# Patient Record
Sex: Female | Born: 1960 | Race: Black or African American | Hispanic: No | Smoking: Current every day smoker
Health system: Southern US, Community
[De-identification: ages and names within clinical notes are randomized; demographics above are authoritative.]

## PROBLEM LIST (undated history)

## (undated) DIAGNOSIS — J449 Chronic obstructive pulmonary disease, unspecified: Secondary | ICD-10-CM

## (undated) DIAGNOSIS — F99 Mental disorder, not otherwise specified: Secondary | ICD-10-CM

## (undated) DIAGNOSIS — I10 Essential (primary) hypertension: Secondary | ICD-10-CM

## (undated) DIAGNOSIS — F419 Anxiety disorder, unspecified: Secondary | ICD-10-CM

## (undated) DIAGNOSIS — I341 Nonrheumatic mitral (valve) prolapse: Secondary | ICD-10-CM

## (undated) DIAGNOSIS — M797 Fibromyalgia: Secondary | ICD-10-CM

## (undated) DIAGNOSIS — N83209 Unspecified ovarian cyst, unspecified side: Secondary | ICD-10-CM

## (undated) DIAGNOSIS — Z8719 Personal history of other diseases of the digestive system: Secondary | ICD-10-CM

## (undated) DIAGNOSIS — M1990 Unspecified osteoarthritis, unspecified site: Secondary | ICD-10-CM

## (undated) DIAGNOSIS — K219 Gastro-esophageal reflux disease without esophagitis: Secondary | ICD-10-CM

## (undated) DIAGNOSIS — E119 Type 2 diabetes mellitus without complications: Secondary | ICD-10-CM

## (undated) DIAGNOSIS — F32A Depression, unspecified: Secondary | ICD-10-CM

## (undated) DIAGNOSIS — F329 Major depressive disorder, single episode, unspecified: Secondary | ICD-10-CM

## (undated) DIAGNOSIS — E0590 Thyrotoxicosis, unspecified without thyrotoxic crisis or storm: Secondary | ICD-10-CM

## (undated) DIAGNOSIS — D219 Benign neoplasm of connective and other soft tissue, unspecified: Secondary | ICD-10-CM

## (undated) DIAGNOSIS — Z9889 Other specified postprocedural states: Secondary | ICD-10-CM

## (undated) DIAGNOSIS — D649 Anemia, unspecified: Secondary | ICD-10-CM

## (undated) DIAGNOSIS — R519 Headache, unspecified: Secondary | ICD-10-CM

## (undated) DIAGNOSIS — R51 Headache: Secondary | ICD-10-CM

## (undated) HISTORY — PX: NOSE SURGERY: SHX723

## (undated) HISTORY — PX: FOOT SURGERY: SHX648

## (undated) HISTORY — PX: ABDOMINAL HYSTERECTOMY: SHX81

## (undated) HISTORY — PX: THERAPEUTIC ABORTION: SHX798

## (undated) HISTORY — PX: HERNIA REPAIR: SHX51

## (undated) HISTORY — PX: BREAST SURGERY: SHX581

---

## 2007-03-24 DIAGNOSIS — Z8719 Personal history of other diseases of the digestive system: Secondary | ICD-10-CM

## 2007-03-24 HISTORY — DX: Personal history of other diseases of the digestive system: Z87.19

## 2016-10-12 DIAGNOSIS — I152 Hypertension secondary to endocrine disorders: Secondary | ICD-10-CM | POA: Insufficient documentation

## 2016-10-12 DIAGNOSIS — E119 Type 2 diabetes mellitus without complications: Secondary | ICD-10-CM | POA: Insufficient documentation

## 2016-10-12 DIAGNOSIS — F329 Major depressive disorder, single episode, unspecified: Secondary | ICD-10-CM | POA: Insufficient documentation

## 2016-10-12 DIAGNOSIS — F32A Depression, unspecified: Secondary | ICD-10-CM | POA: Insufficient documentation

## 2016-10-12 DIAGNOSIS — E1159 Type 2 diabetes mellitus with other circulatory complications: Secondary | ICD-10-CM | POA: Insufficient documentation

## 2016-10-12 DIAGNOSIS — F419 Anxiety disorder, unspecified: Secondary | ICD-10-CM | POA: Insufficient documentation

## 2016-10-12 DIAGNOSIS — F4310 Post-traumatic stress disorder, unspecified: Secondary | ICD-10-CM | POA: Insufficient documentation

## 2016-10-26 ENCOUNTER — Encounter (HOSPITAL_COMMUNITY): Payer: Self-pay | Admitting: *Deleted

## 2016-10-26 ENCOUNTER — Inpatient Hospital Stay (HOSPITAL_COMMUNITY): Payer: Medicare PPO

## 2016-10-26 ENCOUNTER — Inpatient Hospital Stay (HOSPITAL_COMMUNITY)
Admission: AD | Admit: 2016-10-26 | Discharge: 2016-10-26 | Disposition: A | Discharge status: Home / Self Care | Payer: Medicare PPO | Source: Ambulatory Visit | Attending: Obstetrics & Gynecology | Admitting: Obstetrics & Gynecology

## 2016-10-26 ENCOUNTER — Other Ambulatory Visit: Payer: Self-pay | Admitting: Obstetrics and Gynecology

## 2016-10-26 DIAGNOSIS — R109 Unspecified abdominal pain: Secondary | ICD-10-CM | POA: Diagnosis present

## 2016-10-26 DIAGNOSIS — N760 Acute vaginitis: Secondary | ICD-10-CM

## 2016-10-26 DIAGNOSIS — B9689 Other specified bacterial agents as the cause of diseases classified elsewhere: Secondary | ICD-10-CM

## 2016-10-26 DIAGNOSIS — F17210 Nicotine dependence, cigarettes, uncomplicated: Secondary | ICD-10-CM | POA: Diagnosis not present

## 2016-10-26 DIAGNOSIS — D259 Leiomyoma of uterus, unspecified: Secondary | ICD-10-CM | POA: Diagnosis present

## 2016-10-26 HISTORY — DX: Thyrotoxicosis, unspecified without thyrotoxic crisis or storm: E05.90

## 2016-10-26 HISTORY — DX: Mental disorder, not otherwise specified: F99

## 2016-10-26 HISTORY — DX: Unspecified ovarian cyst, unspecified side: N83.209

## 2016-10-26 HISTORY — DX: Benign neoplasm of connective and other soft tissue, unspecified: D21.9

## 2016-10-26 HISTORY — DX: Type 2 diabetes mellitus without complications: E11.9

## 2016-10-26 LAB — WET PREP, GENITAL
SPERM: NONE SEEN
TRICH WET PREP: NONE SEEN
YEAST WET PREP: NONE SEEN

## 2016-10-26 LAB — CBC
HCT: 29.1 % — ABNORMAL LOW (ref 36.0–46.0)
HEMOGLOBIN: 9.1 g/dL — AB (ref 12.0–15.0)
MCH: 22.1 pg — AB (ref 26.0–34.0)
MCHC: 31.3 g/dL (ref 30.0–36.0)
MCV: 70.8 fL — ABNORMAL LOW (ref 78.0–100.0)
Platelets: 142 10*3/uL — ABNORMAL LOW (ref 150–400)
RBC: 4.11 MIL/uL (ref 3.87–5.11)
RDW: 18.4 % — ABNORMAL HIGH (ref 11.5–15.5)
WBC: 9.3 10*3/uL (ref 4.0–10.5)

## 2016-10-26 LAB — URINALYSIS, ROUTINE W REFLEX MICROSCOPIC
Bilirubin Urine: NEGATIVE
Glucose, UA: NEGATIVE mg/dL
Hgb urine dipstick: NEGATIVE
KETONES UR: NEGATIVE mg/dL
LEUKOCYTES UA: NEGATIVE
NITRITE: NEGATIVE
PH: 5 (ref 5.0–8.0)
PROTEIN: NEGATIVE mg/dL
Specific Gravity, Urine: 1.023 (ref 1.005–1.030)

## 2016-10-26 MED ORDER — PROMETHAZINE HCL 12.5 MG PO TABS: 12.5000 mg | ORAL_TABLET | Freq: Four times a day (QID) | ORAL | 0 refills | Status: DC | PRN

## 2016-10-26 MED ORDER — HYDROMORPHONE HCL 1 MG/ML IJ SOLN
1.0000 mg | Freq: Once | INTRAMUSCULAR | Status: AC
  Administered 2016-10-26: 1 mg via INTRAMUSCULAR
  Filled 2016-10-26: qty 1

## 2016-10-26 MED ORDER — HYDROMORPHONE HCL 2 MG PO TABS: 2.0000 mg | ORAL_TABLET | Freq: Two times a day (BID) | ORAL | 0 refills | Status: DC | PRN

## 2016-10-26 MED ORDER — KETOROLAC TROMETHAMINE 60 MG/2ML IM SOLN
60.0000 mg | Freq: Once | INTRAMUSCULAR | Status: AC
  Administered 2016-10-26: 60 mg via INTRAMUSCULAR
  Filled 2016-10-26: qty 2

## 2016-10-26 NOTE — MAU Note (Signed)
Pt C/O constipation & then diarrhea that started last Thursday.  Was seen on [facility] on Friday, had CT scan - dx'd with fibroid tumors & ovarian cysts.  Was given pain meds & sent home, is still in excruciating pain.  Has appt with Dr. Julien Girt on August 30.  Denies bleeding.  Took one norco this morning with no relief.

## 2016-10-26 NOTE — MAU Provider Note (Signed)
History     CSN: [id number]  Arrival date and time: 10/26/16 1623   First Provider Initiated Contact with Patient 10/26/16 1757      Chief Complaint  Patient presents with  . Abdominal Pain   HPI   Ms.Sandra Bean is a 56 y.o. post menopausal female G3P0030 here in MAU with abdominal pain. LMP 2014.  She was seen at [facility] Friday for abdominal pain, and was told she has fibroid tumors and cysts on her ovaries. She had a CT scan only and states "they gave me minimal information". She was given Norco for pain and states its helping only minimally. Her last dose was Norco was 5:00 this evening. She took one tablet. She currently rates her pain 10/10. The pain is all over her abdomen, her abdomen is distended. She feels pressure in her pelvis.   She has an appointment scheduled with Dr. Julien Girt at the end of the month. She has never been to their office.   OB History    Gravida Para Term Preterm AB Living   3       3     SAB TAB Ectopic Multiple Live Births     3            Past Medical History:  Diagnosis Date  . Diabetes mellitus without complication (HCC)    Type 2  . Fibroids   . Hyperthyroidism   . Mental disorder   . Ovarian cyst     Past Surgical History:  Procedure Laterality Date  . FOOT SURGERY    . HERNIA REPAIR    . NOSE SURGERY    . THERAPEUTIC ABORTION      History reviewed. No pertinent family history.  Social History  Substance Use Topics  . Smoking status: Current Every Day Smoker    Packs/day: 0.50    Types: Cigarettes  . Smokeless tobacco: Never Used  . Alcohol use No    Allergies: No Known Allergies  No prescriptions prior to admission.   Results for orders placed or performed during the hospital encounter of 10/26/16 (from the past 48 hour(s))  Urinalysis, Routine w reflex microscopic     Status: None   Collection Time: 10/26/16  4:45 PM  Result Value Ref Range   Color, Urine YELLOW YELLOW   APPearance CLEAR CLEAR   Specific Gravity,  Urine 1.023 1.005 - 1.030   pH 5.0 5.0 - 8.0   Glucose, UA NEGATIVE NEGATIVE mg/dL   Hgb urine dipstick NEGATIVE NEGATIVE   Bilirubin Urine NEGATIVE NEGATIVE   Ketones, ur NEGATIVE NEGATIVE mg/dL   Protein, ur NEGATIVE NEGATIVE mg/dL   Nitrite NEGATIVE NEGATIVE   Leukocytes, UA NEGATIVE NEGATIVE  CBC     Status: Abnormal   Collection Time: 10/26/16  6:22 PM  Result Value Ref Range   WBC 9.3 4.0 - 10.5 K/uL   RBC 4.11 3.87 - 5.11 MIL/uL   Hemoglobin 9.1 (L) 12.0 - 15.0 g/dL   HCT 29.1 (L) 36.0 - 46.0 %   MCV 70.8 (L) 78.0 - 100.0 fL   MCH 22.1 (L) 26.0 - 34.0 pg   MCHC 31.3 30.0 - 36.0 g/dL   RDW 18.4 (H) 11.5 - 15.5 %   Platelets 142 (L) 150 - 400 K/uL    Comment: REPEATED TO VERIFY SPECIMEN CHECKED FOR CLOTS   Wet prep, genital     Status: Abnormal   Collection Time: 10/26/16  6:58 PM  Result Value Ref Range   Yeast  Wet Prep HPF POC NONE SEEN NONE SEEN   Trich, Wet Prep NONE SEEN NONE SEEN   Clue Cells Wet Prep HPF POC PRESENT (A) NONE SEEN   WBC, Wet Prep HPF POC FEW (A) NONE SEEN    Comment: FEW BACTERIA SEEN Specimen diluted due to transport tube containing more than 1 ml of saline, interpret results with caution.    Sperm NONE SEEN    Review of Systems  Constitutional: Negative for fever.  Gastrointestinal: Positive for abdominal pain.  Genitourinary: Positive for pelvic pain. Negative for dysuria and vaginal bleeding.   Physical Exam   Blood pressure 137/73, pulse 80, temperature 97.7 F (36.5 C), temperature source Oral, resp. rate 18, height 5\' 6"  (1.676 m), weight 176 lb (79.8 kg).  Physical Exam  Constitutional: She is oriented to person, place, and time. She appears well-developed and well-nourished.  Non-toxic appearance. She does not have a sickly appearance. She does not appear ill. No distress.  GI: Soft. She exhibits distension and mass. There is generalized tenderness.  Genitourinary:  Genitourinary Comments: Vagina - Small amount of white vaginal  discharge, no odor  Cervix - No contact bleeding, no active bleeding  Bimanual exam: Cervix closed, no CMT  Uterine tenderness, + enlarged  Adnexa non tender, no masses bilaterally GC/Chlam, wet prep done Chaperone present for exam.   Musculoskeletal: Normal range of motion.  Neurological: She is alert and oriented to person, place, and time.  Skin: Skin is warm. She is not diaphoretic.  Psychiatric: Her behavior is normal.   MAU Course  Procedures  None  MDM  CBC with diff Dilaudid 1 mg IM & Toradol 60 mg IM  Pelvic US ordered; patient rating her pain 10/10 Report given to [place] who resumes care of the patient.  Noni Saupe I, NP  Results for orders placed or performed during the hospital encounter of 10/26/16 (from the past 24 hour(s))  Urinalysis, Routine w reflex microscopic     Status: None   Collection Time: 10/26/16  4:45 PM  Result Value Ref Range   Color, Urine YELLOW YELLOW   APPearance CLEAR CLEAR   Specific Gravity, Urine 1.023 1.005 - 1.030   pH 5.0 5.0 - 8.0   Glucose, UA NEGATIVE NEGATIVE mg/dL   Hgb urine dipstick NEGATIVE NEGATIVE   Bilirubin Urine NEGATIVE NEGATIVE   Ketones, ur NEGATIVE NEGATIVE mg/dL   Protein, ur NEGATIVE NEGATIVE mg/dL   Nitrite NEGATIVE NEGATIVE   Leukocytes, UA NEGATIVE NEGATIVE  CBC     Status: Abnormal   Collection Time: 10/26/16  6:22 PM  Result Value Ref Range   WBC 9.3 4.0 - 10.5 K/uL   RBC 4.11 3.87 - 5.11 MIL/uL   Hemoglobin 9.1 (L) 12.0 - 15.0 g/dL   HCT 29.1 (L) 36.0 - 46.0 %   MCV 70.8 (L) 78.0 - 100.0 fL   MCH 22.1 (L) 26.0 - 34.0 pg   MCHC 31.3 30.0 - 36.0 g/dL   RDW 18.4 (H) 11.5 - 15.5 %   Platelets 142 (L) 150 - 400 K/uL  Wet prep, genital     Status: Abnormal   Collection Time: 10/26/16  6:58 PM  Result Value Ref Range   Yeast Wet Prep HPF POC NONE SEEN NONE SEEN   Trich, Wet Prep NONE SEEN NONE SEEN   Clue Cells Wet Prep HPF POC PRESENT (A) NONE SEEN   WBC, Wet Prep HPF POC FEW (A) NONE SEEN    Sperm NONE SEEN  US Transvaginal Non-ob  Result Date: 10/26/2016 CLINICAL DATA:  Pelvic pain.  Uterine fibroids. EXAM: TRANSABDOMINAL AND TRANSVAGINAL ULTRASOUND OF PELVIS TECHNIQUE: Both transabdominal and transvaginal ultrasound examinations of the pelvis were performed. Transabdominal technique was performed for global imaging of the pelvis including uterus, ovaries, adnexal regions, and pelvic cul-de-sac. It was necessary to proceed with endovaginal exam following the transabdominal exam to visualize the ovaries. COMPARISON:  None. FINDINGS: Uterus Measurements: 11.6 x 7.9 x 6.6 cm. Two large uterine fibroids are noted, with the largest measuring 8.5 x 7.3 x 6.6 cm arising from the fundus. The other fibroid measures 6.1 x 5.3 x 4.9 cm in the lower uterine segment. Endometrium Could not be visualized due to large uterine fibroids. Right ovary Not visualized. Left ovary Not visualized. Other findings Small amount of free fluid is noted which most likely is physiologic. IMPRESSION: Large uterine fibroids are noted, with the largest measuring 8.5 cm arising from the fundus. Endometrium and ovaries are not visualized. Electronically Signed   By: Marijo Conception, M.D.   On: 10/26/2016 19:56   US Pelvis Complete  Result Date: 10/26/2016 CLINICAL DATA:  Pelvic pain.  Uterine fibroids. EXAM: TRANSABDOMINAL AND TRANSVAGINAL ULTRASOUND OF PELVIS TECHNIQUE: Both transabdominal and transvaginal ultrasound examinations of the pelvis were performed. Transabdominal technique was performed for global imaging of the pelvis including uterus, ovaries, adnexal regions, and pelvic cul-de-sac. It was necessary to proceed with endovaginal exam following the transabdominal exam to visualize the ovaries. COMPARISON:  None. FINDINGS: Uterus Measurements: 11.6 x 7.9 x 6.6 cm. Two large uterine fibroids are noted, with the largest measuring 8.5 x 7.3 x 6.6 cm arising from the fundus. The other fibroid measures 6.1 x 5.3 x 4.9 cm  in the lower uterine segment. Endometrium Could not be visualized due to large uterine fibroids. Right ovary Not visualized. Left ovary Not visualized. Other findings Small amount of free fluid is noted which most likely is physiologic. IMPRESSION: Large uterine fibroids are noted, with the largest measuring 8.5 cm arising from the fundus. Endometrium and ovaries are not visualized. Electronically Signed   By: Marijo Conception, M.D.   On: 10/26/2016 19:56   Patient able to tolerate crackers and chicken and rice soup prior to d/c home  Assessment and Plan  Abdominal pain in female - Rx for Dilaudid 2 mg po every 12 hrs - Rx Phenergan 12.5 mg po every 6 hrs  Uterine leiomyoma, unspecified location - Message sent to [place] to get scheduled sooner than 8/30 - If unable to get sooner appt, pt to decide of care provider (Dr. Julien Girt or [facility] MD) - Advised to return here for worsening sx's  Discharge home Patient verbalized an understanding of the plan of care and agrees.  Laury Deep, CNM 10/26/2016, 9:16 PM

## 2016-10-27 ENCOUNTER — Telehealth: Payer: Self-pay | Admitting: General Practice

## 2016-10-27 LAB — GC/CHLAMYDIA PROBE AMP (~~LOC~~) NOT AT ARMC
Chlamydia: NEGATIVE
Neisseria Gonorrhea: NEGATIVE

## 2016-10-27 LAB — HIV ANTIBODY (ROUTINE TESTING W REFLEX): HIV Screen 4th Generation wRfx: NONREACTIVE

## 2016-10-27 MED ORDER — METRONIDAZOLE 500 MG PO TABS: 500.0000 mg | ORAL_TABLET | Freq: Two times a day (BID) | ORAL | 0 refills | Status: DC

## 2016-10-27 NOTE — Telephone Encounter (Signed)
Med called in to [place]. Called patient, no answer- left message stating we are trying to reach you to inform you of a needed prescription sent to your [facility] pharmacy, you may call us back if you have questions

## 2016-10-27 NOTE — Telephone Encounter (Signed)
-----   Message from Laury Deep, [place] sent at 10/26/2016 10:25 PM EDT ----- Regarding: Needs Rx for Flagyl This patient will be coming to see Dr. Ihor Dow on 10/29/16.  Please make sure she gets a Rx for Flagyl sent.  I was so focused on sending Rx for her pain, I missed that Rx.  It would not let me e-prescribe the Rx. Thanks!

## 2016-10-29 ENCOUNTER — Other Ambulatory Visit (HOSPITAL_COMMUNITY)
Admission: RE | Admit: 2016-10-29 | Discharge: 2016-10-29 | Disposition: A | Discharge status: Home / Self Care | Payer: Medicare PPO | Source: Ambulatory Visit | Attending: Obstetrics & Gynecology | Admitting: Obstetrics & Gynecology

## 2016-10-29 ENCOUNTER — Ambulatory Visit: Payer: Self-pay | Admitting: Clinical

## 2016-10-29 ENCOUNTER — Encounter: Payer: Self-pay | Admitting: Obstetrics & Gynecology

## 2016-10-29 ENCOUNTER — Ambulatory Visit (INDEPENDENT_AMBULATORY_CARE_PROVIDER_SITE_OTHER): Payer: Medicare PPO | Admitting: Obstetrics & Gynecology

## 2016-10-29 ENCOUNTER — Encounter (HOSPITAL_COMMUNITY): Payer: Self-pay

## 2016-10-29 VITALS — BP 137/99 | HR 74 | Wt 176.1 lb

## 2016-10-29 DIAGNOSIS — D250 Submucous leiomyoma of uterus: Secondary | ICD-10-CM | POA: Diagnosis not present

## 2016-10-29 DIAGNOSIS — Z1151 Encounter for screening for human papillomavirus (HPV): Secondary | ICD-10-CM | POA: Diagnosis not present

## 2016-10-29 DIAGNOSIS — Z124 Encounter for screening for malignant neoplasm of cervix: Secondary | ICD-10-CM | POA: Diagnosis not present

## 2016-10-29 DIAGNOSIS — R102 Pelvic and perineal pain: Secondary | ICD-10-CM | POA: Diagnosis not present

## 2016-10-29 DIAGNOSIS — Z01419 Encounter for gynecological examination (general) (routine) without abnormal findings: Secondary | ICD-10-CM

## 2016-10-29 DIAGNOSIS — Z029 Encounter for administrative examinations, unspecified: Secondary | ICD-10-CM

## 2016-10-29 DIAGNOSIS — Z113 Encounter for screening for infections with a predominantly sexual mode of transmission: Secondary | ICD-10-CM

## 2016-10-29 DIAGNOSIS — D252 Subserosal leiomyoma of uterus: Secondary | ICD-10-CM

## 2016-10-29 DIAGNOSIS — F3289 Other specified depressive episodes: Secondary | ICD-10-CM

## 2016-10-29 DIAGNOSIS — F39 Unspecified mood [affective] disorder: Secondary | ICD-10-CM

## 2016-10-29 NOTE — BH Specialist Note (Signed)
Integrated Behavioral Health Initial Visit  MRN: [id number] Name: Sandra Bean   Session Start time: 3:05 Session End time: 3:20 Total time: 15 minutes  Type of Service: [place] Interpretor:No. Interpretor Name and Language: n/a   Warm Hand Off Completed.       SUBJECTIVE: Sandra Bean is a 56 y.o. female accompanied by patient and adult neice. Patient was referred by Dr Ihor Dow for mood disorder, untreated. Patient reports the following symptoms/concerns: Pt states that her primary concern today is the need to find a local therapist, who will work with her new psychiatrist;also finds sound of the ocean soothing. Duration of problem: Four months; Severity of problem: moderate  OBJECTIVE: Mood: Anxious and Affect: Appropriate Risk of harm to self or others: No plan to harm self or others   LIFE CONTEXT: Family and Social: Lives with her adult niece School/Work: - Self-Care: Self-advocacy Life Changes: Move from [place] to [place] about 4 months prior  GOALS ADDRESSED: Patient will reduce symptoms of: mood instability and increase knowledge and/or ability of: stress reduction and also: Increase healthy adjustment to current life circumstances   INTERVENTIONS: Supportive Counseling and Psychoeducation and/or Health Education  Standardized Assessments completed: GAD-7 and PHQ 9, pt states no SI today  ASSESSMENT: Patient currently experiencing Mood disorder. Patient may benefit from psychoeducation and supportive counseling today.  PLAN: 1. Follow up with behavioral health clinician on : As needed 2. Behavioral recommendations:  -Go to psychiatry appointment with [facility] on 11-16-16; psychiatrist will give new therapist referral at psychiatry appointment -[place] on phone today for ocean sound; niece will help, if needed -Consider emergency numbers provided to patient and niece, if needed  prior to psychiatry visit 3. Referral(s): [facility] (In Clinic) 4. "From scale of 1-10, how likely are you to follow plan?": 9  Garlan Fair, LCSWA  Depression screen [facility] 2/9 10/29/2016  Decreased Interest 3  Down, Depressed, Hopeless 3  PHQ - 2 Score 6  Altered sleeping 3  Tired, decreased energy 3  Change in appetite 3  Feeling bad or failure about yourself  3  Trouble concentrating 3  Moving slowly or fidgety/restless 2  Suicidal thoughts 1  PHQ-9 Score 24   GAD 7 : Generalized Anxiety Score 10/29/2016  Nervous, Anxious, on Edge 3  Control/stop worrying 3  Worry too much - different things 3  Trouble relaxing 3  Restless 3  Easily annoyed or irritable 2  Afraid - awful might happen 3  Total GAD 7 Score 20

## 2016-10-29 NOTE — Progress Notes (Signed)
Subjective:     Sandra Bean is a 56 y.o. female here for a routine exam.  G3P0030 LMP 2013 Current complaints: pain.  Pt reports pain beginning  6 days prev. Pt reports diarrhea the night.Prior to the pain. Pt with a h/o pancreatitis.  She reports that it greatly limits her activity. She was in the ED x2. She was seen at [facility] ED on Fri night and then at the MAU on Monday. She was taking Narcs with no relief.  No fevers. NO chills. The diarrhea is resolved. Pt reports a decreased appetite. Have been eating 'lightweight'  She is not currently sexually active and has not been in over 2 years.  She denies fever or chills.  Pt reports being on disability due to major depression. She is looking to getting to see a psychiatrist and a therapist. Would like to see the behavior health specialist prior to leaving today.   She is here today with her niece  No healthcare unless absolutely necessary from 2016-2018 due to depression.   Gynecologic History No LMP recorded. Patient is postmenopausal. Contraception: post menopausal status Last Pap: 2016. Results were: normal. h/o abnormal PAP ->10years s/p cryo.  Last mammogram: 2016. Results were: normal  Obstetric History OB History  Gravida Para Term Preterm AB Living  3       3    SAB TAB Ectopic Multiple Live Births    3          # Outcome Date GA Lbr Len/2nd Weight Sex Delivery Anes PTL Lv  3 TAB           2 TAB           1 TAB              The following portions of the patient's history were reviewed and updated as appropriate: allergies, current medications, past family history, past medical history, past social history, past surgical history and problem list.  Review of Systems Pertinent items are noted in HPI.    Objective:  BP (!) 137/99   Pulse 74   Wt 176 lb 1.6 oz (79.9 kg)   BMI 28.42 kg/m  General Appearance:    Alert, cooperative, no distress, appears stated age  Head:    Normocephalic, without obvious abnormality,  atraumatic  Eyes:    conjunctiva/corneas clear, EOM's intact, both eyes  Ears:    Normal external ear canals, both ears  Nose:   Nares normal, septum midline, mucosa normal, no drainage    or sinus tenderness  Throat:   Lips, mucosa, and tongue normal; teeth and gums normal  Neck:   Supple, symmetrical, trachea midline, no adenopathy;    thyroid:  no enlargement/tenderness/nodules  Back:     Symmetric, no curvature, ROM normal, no CVA tenderness  Lungs:     Clear to auscultation bilaterally, respirations unlabored  Chest Wall:    No tenderness or deformity   Heart:    Regular rate and rhythm, S1 and S2 normal, no murmur, rub   or gallop  Breast Exam:    No tenderness, masses, or nipple abnormality  Abdomen:     Soft, tender diffusely- no rebound, bowel sounds active all four quadrants,    no masses, no organomegaly  Genitalia:    Normal female without lesion or discharge. There is uterine tenderness which is marked over the fibroids that I felt mail ny right lower uterine segment. No adnexal masses or tenderness     Extremities:  Extremities normal, atraumatic, no cyanosis or edema  Pulses:   2+ and symmetric all extremities  Skin:   Skin color, texture, turgor normal, no rashes or lesions   10/26/2016 CLINICAL DATA:  Pelvic pain.  Uterine fibroids.  EXAM: TRANSABDOMINAL AND TRANSVAGINAL ULTRASOUND OF PELVIS  TECHNIQUE: Both transabdominal and transvaginal ultrasound examinations of the pelvis were performed. Transabdominal technique was performed for global imaging of the pelvis including uterus, ovaries, adnexal regions, and pelvic cul-de-sac. It was necessary to proceed with endovaginal exam following the transabdominal exam to visualize the ovaries.  COMPARISON:  None.  FINDINGS: Uterus  Measurements: 11.6 x 7.9 x 6.6 cm. Two large uterine fibroids are noted, with the largest measuring 8.5 x 7.3 x 6.6 cm arising from the fundus. The other fibroid measures 6.1 x 5.3 x  4.9 cm in the lower uterine segment.  Endometrium  Could not be visualized due to large uterine fibroids.  Right ovary  Not visualized.  Left ovary  Not visualized.  Other findings  Small amount of free fluid is noted which most likely is physiologic.  IMPRESSION: Large uterine fibroids are noted, with the largest measuring 8.5 cm arising from the fundus. Endometrium and ovaries are not visualized.  Assessment:    Healthy female exam.   BV (dx'd in the MAU- pt did not take meds) Uterine fibroids Pelvic pain- suspect degerating fibroids. Pt declines conservative management. Requests definitive tx     Plan:   F/u PAP Flagyl 500mg  bid x7 days (pt will pick up the Rx) May have a refill for Dilaudid po (pt did not get Rx before leaving office) will send a note to nursing to call her to pick up rx.  Patient desires surgical management with RATH with BSO.  The risks of surgery were discussed in detail with the patient including but not limited to: bleeding which may require transfusion or reoperation; infection which may require prolonged hospitalization or re-hospitalization and antibiotic therapy; injury to bowel, bladder, ureters and major vessels or other surrounding organs; need for additional procedures including laparotomy; thromboembolic phenomenon, incisional problems and other postoperative or anesthesia complications.  Patient was told that the likelihood that her condition and symptoms will be treated effectively with this surgical management was very high; the postoperative expectations were also discussed in detail. The patient also understands the alternative treatment options which were discussed in full. All questions were answered.  She was told that she will be contacted by our surgical scheduler regarding the time and date of her surgery; routine preoperative instructions of having nothing to eat or drink after midnight on the day prior to surgery and also  coming to the hospital 1 1/2 hours prior to her time of surgery were also emphasized.  She was told she may be called for a preoperative appointment about a week prior to surgery and will be given further preoperative instructions at that visit. Printed patient education handouts about the procedure were given to the patient to review at home.  She was asked to f/u to discuss the surgery prior to the date of the procedure.   Total face-to-face time with patient was 50  min.  Greater than 50% was spent in counseling and coordination of care with the patient.    Velna Hedgecock L. Harraway-Smith, M.D., [name]

## 2016-10-29 NOTE — Patient Instructions (Signed)
Uterine Fibroids Uterine fibroids are tissue masses (tumors) that can develop in the womb (uterus). They are also called leiomyomas. This type of tumor is not cancerous (benign) and does not spread to other parts of the body outside of the pelvic area, which is between the hip bones. Occasionally, fibroids may develop in the fallopian tubes, in the cervix, or on the support structures (ligaments) that surround the uterus. You can have one or many fibroids. Fibroids can vary in size, weight, and where they grow in the uterus. Some can become quite large. Most fibroids do not require medical treatment. What are the causes? A fibroid can develop when a single uterine cell keeps growing (replicating). Most cells in the human body have a control mechanism that keeps them from replicating without control. What are the signs or symptoms? Symptoms may include:  Heavy bleeding during your period.  Bleeding or spotting between periods.  Pelvic pain and pressure.  Bladder problems, such as needing to urinate more often (urinary frequency) or urgently.  Inability to reproduce offspring (infertility).  Miscarriages.  How is this diagnosed? Uterine fibroids are diagnosed through a physical exam. Your health care provider may feel the lumpy tumors during a pelvic exam. Ultrasonography and an MRI may be done to determine the size, location, and number of fibroids. How is this treated? Treatment may include:  Watchful waiting. This involves getting the fibroid checked by your health care provider to see if it grows or shrinks. Follow your health care provider's recommendations for how often to have this checked.  Hormone medicines. These can be taken by mouth or given through an intrauterine device (IUD).  Surgery. ? Removing the fibroids (myomectomy) or the uterus (hysterectomy). ? Removing blood supply to the fibroids (uterine artery embolization).  If fibroids interfere with your fertility and you  want to become pregnant, your health care provider may recommend having the fibroids removed. Follow these instructions at home:  Keep all follow-up visits as directed by your health care provider. This is important.  Take over-the-counter and prescription medicines only as told by your health care provider. ? If you were prescribed a hormone treatment, take the hormone medicines exactly as directed.  Ask your health care provider about taking iron pills and increasing the amount of dark green, leafy vegetables in your diet. These actions can help to boost your blood iron levels, which may be affected by heavy menstrual bleeding.  Pay close attention to your period and tell your health care provider about any changes, such as: ? Increased blood flow that requires you to use more pads or tampons than usual per month. ? A change in the number of days that your period lasts per month. ? A change in symptoms that are associated with your period, such as abdominal cramping or back pain. Contact a health care provider if:  You have pelvic pain, back pain, or abdominal cramps that cannot be controlled with medicines.  You have an increase in bleeding between and during periods.  You soak tampons or pads in a half hour or less.  You feel lightheaded, extra tired, or weak. Get help right away if:  You faint.  You have a sudden increase in pelvic pain. This information is not intended to replace advice given to you by your health care provider. Make sure you discuss any questions you have with your health care provider. Document Released: 03/06/2000 Document Revised: 11/07/2015 Document Reviewed: 09/05/2013 Elsevier Interactive Patient Education  2018 [employer]  

## 2016-10-29 NOTE — Progress Notes (Signed)
Unsure of last pap smear

## 2016-10-30 ENCOUNTER — Encounter: Payer: Self-pay | Admitting: Obstetrics & Gynecology

## 2016-11-05 ENCOUNTER — Telehealth: Payer: Self-pay | Admitting: Obstetrics & Gynecology

## 2016-11-05 NOTE — Telephone Encounter (Signed)
Patient said she can't come pick her RX up today, also want a work note, She have a few questions for the nurse.

## 2016-11-06 ENCOUNTER — Telehealth: Payer: Self-pay | Admitting: Lab

## 2016-11-06 LAB — CYTOLOGY - PAP
CHLAMYDIA, DNA PROBE: NEGATIVE
Diagnosis: NEGATIVE
HPV: NOT DETECTED
NEISSERIA GONORRHEA: NEGATIVE

## 2016-11-10 NOTE — Telephone Encounter (Signed)
Error

## 2016-11-10 NOTE — Telephone Encounter (Addendum)
Pt called today to discuss her concerns. She stated that she works @ [employer] and her job is very physical.  She is required to [employer] - sometimes having to sit on the floor, lift and carry heavy boxes and stand on her feet for long periods of time. She further states that she is not able to perform any of these job duties without experiencing significant pain and discomfort. The level of pain while working would require her to need Dilaudid. She cannot take Dilaudid and be able to work safely. she is scheduled for surgery on 01/19/17.  She is requesting a letter from Dr. Ihor Dow for her job which states that she is unable to work now due to the pain, that she will be having surgery on 01/19/17 and continue to be out of work for 6 weeks for recovery. Pt also stated that she would need FMLA papers completed. I asked if pt could request to be transferred to a different department @ work which would not have such physical responsibilities. Pt stated, "That's not how [employer] works. They are not very caring to their employees."  I advised pt that I did not expect Dr. Ihor Dow to authorize a medical leave prior to surgery for the next 2 months. Pt then stated, "She saw how much pain I was having when I was there for the appointment. She can call me if she needs to discuss it further with me".  I informed pt that I will send a message to Dr. Ihor Dow and that she is out of the office until next week. Pt agreed and voiced understanding.   8/27  1405  Called pt and left message stating that I have had a response from the doctor regarding our previous conversation. Please call back and leave a message stating whether a detailed message can be left on her voice mail. She can speak with any of the nurses. Per Dr. Ihor Dow, she cannot remove her from work for 2 months until the surgery date. We can fill out her papers asking for light duty. It is up to her job to accommodate based on the  information we give them.

## 2016-11-20 NOTE — Telephone Encounter (Signed)
Called patient and she states that someone already called her back and discussed it with her. Patient states she is just going to have to leave her job and find another one after all this because she cannot work. Patient asked if Dr Ihor Dow was going to remove her cervix. Told patient I wasn't sure but she could ask Dr Ihor Dow the day of surgery. Patient verbalized understanding & had no questions

## 2016-12-08 ENCOUNTER — Telehealth: Payer: Self-pay | Admitting: *Deleted

## 2016-12-08 NOTE — Telephone Encounter (Signed)
Pt called and requested a letter for light duty at work.

## 2016-12-09 NOTE — Telephone Encounter (Signed)
Contacted Dr. Ihor Dow and was recommended that pt can have a light duty work letter stating no lifting greater than 30 lbs, no stooping, and no bending. Notified pt that the light duty letter she requested has been faxed to [facility] personnel @ [phone].  Pt stated "thank you" with no further questions.

## 2016-12-10 ENCOUNTER — Telehealth: Payer: Self-pay | Admitting: *Deleted

## 2016-12-10 NOTE — Telephone Encounter (Signed)
-----   Message from Maurine Minister, [place] sent at 12/10/2016 11:23 AM EDT ----- Contact: [phone] Hi Ladies:  Sandra Bean stated that she was told that a prescription (pain meds) would be ready for her to pick up at the front desk.  Can someone please check on this and give patient a call back.  Thank you

## 2016-12-10 NOTE — Telephone Encounter (Signed)
I called Sandra Bean and left a message I was returning her call, please call our office if you are still having an issue.

## 2016-12-14 NOTE — Telephone Encounter (Signed)
Patient's job called and left message asking about work restrictions and when the end date was. Per chart review, patient hasn't signed ROI. Called patient & phone continues to ring then stops without voicemail.

## 2017-01-04 ENCOUNTER — Telehealth: Payer: Self-pay | Admitting: General Practice

## 2017-01-04 NOTE — Patient Instructions (Addendum)
Your procedure is scheduled on: Tuesday October 30,2018 at 1:30 pm  Enter through the Main Entrance of [facility] at:12:00 pm  Pick up the phone at the desk and dial [phone].  Call this number if you have problems the morning of surgery: [phone].  Remember: Do NOT eat food: after Midnight on Monday October 29 Do NOT drink clear liquids after: 7:30 am day of surgery Take these medicines the morning of surgery with a SIP OF WATER: AMILOPDINE,DESVENLAFAXINE CLONAZEPAM, METAPROPOL PRIST IQ, VOLTAREN, PRILOSEC  HOLD METFORMIN FOR 24 HOURS PRIOR TO SUGERY  STOP ASPIRIN ONE DAY PRIOR TO SURGERY  Do NOT wear jewelry (body piercing), metal hair clips/bobby pins, make-up, or nail polish. Do NOT wear lotions, powders, or perfumes.  You may wear deoderant. Do NOT shave for 48 hours prior to surgery. Do NOT bring valuables to the hospital. Contacts, dentures, or bridgework may not be worn into surgery.  Leave suitcase in car.  After surgery it may be brought to your room.   For patients admitted to the hospital, checkout time is 11:00 AM the day of discharge.

## 2017-01-04 NOTE — Telephone Encounter (Signed)
Patient called into front office and requested call back regarding fmla paperwork. Called patient and she states that [employer] disability needs an end date on her letter that was given. Told patient we cannot discuss that or her medical history without a written release of information. Told patient she will need to come by the office to fill that out and if we need to fill out FMLA paperwork for her surgery, we would need that release anyway for that. Patient verbalized understanding and states she will come by tomorrow for that. Patient had no questions

## 2017-01-06 ENCOUNTER — Encounter (HOSPITAL_COMMUNITY)
Admission: RE | Admit: 2017-01-06 | Discharge: 2017-01-06 | Disposition: A | Discharge status: Home / Self Care | Payer: Medicare PPO | Source: Ambulatory Visit | Attending: Obstetrics & Gynecology | Admitting: Obstetrics & Gynecology

## 2017-01-06 ENCOUNTER — Encounter (HOSPITAL_COMMUNITY): Payer: Self-pay

## 2017-01-06 ENCOUNTER — Other Ambulatory Visit: Payer: Self-pay

## 2017-01-06 DIAGNOSIS — R9431 Abnormal electrocardiogram [ECG] [EKG]: Secondary | ICD-10-CM | POA: Insufficient documentation

## 2017-01-06 DIAGNOSIS — Z01810 Encounter for preprocedural cardiovascular examination: Secondary | ICD-10-CM | POA: Insufficient documentation

## 2017-01-06 DIAGNOSIS — Z01812 Encounter for preprocedural laboratory examination: Secondary | ICD-10-CM | POA: Diagnosis present

## 2017-01-06 HISTORY — DX: Personal history of other diseases of the digestive system: Z87.19

## 2017-01-06 HISTORY — DX: Unspecified osteoarthritis, unspecified site: M19.90

## 2017-01-06 HISTORY — DX: Depression, unspecified: F32.A

## 2017-01-06 HISTORY — DX: Chronic obstructive pulmonary disease, unspecified: J44.9

## 2017-01-06 HISTORY — DX: Anemia, unspecified: D64.9

## 2017-01-06 HISTORY — DX: Headache, unspecified: R51.9

## 2017-01-06 HISTORY — DX: Other specified postprocedural states: Z98.890

## 2017-01-06 HISTORY — DX: Essential (primary) hypertension: I10

## 2017-01-06 HISTORY — DX: Fibromyalgia: M79.7

## 2017-01-06 HISTORY — DX: Gastro-esophageal reflux disease without esophagitis: K21.9

## 2017-01-06 HISTORY — DX: Major depressive disorder, single episode, unspecified: F32.9

## 2017-01-06 HISTORY — DX: Anxiety disorder, unspecified: F41.9

## 2017-01-06 HISTORY — DX: Headache: R51

## 2017-01-06 HISTORY — DX: Nonrheumatic mitral (valve) prolapse: I34.1

## 2017-01-06 LAB — CBC
HEMATOCRIT: 31.8 % — AB (ref 36.0–46.0)
Hemoglobin: 9.7 g/dL — ABNORMAL LOW (ref 12.0–15.0)
MCH: 21.3 pg — ABNORMAL LOW (ref 26.0–34.0)
MCHC: 30.5 g/dL (ref 30.0–36.0)
MCV: 69.9 fL — ABNORMAL LOW (ref 78.0–100.0)
PLATELETS: 159 10*3/uL (ref 150–400)
RBC: 4.55 MIL/uL (ref 3.87–5.11)
RDW: 18.4 % — AB (ref 11.5–15.5)
WBC: 4.7 10*3/uL (ref 4.0–10.5)

## 2017-01-06 LAB — ABO/RH: ABO/RH(D): O NEG

## 2017-01-06 LAB — PREPARE RBC (CROSSMATCH)

## 2017-01-06 NOTE — Pre-Procedure Instructions (Signed)
DR. FOSTER VIEWED AND OKAY' D EKG 

## 2017-01-19 ENCOUNTER — Encounter (HOSPITAL_COMMUNITY)
Admission: AD | Disposition: A | Discharge status: Home / Self Care | Payer: Self-pay | Source: Ambulatory Visit | Attending: Obstetrics & Gynecology

## 2017-01-19 ENCOUNTER — Encounter (HOSPITAL_COMMUNITY): Payer: Self-pay | Admitting: Certified Registered Nurse Anesthetist

## 2017-01-19 ENCOUNTER — Ambulatory Visit (HOSPITAL_COMMUNITY): Payer: Medicare Other | Admitting: Certified Registered Nurse Anesthetist

## 2017-01-19 ENCOUNTER — Inpatient Hospital Stay (HOSPITAL_COMMUNITY)
Admission: AD | Admit: 2017-01-19 | Discharge: 2017-01-20 | DRG: 743 | Disposition: A | Discharge status: Home / Self Care | Payer: Medicare Other | Source: Ambulatory Visit | Attending: Obstetrics & Gynecology | Admitting: Obstetrics & Gynecology

## 2017-01-19 DIAGNOSIS — Z9889 Other specified postprocedural states: Secondary | ICD-10-CM

## 2017-01-19 DIAGNOSIS — R109 Unspecified abdominal pain: Secondary | ICD-10-CM | POA: Diagnosis present

## 2017-01-19 DIAGNOSIS — F17219 Nicotine dependence, cigarettes, with unspecified nicotine-induced disorders: Secondary | ICD-10-CM | POA: Insufficient documentation

## 2017-01-19 DIAGNOSIS — D638 Anemia in other chronic diseases classified elsewhere: Secondary | ICD-10-CM | POA: Diagnosis not present

## 2017-01-19 DIAGNOSIS — D251 Intramural leiomyoma of uterus: Secondary | ICD-10-CM

## 2017-01-19 DIAGNOSIS — D252 Subserosal leiomyoma of uterus: Secondary | ICD-10-CM | POA: Diagnosis not present

## 2017-01-19 DIAGNOSIS — F17210 Nicotine dependence, cigarettes, uncomplicated: Secondary | ICD-10-CM | POA: Diagnosis not present

## 2017-01-19 DIAGNOSIS — Z720 Tobacco use: Secondary | ICD-10-CM

## 2017-01-19 DIAGNOSIS — N760 Acute vaginitis: Secondary | ICD-10-CM

## 2017-01-19 DIAGNOSIS — D250 Submucous leiomyoma of uterus: Secondary | ICD-10-CM

## 2017-01-19 DIAGNOSIS — I10 Essential (primary) hypertension: Secondary | ICD-10-CM | POA: Diagnosis not present

## 2017-01-19 DIAGNOSIS — E119 Type 2 diabetes mellitus without complications: Secondary | ICD-10-CM | POA: Diagnosis not present

## 2017-01-19 DIAGNOSIS — D259 Leiomyoma of uterus, unspecified: Secondary | ICD-10-CM | POA: Diagnosis present

## 2017-01-19 DIAGNOSIS — F419 Anxiety disorder, unspecified: Secondary | ICD-10-CM | POA: Diagnosis not present

## 2017-01-19 DIAGNOSIS — B9689 Other specified bacterial agents as the cause of diseases classified elsewhere: Secondary | ICD-10-CM

## 2017-01-19 DIAGNOSIS — D649 Anemia, unspecified: Secondary | ICD-10-CM | POA: Diagnosis not present

## 2017-01-19 DIAGNOSIS — N736 Female pelvic peritoneal adhesions (postinfective): Secondary | ICD-10-CM

## 2017-01-19 DIAGNOSIS — Z23 Encounter for immunization: Secondary | ICD-10-CM

## 2017-01-19 HISTORY — PX: LAPAROTOMY: SHX154

## 2017-01-19 HISTORY — PX: ROBOTIC ASSISTED TOTAL HYSTERECTOMY WITH BILATERAL SALPINGO OOPHERECTOMY: SHX6086

## 2017-01-19 LAB — BASIC METABOLIC PANEL
ANION GAP: 8 (ref 5–15)
BUN: 16 mg/dL (ref 6–20)
CHLORIDE: 102 mmol/L (ref 101–111)
CO2: 28 mmol/L (ref 22–32)
Calcium: 9 mg/dL (ref 8.9–10.3)
Creatinine, Ser: 0.88 mg/dL (ref 0.44–1.00)
GFR calc Af Amer: >60 mL/min (ref >60)
GLUCOSE: 106 mg/dL — AB (ref 65–99)
POTASSIUM: 3.3 mmol/L — AB (ref 3.5–5.1)
Sodium: 138 mmol/L (ref 135–145)

## 2017-01-19 LAB — CBC
HCT: 27.5 % — ABNORMAL LOW (ref 36.0–46.0)
Hemoglobin: 8.6 g/dL — ABNORMAL LOW (ref 12.0–15.0)
MCH: 22.8 pg — AB (ref 26.0–34.0)
MCHC: 31.3 g/dL (ref 30.0–36.0)
MCV: 72.8 fL — ABNORMAL LOW (ref 78.0–100.0)
PLATELETS: 234 10*3/uL (ref 150–400)
RBC: 3.78 MIL/uL — ABNORMAL LOW (ref 3.87–5.11)
RDW: 20.3 % — ABNORMAL HIGH (ref 11.5–15.5)
WBC: 13.7 10*3/uL — ABNORMAL HIGH (ref 4.0–10.5)

## 2017-01-19 LAB — PREPARE RBC (CROSSMATCH)

## 2017-01-19 SURGERY — ROBOTIC ASSISTED TOTAL HYSTERECTOMY WITH BILATERAL SALPINGO OOPHORECTOMY
Anesthesia: General | Site: Abdomen | Laterality: Bilateral

## 2017-01-19 MED ORDER — DEXAMETHASONE SODIUM PHOSPHATE 4 MG/ML IJ SOLN
INTRAMUSCULAR | Status: AC
  Filled 2017-01-19: qty 1

## 2017-01-19 MED ORDER — ONDANSETRON HCL 4 MG/2ML IJ SOLN: 4.0000 mg | Freq: Four times a day (QID) | INTRAMUSCULAR | Status: DC | PRN

## 2017-01-19 MED ORDER — SCOPOLAMINE 1 MG/3DAYS TD PT72
MEDICATED_PATCH | TRANSDERMAL | Status: AC
  Administered 2017-01-19: 1.5 mg via TRANSDERMAL
  Filled 2017-01-19: qty 1

## 2017-01-19 MED ORDER — KETOROLAC TROMETHAMINE 30 MG/ML IJ SOLN: 30.0000 mg | Freq: Four times a day (QID) | INTRAMUSCULAR | Status: DC

## 2017-01-19 MED ORDER — ROCURONIUM BROMIDE 100 MG/10ML IV SOLN
INTRAVENOUS | Status: DC | PRN
  Administered 2017-01-19: 20 mg via INTRAVENOUS
  Administered 2017-01-19: 10 mg via INTRAVENOUS
  Administered 2017-01-19: 50 mg via INTRAVENOUS

## 2017-01-19 MED ORDER — DIPHENHYDRAMINE HCL 50 MG/ML IJ SOLN: 12.5000 mg | Freq: Four times a day (QID) | INTRAMUSCULAR | Status: DC | PRN

## 2017-01-19 MED ORDER — BUPIVACAINE HCL (PF) 0.5 % IJ SOLN
INTRAMUSCULAR | Status: AC
  Filled 2017-01-19: qty 30

## 2017-01-19 MED ORDER — CLONAZEPAM 0.5 MG PO TABS: 0.2500 mg | ORAL_TABLET | Freq: Three times a day (TID) | ORAL | Status: DC | PRN

## 2017-01-19 MED ORDER — SODIUM CHLORIDE 0.9 % IV SOLN
10.0000 mL/h | Freq: Once | INTRAVENOUS | Status: AC
  Administered 2017-01-19 (×2): via INTRAVENOUS

## 2017-01-19 MED ORDER — PROPOFOL 10 MG/ML IV BOLUS
INTRAVENOUS | Status: DC | PRN
  Administered 2017-01-19: 200 mg via INTRAVENOUS

## 2017-01-19 MED ORDER — DESVENLAFAXINE SUCCINATE ER 50 MG PO TB24
50.0000 mg | ORAL_TABLET | Freq: Every day | ORAL | Status: DC
  Filled 2017-01-19: qty 1

## 2017-01-19 MED ORDER — DIPHENHYDRAMINE HCL 12.5 MG/5ML PO ELIX: 12.5000 mg | ORAL_SOLUTION | Freq: Four times a day (QID) | ORAL | Status: DC | PRN

## 2017-01-19 MED ORDER — PHENYLEPHRINE 40 MCG/ML (10ML) SYRINGE FOR IV PUSH (FOR BLOOD PRESSURE SUPPORT)
PREFILLED_SYRINGE | INTRAVENOUS | Status: AC
  Filled 2017-01-19: qty 10

## 2017-01-19 MED ORDER — LIDOCAINE HCL (CARDIAC) 20 MG/ML IV SOLN
INTRAVENOUS | Status: DC | PRN
  Administered 2017-01-19: 50 mg via INTRAVENOUS

## 2017-01-19 MED ORDER — PANTOPRAZOLE SODIUM 40 MG PO TBEC
40.0000 mg | DELAYED_RELEASE_TABLET | Freq: Every day | ORAL | Status: DC
  Administered 2017-01-20: 40 mg via ORAL
  Filled 2017-01-19: qty 1

## 2017-01-19 MED ORDER — HYDROMORPHONE HCL 1 MG/ML IJ SOLN
INTRAMUSCULAR | Status: AC
  Filled 2017-01-19: qty 1

## 2017-01-19 MED ORDER — PRAZOSIN HCL 2 MG PO CAPS
4.0000 mg | ORAL_CAPSULE | Freq: Every day | ORAL | Status: DC
  Administered 2017-01-19: 4 mg via ORAL
  Filled 2017-01-19 (×2): qty 2

## 2017-01-19 MED ORDER — PHENYLEPHRINE HCL 10 MG/ML IJ SOLN
INTRAMUSCULAR | Status: DC | PRN
  Administered 2017-01-19: .04 mg via INTRAVENOUS
  Administered 2017-01-19: .08 mg via INTRAVENOUS
  Administered 2017-01-19: .04 mg via INTRAVENOUS

## 2017-01-19 MED ORDER — NALOXONE HCL 0.4 MG/ML IJ SOLN: 0.4000 mg | INTRAMUSCULAR | Status: DC | PRN

## 2017-01-19 MED ORDER — SUGAMMADEX SODIUM 200 MG/2ML IV SOLN
INTRAVENOUS | Status: AC
  Filled 2017-01-19: qty 2

## 2017-01-19 MED ORDER — KETOROLAC TROMETHAMINE 30 MG/ML IJ SOLN: 30.0000 mg | Freq: Once | INTRAMUSCULAR | Status: DC | PRN

## 2017-01-19 MED ORDER — METFORMIN HCL 500 MG PO TABS
500.0000 mg | ORAL_TABLET | Freq: Every day | ORAL | Status: DC
  Administered 2017-01-20: 500 mg via ORAL
  Filled 2017-01-19 (×2): qty 1

## 2017-01-19 MED ORDER — HYDROMORPHONE HCL 1 MG/ML IJ SOLN: 0.2500 mg | INTRAMUSCULAR | Status: DC | PRN

## 2017-01-19 MED ORDER — ROCURONIUM BROMIDE 100 MG/10ML IV SOLN
INTRAVENOUS | Status: AC
  Filled 2017-01-19: qty 1

## 2017-01-19 MED ORDER — MIDAZOLAM HCL 2 MG/2ML IJ SOLN
INTRAMUSCULAR | Status: AC
  Filled 2017-01-19: qty 2

## 2017-01-19 MED ORDER — HYDROMORPHONE 1 MG/ML IV SOLN
INTRAVENOUS | Status: DC
  Administered 2017-01-19: 0.7 mg via INTRAVENOUS
  Administered 2017-01-19: 21:00:00 via INTRAVENOUS
  Administered 2017-01-20: 1 mg via INTRAVENOUS
  Administered 2017-01-20: 0.8 mg via INTRAVENOUS
  Filled 2017-01-19: qty 25

## 2017-01-19 MED ORDER — PROPOFOL 10 MG/ML IV BOLUS
INTRAVENOUS | Status: AC
  Filled 2017-01-19: qty 20

## 2017-01-19 MED ORDER — DOCUSATE SODIUM 100 MG PO CAPS
100.0000 mg | ORAL_CAPSULE | Freq: Two times a day (BID) | ORAL | Status: DC
  Administered 2017-01-19 – 2017-01-20 (×2): 100 mg via ORAL
  Filled 2017-01-19 (×2): qty 1

## 2017-01-19 MED ORDER — IBUPROFEN 600 MG PO TABS: 600.0000 mg | ORAL_TABLET | Freq: Four times a day (QID) | ORAL | Status: DC | PRN

## 2017-01-19 MED ORDER — FENTANYL CITRATE (PF) 100 MCG/2ML IJ SOLN
INTRAMUSCULAR | Status: DC | PRN
Start: 2017-01-19 — End: 2017-01-19
  Administered 2017-01-19 (×2): 100 ug via INTRAVENOUS
  Administered 2017-01-19: 50 ug via INTRAVENOUS

## 2017-01-19 MED ORDER — EPHEDRINE SULFATE 50 MG/ML IJ SOLN
INTRAMUSCULAR | Status: DC | PRN
Start: 2017-01-19 — End: 2017-01-19
  Administered 2017-01-19 (×2): 10 mg via INTRAVENOUS

## 2017-01-19 MED ORDER — GLYCOPYRROLATE 0.2 MG/ML IJ SOLN
INTRAMUSCULAR | Status: DC | PRN
  Administered 2017-01-19: 0.1 mg via INTRAVENOUS

## 2017-01-19 MED ORDER — POTASSIUM CHLORIDE 2 MEQ/ML IV SOLN
INTRAVENOUS | Status: AC
  Administered 2017-01-19 – 2017-01-20 (×2): via INTRAVENOUS
  Filled 2017-01-19 (×2): qty 1000

## 2017-01-19 MED ORDER — LIDOCAINE HCL (PF) 1 % IJ SOLN
INTRAMUSCULAR | Status: AC
  Filled 2017-01-19: qty 5

## 2017-01-19 MED ORDER — LACTATED RINGERS IR SOLN
Status: DC | PRN
  Administered 2017-01-19: 3000 mL

## 2017-01-19 MED ORDER — FENTANYL CITRATE (PF) 250 MCG/5ML IJ SOLN
INTRAMUSCULAR | Status: AC
  Filled 2017-01-19: qty 5

## 2017-01-19 MED ORDER — SCOPOLAMINE 1 MG/3DAYS TD PT72
1.0000 | MEDICATED_PATCH | Freq: Once | TRANSDERMAL | Status: DC
  Administered 2017-01-19: 1.5 mg via TRANSDERMAL

## 2017-01-19 MED ORDER — FENTANYL CITRATE (PF) 100 MCG/2ML IJ SOLN: 25.0000 ug | INTRAMUSCULAR | Status: DC | PRN

## 2017-01-19 MED ORDER — CEFAZOLIN SODIUM-DEXTROSE 2-4 GM/100ML-% IV SOLN
2.0000 g | INTRAVENOUS | Status: AC
  Administered 2017-01-19: 2 g via INTRAVENOUS

## 2017-01-19 MED ORDER — HYDROMORPHONE HCL 1 MG/ML IJ SOLN
INTRAMUSCULAR | Status: DC | PRN
  Administered 2017-01-19: 1 mg via INTRAVENOUS

## 2017-01-19 MED ORDER — SUGAMMADEX SODIUM 200 MG/2ML IV SOLN
INTRAVENOUS | Status: DC | PRN
  Administered 2017-01-19: 200 mg via INTRAVENOUS

## 2017-01-19 MED ORDER — KCL IN DEXTROSE-NACL 40-5-0.45 MEQ/L-%-% IV SOLN: INTRAVENOUS | Status: DC

## 2017-01-19 MED ORDER — ONDANSETRON HCL 4 MG/2ML IJ SOLN
INTRAMUSCULAR | Status: DC | PRN
Start: 2017-01-19 — End: 2017-01-19
  Administered 2017-01-19: 4 mg via INTRAVENOUS

## 2017-01-19 MED ORDER — CEFAZOLIN SODIUM-DEXTROSE 2-3 GM-%(50ML) IV SOLR
INTRAVENOUS | Status: AC
  Filled 2017-01-19: qty 50

## 2017-01-19 MED ORDER — ALBUTEROL SULFATE HFA 108 (90 BASE) MCG/ACT IN AERS
INHALATION_SPRAY | RESPIRATORY_TRACT | Status: AC
  Filled 2017-01-19: qty 6.7

## 2017-01-19 MED ORDER — LACTATED RINGERS IV SOLN
INTRAVENOUS | Status: DC
  Administered 2017-01-19: 125 mL/h via INTRAVENOUS

## 2017-01-19 MED ORDER — SODIUM CHLORIDE 0.9% FLUSH: 9.0000 mL | INTRAVENOUS | Status: DC | PRN

## 2017-01-19 MED ORDER — KETOROLAC TROMETHAMINE 30 MG/ML IJ SOLN
INTRAMUSCULAR | Status: AC
  Filled 2017-01-19: qty 1

## 2017-01-19 MED ORDER — PROMETHAZINE HCL 25 MG/ML IJ SOLN
6.2500 mg | INTRAMUSCULAR | Status: DC | PRN
Start: 2017-01-19 — End: 2017-01-19

## 2017-01-19 MED ORDER — AMLODIPINE BESYLATE 5 MG PO TABS
5.0000 mg | ORAL_TABLET | Freq: Every day | ORAL | Status: DC
  Administered 2017-01-20: 5 mg via ORAL
  Filled 2017-01-19: qty 1

## 2017-01-19 MED ORDER — GLYCOPYRROLATE 0.2 MG/ML IJ SOLN
INTRAMUSCULAR | Status: AC
  Filled 2017-01-19: qty 1

## 2017-01-19 MED ORDER — ARTIFICIAL TEARS OPHTHALMIC OINT
TOPICAL_OINTMENT | OPHTHALMIC | Status: AC
  Filled 2017-01-19: qty 3.5

## 2017-01-19 MED ORDER — KETOROLAC TROMETHAMINE 30 MG/ML IJ SOLN
30.0000 mg | Freq: Four times a day (QID) | INTRAMUSCULAR | Status: DC
  Administered 2017-01-19 – 2017-01-20 (×3): 30 mg via INTRAVENOUS
  Filled 2017-01-19 (×3): qty 1

## 2017-01-19 MED ORDER — DESVENLAFAXINE SUCCINATE ER 100 MG PO TB24
100.0000 mg | ORAL_TABLET | Freq: Every day | ORAL | Status: DC
  Filled 2017-01-19: qty 1

## 2017-01-19 MED ORDER — EPHEDRINE 5 MG/ML INJ
INTRAVENOUS | Status: AC
  Filled 2017-01-19: qty 10

## 2017-01-19 MED ORDER — SODIUM CHLORIDE 0.9 % IJ SOLN
INTRAMUSCULAR | Status: AC
  Filled 2017-01-19: qty 10

## 2017-01-19 MED ORDER — BISACODYL 10 MG RE SUPP: 10.0000 mg | Freq: Every day | RECTAL | Status: DC | PRN

## 2017-01-19 MED ORDER — ONDANSETRON HCL 4 MG/2ML IJ SOLN
INTRAMUSCULAR | Status: AC
  Filled 2017-01-19: qty 2

## 2017-01-19 MED ORDER — SODIUM CHLORIDE 0.9 % IJ SOLN
INTRAMUSCULAR | Status: DC | PRN
  Administered 2017-01-19: 10 mL

## 2017-01-19 MED ORDER — ONDANSETRON HCL 4 MG/2ML IJ SOLN: 4.0000 mg | Freq: Once | INTRAMUSCULAR | Status: DC | PRN

## 2017-01-19 MED ORDER — LACTATED RINGERS IV SOLN
INTRAVENOUS | Status: DC
  Administered 2017-01-19 (×3): via INTRAVENOUS

## 2017-01-19 MED ORDER — LOSARTAN POTASSIUM 50 MG PO TABS
100.0000 mg | ORAL_TABLET | Freq: Every day | ORAL | Status: DC
  Administered 2017-01-20: 100 mg via ORAL
  Filled 2017-01-19 (×2): qty 2

## 2017-01-19 MED ORDER — HYDROCHLOROTHIAZIDE 25 MG PO TABS
25.0000 mg | ORAL_TABLET | Freq: Every day | ORAL | Status: DC
  Administered 2017-01-20: 25 mg via ORAL
  Filled 2017-01-19: qty 1

## 2017-01-19 MED ORDER — MIDAZOLAM HCL 2 MG/2ML IJ SOLN
INTRAMUSCULAR | Status: DC | PRN
Start: 2017-01-19 — End: 2017-01-19
  Administered 2017-01-19: 2 mg via INTRAVENOUS

## 2017-01-19 MED ORDER — MEPERIDINE HCL 25 MG/ML IJ SOLN: 6.2500 mg | INTRAMUSCULAR | Status: DC | PRN

## 2017-01-19 MED ORDER — ALBUTEROL SULFATE HFA 108 (90 BASE) MCG/ACT IN AERS
INHALATION_SPRAY | RESPIRATORY_TRACT | Status: DC | PRN
  Administered 2017-01-19 (×2): 4 via RESPIRATORY_TRACT

## 2017-01-19 MED ORDER — ZOLPIDEM TARTRATE 5 MG PO TABS: 5.0000 mg | ORAL_TABLET | Freq: Every evening | ORAL | Status: DC | PRN

## 2017-01-19 MED ORDER — SIMETHICONE 80 MG PO CHEW: 80.0000 mg | CHEWABLE_TABLET | Freq: Four times a day (QID) | ORAL | Status: DC | PRN

## 2017-01-19 MED ORDER — ONDANSETRON HCL 4 MG PO TABS: 4.0000 mg | ORAL_TABLET | Freq: Four times a day (QID) | ORAL | Status: DC | PRN

## 2017-01-19 MED ORDER — ARTIFICIAL TEARS OPHTHALMIC OINT
TOPICAL_OINTMENT | OPHTHALMIC | Status: DC | PRN
  Administered 2017-01-19: 1 via OPHTHALMIC

## 2017-01-19 MED ORDER — DEXAMETHASONE SODIUM PHOSPHATE 10 MG/ML IJ SOLN
INTRAMUSCULAR | Status: DC | PRN
  Administered 2017-01-19: 4 mg via INTRAVENOUS

## 2017-01-19 MED ORDER — OXYCODONE-ACETAMINOPHEN 5-325 MG PO TABS
1.0000 | ORAL_TABLET | ORAL | Status: DC | PRN
  Administered 2017-01-20: 1 via ORAL
  Filled 2017-01-19: qty 1

## 2017-01-19 MED ORDER — LOSARTAN POTASSIUM-HCTZ 100-25 MG PO TABS: 1.0000 | ORAL_TABLET | Freq: Every day | ORAL | Status: DC

## 2017-01-19 MED ORDER — BUPIVACAINE HCL 0.5 % IJ SOLN
INTRAMUSCULAR | Status: DC | PRN
  Administered 2017-01-19: 60 mL

## 2017-01-19 MED ORDER — METOPROLOL TARTRATE 50 MG PO TABS
50.0000 mg | ORAL_TABLET | Freq: Two times a day (BID) | ORAL | Status: DC
  Administered 2017-01-20: 50 mg via ORAL
  Filled 2017-01-19 (×4): qty 1

## 2017-01-19 MED ORDER — POLYETHYLENE GLYCOL 3350 17 G PO PACK: 17.0000 g | PACK | Freq: Every day | ORAL | Status: DC | PRN

## 2017-01-19 MED ORDER — ENOXAPARIN SODIUM 40 MG/0.4ML ~~LOC~~ SOLN
40.0000 mg | SUBCUTANEOUS | Status: DC
  Administered 2017-01-20: 40 mg via SUBCUTANEOUS
  Filled 2017-01-19 (×2): qty 0.4

## 2017-01-19 SURGICAL SUPPLY — 73 items
APPLICATOR ARISTA FLEXITIP XL (MISCELLANEOUS) IMPLANT
APPLIER CLIP 5 13 M/L LIGAMAX5 (MISCELLANEOUS)
BARRIER ADHS 3X4 INTERCEED (GAUZE/BANDAGES/DRESSINGS) IMPLANT
BENZOIN TINCTURE PRP APPL 2/3 (GAUZE/BANDAGES/DRESSINGS) ×2 IMPLANT
CATH FOLEY 3WAY  5CC 16FR (CATHETERS) ×1
CATH FOLEY 3WAY 5CC 16FR (CATHETERS) ×1 IMPLANT
CLIP APPLIE 5 13 M/L LIGAMAX5 (MISCELLANEOUS) IMPLANT
CONT PATH 16OZ SNAP LID 3702 (MISCELLANEOUS) ×2 IMPLANT
COVER BACK TABLE 60X90IN (DRAPES) ×4 IMPLANT
COVER TIP SHEARS 8 DVNC (MISCELLANEOUS) ×1 IMPLANT
COVER TIP SHEARS 8MM DA VINCI (MISCELLANEOUS) ×1
DECANTER SPIKE VIAL GLASS SM (MISCELLANEOUS) ×2 IMPLANT
DEFOGGER SCOPE WARMER CLEARIFY (MISCELLANEOUS) ×2 IMPLANT
DERMABOND ADVANCED (GAUZE/BANDAGES/DRESSINGS) ×1
DERMABOND ADVANCED .7 DNX12 (GAUZE/BANDAGES/DRESSINGS) ×1 IMPLANT
DRAPE CAMERA ARM DA VINCI (DRAPE) ×2 IMPLANT
DRAPE SHEET LG 3/4 BI-LAMINATE (DRAPES) ×2 IMPLANT
DRSG COVADERM PLUS 2X2 (GAUZE/BANDAGES/DRESSINGS) ×4 IMPLANT
DRSG OPSITE POSTOP 3X4 (GAUZE/BANDAGES/DRESSINGS) ×2 IMPLANT
DRSG OPSITE POSTOP 4X10 (GAUZE/BANDAGES/DRESSINGS) ×2 IMPLANT
DURAPREP 26ML APPLICATOR (WOUND CARE) ×2 IMPLANT
ELECT REM PT RETURN 9FT ADLT (ELECTROSURGICAL) ×2
ELECTRODE REM PT RTRN 9FT ADLT (ELECTROSURGICAL) ×1 IMPLANT
GLOVE BIO SURGEON STRL SZ7 (GLOVE) ×4 IMPLANT
GLOVE BIOGEL PI IND STRL 7.0 (GLOVE) ×5 IMPLANT
GLOVE BIOGEL PI INDICATOR 7.0 (GLOVE) ×5
GYRUS RUMI II 2.5CM BLUE (DISPOSABLE)
GYRUS RUMI II 3.5CM BLUE (DISPOSABLE)
GYRUS RUMI II 4.0CM BLUE (DISPOSABLE)
HEMOSTAT ARISTA ABSORB 3G PWDR (MISCELLANEOUS) IMPLANT
KIT ACCESSORY DA VINCI DISP (KITS) ×1
KIT ACCESSORY DVNC DISP (KITS) ×1 IMPLANT
LEGGING LITHOTOMY PAIR STRL (DRAPES) ×2 IMPLANT
NEEDLE HYPO 22GX1.5 SAFETY (NEEDLE) ×2 IMPLANT
NEEDLE INSUFFLATION 120MM (ENDOMECHANICALS) ×2 IMPLANT
OCCLUDER COLPOPNEUMO (BALLOONS) ×2 IMPLANT
PACK ROBOT WH (CUSTOM PROCEDURE TRAY) ×2 IMPLANT
PACK ROBOTIC GOWN (GOWN DISPOSABLE) ×2 IMPLANT
PACK TRENDGUARD 450 HYBRID PRO (MISCELLANEOUS) ×1 IMPLANT
PAD PREP 24X48 CUFFED NSTRL (MISCELLANEOUS) ×2 IMPLANT
PROTECTOR NERVE ULNAR (MISCELLANEOUS) ×4 IMPLANT
RUMI II 3.0CM BLUE KOH-EFFICIE (DISPOSABLE) IMPLANT
RUMI II GYRUS 2.5CM BLUE (DISPOSABLE) IMPLANT
RUMI II GYRUS 3.5CM BLUE (DISPOSABLE) IMPLANT
RUMI II GYRUS 4.0CM BLUE (DISPOSABLE) IMPLANT
SEALER ENDOWRIST ONE VESSEL (MISCELLANEOUS) ×6 IMPLANT
SET CYSTO W/LG BORE CLAMP LF (SET/KITS/TRAYS/PACK) ×2 IMPLANT
SET IRRIG TUBING LAPAROSCOPIC (IRRIGATION / IRRIGATOR) ×2 IMPLANT
SET TRI-LUMEN FLTR TB AIRSEAL (TUBING) ×2 IMPLANT
SHEARS HARMONIC ACE PLUS 36CM (ENDOMECHANICALS) ×2 IMPLANT
SOLUTION ANTI FOG 6CC (MISCELLANEOUS) ×2 IMPLANT
STRIP CLOSURE SKIN 1/2X4 (GAUZE/BANDAGES/DRESSINGS) ×2 IMPLANT
SUT VIC AB 0 CT1 27 (SUTURE) ×3
SUT VIC AB 0 CT1 27XBRD ANBCTR (SUTURE) ×3 IMPLANT
SUT VIC AB 3-0 CT1 27 (SUTURE) ×1
SUT VIC AB 3-0 CT1 TAPERPNT 27 (SUTURE) ×1 IMPLANT
SUT VICRYL 0 UR6 27IN ABS (SUTURE) ×4 IMPLANT
SUT VICRYL 4-0 PS2 18IN ABS (SUTURE) ×8 IMPLANT
SUT VLOC 180 0 9IN  GS21 (SUTURE) ×1
SUT VLOC 180 0 9IN GS21 (SUTURE) ×1 IMPLANT
SYSTEM CARTER THOMASON II (TROCAR) IMPLANT
TIP RUMI ORANGE 6.7MMX12CM (TIP) ×2 IMPLANT
TIP UTERINE 5.1X6CM LAV DISP (MISCELLANEOUS) IMPLANT
TIP UTERINE 6.7X10CM GRN DISP (MISCELLANEOUS) IMPLANT
TIP UTERINE 6.7X6CM WHT DISP (MISCELLANEOUS) IMPLANT
TIP UTERINE 6.7X8CM BLUE DISP (MISCELLANEOUS) IMPLANT
TOWEL OR 17X24 6PK STRL BLUE (TOWEL DISPOSABLE) ×4 IMPLANT
TRENDGUARD 450 HYBRID PRO PACK (MISCELLANEOUS) ×2
TROCAR DILATING TIP 12MM 150MM (ENDOMECHANICALS) ×2 IMPLANT
TROCAR DISP BLADELESS 8 DVNC (TROCAR) ×1 IMPLANT
TROCAR DISP BLADELESS 8MM (TROCAR) ×1
TROCAR PORT AIRSEAL 5X120 (TROCAR) ×2 IMPLANT
WATER STERILE IRR 1000ML POUR (IV SOLUTION) ×2 IMPLANT

## 2017-01-19 NOTE — Brief Op Note (Addendum)
01/19/2017  5:03 PM  PATIENT:  Sandra Bean  56 y.o. female  PRE-OPERATIVE DIAGNOSIS:  Uterine Fibroids  POST-OPERATIVE DIAGNOSIS:  uterine fibroids   PROCEDURE:  Procedure(s): ROBOTIC ASSISTED TOTAL LAPAROSCOPIC SUPRACERVICAL HYSTERECTOMY WITH BILATERAL SALPINGO OOPHORECTOMY WITH MINI LAPAROTOMY TO REMOVE SPECIMEN (Bilateral) MINI-LAPAROTOMY TO REMOVE UTERUS, BILATERAL FALLOPIAN TUBES AND BILATERAL OVARIES (Bilateral);  lysis of bowel adhesions  SURGEON:  Surgeon(s) and Role:    * Lavonia Drafts, MD - Primary    * Dove, Wilhemina Cash, MD - Assisting  ANESTHESIA:   general  EBL:  1150 mL   BLOOD ADMINISTERED:1 unit PRBCs  DRAINS: none   LOCAL MEDICATIONS USED:  MARCAINE     SPECIMEN:  Source of Specimen:  uterus, bilateral salpingectomy, bilateral ovaries    DISPOSITION OF SPECIMEN:  PATHOLOGY  COUNTS:  YES  TOURNIQUET:  * No tourniquets in log *  DICTATION: .Note written in EPIC  PLAN OF CARE: Admit to inpatient   PATIENT DISPOSITION:  PACU - hemodynamically stable.   Delay start of Pharmacological VTE agent (>24hrs) due to surgical blood loss or risk of bleeding: yes  Complications: none Immediate  Bettyann Birchler L. Harraway-Smith, M.D., [name]

## 2017-01-19 NOTE — Op Note (Signed)
01/19/2017  5:03 PM  PATIENT:  Sandra Bean  56 y.o. female  PRE-OPERATIVE DIAGNOSIS:  Uterine Fibroids  POST-OPERATIVE DIAGNOSIS:  uterine fibroids   PROCEDURE:  Procedure(s): ROBOTIC ASSISTED TOTAL LAPAROSCOPIC SUPRACERVICAL HYSTERECTOMY WITH BILATERAL SALPINGO OOPHORECTOMY WITH MINI LAPAROTOMY TO REMOVE SPECIMEN (Bilateral) MINI-LAPAROTOMY TO REMOVE UTERUS, BILATERAL FALLOPIAN TUBES AND BILATERAL OVARIES (Bilateral); lysis of bowel adhesions  SURGEON:  Surgeon(s) and Role:    * Lavonia Drafts, MD - Primary    * Dove, Wilhemina Cash, MD - Assisting  ANESTHESIA:   general  EBL:  1150 mL   BLOOD ADMINISTERED:1 unit PRBCs  DRAINS: none   LOCAL MEDICATIONS USED:  MARCAINE     SPECIMEN:  Source of Specimen:  uterus, bilateral salpingectomy, bilateral ovaries    DISPOSITION OF SPECIMEN:  PATHOLOGY  COUNTS:  YES  TOURNIQUET:  * No tourniquets in log *  DICTATION: .Note written in EPIC  PLAN OF CARE: Admit to inpatient   PATIENT DISPOSITION:  PACU - hemodynamically stable.   Delay start of Pharmacological VTE agent (>24hrs) due to surgical blood loss or risk of bleeding: yes  Complications: none Immediate  The risks, benefits, and alternatives of surgery were explained, understood, and accepted. Consents were signed. All questions were answered. The patient requested leaving her cervix in initially. I explained to her that if I removed the uterus from the vagina that I would not be able to leave it in. I reviewed with tp the risks and benefits to leaving the cervix in place.   Findings:  There was a large pedunculated fibroid attached to the fundus of the uterus.  There were filmy adhesions of bowel across the entire uterus and the sidewall. There were multiple pedunculated fibroids attached to the uterus. And the uterus was complete irregular in contour. The appendix was adherent to the uterus anteriorly.  The fallopian tubes and ovaries were normal in appearance.    The patient was taken to the operating room and general anesthesia was applied without complication. She was placed in the dorsal lithotomy position and her abdomen and vagina were prepped and draped after she had been carefully positioned on the table.  A bimanual exam revealed a globular uterus with what appeared to be a large pedunculated fibroid.  The uterus appeared to be 14 weeks sized including all of the fibroids. The uterus was mobile. Her adnexa could not be palpated. The cervix was measured and the uterus was sounded to 12 cm. A Rumi uterine manipulator was placed without difficulty. A Foley catheter was placed and it drained clear throughout the case. Gloves were changed and attention was turned to the abdomen. A  5 mm optiview was placed in the left upper quadrant without difficulty. The pelvis was evaluated with the above findings.  CO2 was used to insufflate the abdomen to approximately 4 L. After good pneumoperitoneum was established, a 12 mm trocar was placed 5 cm above the umbilicus.  Laparoscopy confirmed correct placement. She was placed in Trendelenburg position and ports were placed in appropriate positions on her abdomen to allow maximum exposure during the robotic case. Specifically two 8 mm ports were placed 8cm lateral to the midline port.  These were all placed under direct laparoscopic visualization. The robot was docked and I proceeded with a robotic portion of the case.  The pelvis was inspected as above. The Rumi was found to have perforated one of the fibroids. The surrounding tissue appeared to be without injury. The bowel adhesions were carefully dissected  away from the uterus and sidewalls without energy using the Prograsp and an atraumatic grasper.   The ureters and the infundibulopelvic ligaments were identified. I excised the fallopian tubes bilaterally. The round ligaments were not readily visualized on either side. I identified the uteroovarian ligaments bilaterally and  using the Vessel sealer cauterized and cut this tissue.  The uterine vessels were identified and cauterized and then cut.The bladder was pushed out of the operative site but the entire bladder could not be clearly removed from the cervix. . The uterus was detached from the cervix. The vessels could not be reached or readily identified beside the cervix. All pedicles were hemostatic. Due to difficulty with further visualization of the anatomy the decision was made to perform a mini laparotomy to remove all of the attached organs. The pelvis was irrigated. After determining excellent hemostasis, the robot was undocked.   At this point the robot was undocked and attention was directed to the abdomin. A Pfannenstiel skin incision was made with scalpel and carried through to the underlying layer of fascia. The fascia was incised in the midline, and this incision was extended bilaterally using the Mayo scissors.  Kocher clamps were applied to the superior aspect of the fascial incision and the underlying rectus muscles were dissected off bluntly. A similar process was carried out on the inferior aspect of the fascial incision. The rectus muscles were separated in the midline bluntly and the peritoneum was entered bluntly.  The previously detached uterus and the detached fibroid along with the ovaries and the fallopian tubes were removed via this incision. The pelvis was irrigated was cleared of all clot and debris. Hemostasis was confirmed on all surfaces.  The fascia was then closed using 0 Vicryl.  The subcutaneous layer was irrigated, then reapproximated with 3-0 vicryl in a running locked fashion, and the skin was closed with a 4-0 Vicryl subcuticular stitch.  The midline port site fascia was closed with ) vicryl and the skin of all of the ports was closed with 3-0 vicryl.   40cc of 0.5% marcaine was injected into the incisions and benzoin and steristrips were applied.  The patient tolerated the procedure well.  Sponge, lap, instrument and needle counts were correct x 2.  She was taken to the recovery room in stable condition.   Sponge, lap and needle counts were correct x 2.  Sharifah Champine L. Harraway-Smith, M.D., [name]

## 2017-01-19 NOTE — Transfer of Care (Signed)
Immediate Anesthesia Transfer of Care Note  Patient: Sandra Bean  Procedure(s) Performed: ROBOTIC ASSISTED TOTAL LAPAROSCOPIC SUPRACERVICAL HYSTERECTOMY WITH BILATERAL SALPINGO OOPHORECTOMY WITH MINI LAPAROTOMY TO REMOVE SPECIMEN (Bilateral Abdomen) MINI-LAPAROTOMY TO REMOVE UTERUS, BILATERAL FALLOPIAN TUBES AND BILATERAL OVARIES (Bilateral Abdomen)  Patient Location: PACU  Anesthesia Type:General  Level of Consciousness: awake, alert  and oriented  Airway & Oxygen Therapy: Patient Spontanous Breathing and Patient connected to face mask oxygen  Post-op Assessment: Report given to RN and Post -op Vital signs reviewed and stable  Post vital signs: Reviewed and stable  Last Vitals:  Vitals:   01/19/17 1223  BP: 117/81  Pulse: 66  Resp: 16  Temp: 36.9 C  SpO2: 99%    Last Pain:  Vitals:   01/19/17 1223  TempSrc: Oral  PainSc: 4       Patients Stated Pain Goal: 4 (98/11/91 4782)  Complications: No apparent anesthesia complications

## 2017-01-19 NOTE — H&P (Signed)
Preoperative History and Physical  Sandra Bean is a 56 y.o. G3P0030 here for surgical management of symptomatic uterine fibroids. Pt denies change in her sx since her initial visit.    Proposed surgery: Robot assisted total laparoscopic hysterectomy with bilateral salpingectomy with bilateral oophoerctomy  Past Medical History:  Diagnosis Date  . Anemia   . Anxiety    PTDS  . Arthritis    RIGHT SIDE  . COPD (chronic obstructive pulmonary disease) ([place])   . Depression   . Diabetes mellitus without complication (HCC)    Type 2  . Fibroids   . Fibromyalgia    DAILY PAIN  . GERD (gastroesophageal reflux disease)   . H/O hernia repair 2009  . Headache   . Hypertension   . Hyperthyroidism    NON PER PATIENT  . Mental disorder   . Mitral valve prolapse   . Ovarian cyst    Past Surgical History:  Procedure Laterality Date  . FOOT SURGERY    . HERNIA REPAIR    . NOSE SURGERY    . THERAPEUTIC ABORTION     OB History    Gravida Para Term Preterm AB Living   3       3     SAB TAB Ectopic Multiple Live Births     3           Patient denies any cervical dysplasia or STIs. Prescriptions Prior to Admission  Medication Sig Dispense Refill Last Dose  . amLODipine (NORVASC) 5 MG tablet Take 5 mg by mouth once daily  0 Taking  . aspirin EC 325 MG tablet Take 975 mg by mouth daily as needed for mild pain (headaches).     . clonazePAM (KLONOPIN) 0.5 MG tablet Take 0.25-0.5 mg by mouth daily as needed for anxiety.     Marland Kitchen desvenlafaxine (PRISTIQ) 50 MG 24 hr tablet Take 50 mg by mouth daily. For 1 to 2 weeks then move up to 100 mg once daily  0   . diclofenac (VOLTAREN) 75 MG EC tablet Take 75 mg by mouth once daily  0 Taking  . HYDROmorphone (DILAUDID) 2 MG tablet Take 1 tablet (2 mg total) by mouth every 12 (twelve) hours as needed for severe pain. 20 tablet 0 Taking  . losartan-hydrochlorothiazide (HYZAAR) 100-25 MG tablet Take 1 tablet by mouth once daily  0 Taking  . metFORMIN  (GLUCOPHAGE) 500 MG tablet Take 500 mg by mouth once daily  0 Taking  . metoprolol tartrate (LOPRESSOR) 50 MG tablet Take 50 mg by mouth once daily  0 Taking  . Multiple Vitamin (MULTIVITAMIN IRON-FREE) TABS Take 1 tablet by mouth daily.     . Multiple Vitamins-Minerals (HAIR SKIN AND NAILS FORMULA) TABS Take 2 tablets by mouth daily.     Marland Kitchen omeprazole (PRILOSEC) 20 MG capsule Take 20 mg by mouth 2 (two) times daily.     . prazosin (MINIPRESS) 2 MG capsule Take 4 mg by mouth at bedtime.     . sodium chloride (OCEAN) 0.65 % SOLN nasal spray Place 1 spray into both nostrils daily.     . traZODone (DESYREL) 100 MG tablet Take 200-300 mg by mouth at bedtime.     Marland Kitchen desvenlafaxine (PRISTIQ) 100 MG 24 hr tablet Take 100 mg by mouth daily.     . metroNIDAZOLE (FLAGYL) 500 MG tablet Take 1 tablet (500 mg total) by mouth 2 (two) times daily. (Patient not taking: Reported on 01/01/2017) 14 tablet 0 Completed Course  at Unknown time  . promethazine (PHENERGAN) 12.5 MG tablet Take 1 tablet (12.5 mg total) by mouth every 6 (six) hours as needed for nausea or vomiting. (Patient not taking: Reported on 01/01/2017) 30 tablet 0 Completed Course at Unknown time    Allergies  Allergen Reactions  . Famotidine Nausea Only  . Mirtazapine Other (See Comments)    Overly drowsy, couldn't function    Social History:   reports that she has been smoking Cigarettes.  She has been smoking about 0.50 packs per day. She has never used smokeless tobacco. She reports that she does not drink alcohol or use drugs. Pt smokes a little less than 1ppd.  History reviewed. No pertinent family history.  Review of Systems: Noncontributory  PHYSICAL EXAM: There were no vitals taken for this visit. General appearance - alert, well appearing, and in no distress Chest - clear to auscultation, no wheezes, rales or rhonchi, symmetric air entry Heart - normal rate and regular rhythm Abdomen - soft, nontender, nondistended, no masses or  organomegaly; there is a well healed vertical incision around her umbilicus.  (pt believe that there is mesh in her umbilicus).   Pelvic - examination not indicated Extremities - peripheral pulses normal, no pedal edema, no clubbing or cyanosis  Labs: Results for orders placed or performed during the hospital encounter of 01/06/17 (from the past 336 hour(s))  CBC   Collection Time: 01/06/17 12:50 PM  Result Value Ref Range   WBC 4.7 4.0 - 10.5 K/uL   RBC 4.55 3.87 - 5.11 MIL/uL   Hemoglobin 9.7 (L) 12.0 - 15.0 g/dL   HCT 31.8 (L) 36.0 - 46.0 %   MCV 69.9 (L) 78.0 - 100.0 fL   MCH 21.3 (L) 26.0 - 34.0 pg   MCHC 30.5 30.0 - 36.0 g/dL   RDW 18.4 (H) 11.5 - 15.5 %   Platelets 159 150 - 400 K/uL  Prepare RBC (crossmatch)   Collection Time: 01/06/17 12:50 PM  Result Value Ref Range   Order Confirmation ORDER PROCESSED BY BLOOD BANK   Type and screen [place]   Collection Time: 01/06/17 12:50 PM  Result Value Ref Range   ABO/RH(D) O NEG    Antibody Screen NEG    Sample Expiration 01/20/2017    Extend sample reason NO TRANSFUSIONS OR PREGNANCY IN THE PAST 3 MONTHS    Unit Number [id number]    Blood Component Type RBC LR PHER2    Unit division 00    Status of Unit REL FROM [facility]    Transfusion Status OK TO TRANSFUSE    Crossmatch Result Compatible    Unit Number [id number]    Blood Component Type RED CELLS,LR    Unit division 00    Status of Unit REL FROM [facility]    Transfusion Status OK TO TRANSFUSE    Crossmatch Result Compatible    Unit Number [id number]    Blood Component Type RBC LR PHER1    Unit division 00    Status of Unit ALLOCATED    Transfusion Status OK TO TRANSFUSE    Crossmatch Result Compatible   ABO/Rh   Collection Time: 01/06/17 12:50 PM  Result Value Ref Range   ABO/RH(D) O NEG   BPAM RBC   Collection Time: 01/06/17 12:50 PM  Result Value Ref Range   Blood Product Unit Number [id number]    Unit Type and Rh 9500    Blood  Product Expiration Date 673419379024    ISSUE DATE /  TIME 174944967591    Blood Product Unit Number [id number]    PRODUCT CODE [id number]    Unit Type and Rh 9500    Blood Product Expiration Date 009233007622    Blood Product Unit Number [id number]    Unit Type and Rh 9500    Blood Product Expiration Date 937342876811     Imaging Studies: 10/26/2016 CLINICAL DATA:  Pelvic pain.  Uterine fibroids.  EXAM: TRANSABDOMINAL AND TRANSVAGINAL ULTRASOUND OF PELVIS  TECHNIQUE: Both transabdominal and transvaginal ultrasound examinations of the pelvis were performed. Transabdominal technique was performed for global imaging of the pelvis including uterus, ovaries, adnexal regions, and pelvic cul-de-sac. It was necessary to proceed with endovaginal exam following the transabdominal exam to visualize the ovaries.  COMPARISON:  None.  FINDINGS: Uterus  Measurements: 11.6 x 7.9 x 6.6 cm. Two large uterine fibroids are noted, with the largest measuring 8.5 x 7.3 x 6.6 cm arising from the fundus. The other fibroid measures 6.1 x 5.3 x 4.9 cm in the lower uterine segment.  Endometrium  Could not be visualized due to large uterine fibroids.  Right ovary  Not visualized.  Left ovary  Not visualized.  Other findings  Small amount of free fluid is noted which most likely is physiologic.  IMPRESSION: Large uterine fibroids are noted, with the largest measuring 8.5 cm arising from the fundus. Endometrium and ovaries are not visualized.  Assessment: Patient Active Problem List   Diagnosis Date Noted  . Fibroid uterus 10/26/2016  . Abdominal pain in female 10/26/2016  . Bacterial vaginitis 10/26/2016  Tx'd BV  Plan: Patient will undergo surgical management with Robot assisted total laparoscopic hysterectomy with bilateral salpingectomy and bilateral oophorectomy. The risks of surgery were discussed in detail with the patient including but not limited to:  bleeding which may require transfusion or reoperation; infection which may require antibiotics; injury to surrounding organs which may involve bowel, bladder, ureters ; need for additional procedures including laparoscopy or laparotomy; thromboembolic phenomenon, surgical site problems and other postoperative/anesthesia complications. Likelihood of success in alleviating the patient's condition was discussed. Routine postoperative instructions will be reviewed with the patient and her family in detail after surgery.  The patient concurred with the proposed plan, giving informed written consent for the surgery.  Patient has been NPO since last night she will remain NPO for procedure.  Anesthesia and OR aware.  Preoperative prophylactic antibiotics and SCDs ordered on call to the OR.  To OR when ready.  Dionna Wiedemann L. Harraway-Smith, M.D., [facility] 01/19/2017 12:03 PM

## 2017-01-19 NOTE — Anesthesia Preprocedure Evaluation (Addendum)
Anesthesia Evaluation  Patient identified by MRN, date of birth, ID band Patient awake    Reviewed: Allergy & Precautions, NPO status , Patient's Chart, lab work & pertinent test results, reviewed documented beta blocker date and time   Airway Mallampati: I  TM Distance: >3 FB Neck ROM: Full    Dental  (+) Dental Advisory Given, Teeth Intact   Pulmonary COPD, Current Smoker,    Pulmonary exam normal breath sounds clear to auscultation       Cardiovascular hypertension, Pt. on medications and Pt. on home beta blockers Normal cardiovascular exam Rhythm:Regular Rate:Normal  Mitral valve prolapse   Neuro/Psych  Headaches, Anxiety Depression    GI/Hepatic Neg liver ROS, GERD  Medicated,  Endo/Other  diabetes, Type 2, Oral Hypoglycemic AgentsObesity  Renal/GU negative Renal ROS  negative genitourinary   Musculoskeletal  (+) Arthritis , Fibromyalgia -, narcotic dependent  Abdominal (+) + obese,   Peds  Hematology  (+) anemia ,   Anesthesia Other Findings   Reproductive/Obstetrics                           Anesthesia Physical Anesthesia Plan  ASA: III  Anesthesia Plan: General   Post-op Pain Management:    Induction: Intravenous  PONV Risk Score and Plan: 4 or greater and Ondansetron, Dexamethasone, Midazolam, Scopolamine patch - Pre-op and Treatment may vary due to age or medical condition  Airway Management Planned: Oral ETT  Additional Equipment: None  Intra-op Plan:   Post-operative Plan: Extubation in OR  Informed Consent: I have reviewed the patients History and Physical, chart, labs and discussed the procedure including the risks, benefits and alternatives for the proposed anesthesia with the patient or authorized representative who has indicated his/her understanding and acceptance.   Dental advisory given  Plan Discussed with: CRNA and Surgeon  Anesthesia Plan Comments:         Anesthesia Quick Evaluation

## 2017-01-19 NOTE — Anesthesia Procedure Notes (Signed)
Procedure Name: Intubation Date/Time: 01/19/2017 1:13 PM Performed by: Bufford Spikes Pre-anesthesia Checklist: Patient identified, Emergency Drugs available, Suction available and Patient being monitored Patient Re-evaluated:Patient Re-evaluated prior to induction Oxygen Delivery Method: Circle system utilized Preoxygenation: Pre-oxygenation with 100% oxygen Induction Type: IV induction Ventilation: Mask ventilation without difficulty Laryngoscope Size: Miller and 2 Grade View: Grade II Tube type: Oral Tube size: 7.0 mm Number of attempts: 1 Airway Equipment and Method: Stylet Placement Confirmation: ETT inserted through vocal cords under direct vision,  positive ETCO2 and breath sounds checked- equal and bilateral Secured at: 21 cm Tube secured with: Tape Dental Injury: Teeth and Oropharynx as per pre-operative assessment

## 2017-01-20 ENCOUNTER — Encounter (HOSPITAL_COMMUNITY): Payer: Self-pay | Admitting: Obstetrics & Gynecology

## 2017-01-20 DIAGNOSIS — D259 Leiomyoma of uterus, unspecified: Secondary | ICD-10-CM | POA: Diagnosis present

## 2017-01-20 DIAGNOSIS — Z23 Encounter for immunization: Secondary | ICD-10-CM | POA: Diagnosis not present

## 2017-01-20 LAB — CBC
HCT: 22.3 % — ABNORMAL LOW (ref 36.0–46.0)
HEMATOCRIT: 23.3 % — AB (ref 36.0–46.0)
Hemoglobin: 7.2 g/dL — ABNORMAL LOW (ref 12.0–15.0)
Hemoglobin: 7.4 g/dL — ABNORMAL LOW (ref 12.0–15.0)
MCH: 23.5 pg — AB (ref 26.0–34.0)
MCH: 23.3 pg — AB (ref 26.0–34.0)
MCHC: 32.3 g/dL (ref 30.0–36.0)
MCHC: 31.8 g/dL (ref 30.0–36.0)
MCV: 72.6 fL — AB (ref 78.0–100.0)
MCV: 73.5 fL — ABNORMAL LOW (ref 78.0–100.0)
PLATELETS: 193 10*3/uL (ref 150–400)
Platelets: 190 10*3/uL (ref 150–400)
RBC: 3.07 MIL/uL — AB (ref 3.87–5.11)
RBC: 3.17 MIL/uL — ABNORMAL LOW (ref 3.87–5.11)
RDW: 20.2 % — AB (ref 11.5–15.5)
RDW: 20.2 % — AB (ref 11.5–15.5)
WBC: 8.9 10*3/uL (ref 4.0–10.5)
WBC: 9 10*3/uL (ref 4.0–10.5)

## 2017-01-20 LAB — BASIC METABOLIC PANEL
Anion gap: 5 (ref 5–15)
BUN: 15 mg/dL (ref 6–20)
CHLORIDE: 102 mmol/L (ref 101–111)
CO2: 27 mmol/L (ref 22–32)
CREATININE: 0.82 mg/dL (ref 0.44–1.00)
Calcium: 7.7 mg/dL — ABNORMAL LOW (ref 8.9–10.3)
GFR calc Af Amer: >60 mL/min (ref >60)
GFR calc non Af Amer: >60 mL/min (ref >60)
GLUCOSE: 185 mg/dL — AB (ref 65–99)
Potassium: 3.8 mmol/L (ref 3.5–5.1)
Sodium: 134 mmol/L — ABNORMAL LOW (ref 135–145)

## 2017-01-20 LAB — GLUCOSE, CAPILLARY: Glucose-Capillary: 151 mg/dL — ABNORMAL HIGH (ref 65–99)

## 2017-01-20 MED ORDER — OXYCODONE-ACETAMINOPHEN 5-325 MG PO TABS: 1.0000 | ORAL_TABLET | ORAL | 0 refills | Status: DC | PRN

## 2017-01-20 MED ORDER — INFLUENZA VAC SPLIT QUAD 0.5 ML IM SUSY
0.5000 mL | PREFILLED_SYRINGE | INTRAMUSCULAR | Status: AC
  Administered 2017-01-20: 0.5 mL via INTRAMUSCULAR

## 2017-01-20 MED ORDER — DOCUSATE SODIUM 100 MG PO CAPS: 100.0000 mg | ORAL_CAPSULE | Freq: Two times a day (BID) | ORAL | 0 refills | Status: DC

## 2017-01-20 MED ORDER — METRONIDAZOLE 500 MG PO TABS: 500.0000 mg | ORAL_TABLET | Freq: Two times a day (BID) | ORAL | 0 refills | Status: DC

## 2017-01-20 MED ORDER — METFORMIN HCL 500 MG PO TABS: 1000.0000 mg | ORAL_TABLET | Freq: Every day | ORAL | 3 refills | Status: DC

## 2017-01-20 MED ORDER — VENLAFAXINE HCL ER 150 MG PO CP24
150.0000 mg | ORAL_CAPSULE | Freq: Every day | ORAL | Status: DC
  Administered 2017-01-20: 150 mg via ORAL
  Filled 2017-01-20 (×2): qty 1

## 2017-01-20 MED ORDER — IBUPROFEN 600 MG PO TABS: 600.0000 mg | ORAL_TABLET | Freq: Four times a day (QID) | ORAL | 0 refills | Status: DC | PRN

## 2017-01-20 NOTE — Discharge Instructions (Signed)

## 2017-01-20 NOTE — Discharge Summary (Signed)
Physician Discharge Summary  Patient ID: Sandra Bean MRN: [id number] DOB/AGE: 57/18/1962 57 y.o.  Admit date: 01/19/2017 Discharge date: 01/20/2017  Admission Diagnoses: see below  Discharge Diagnoses:  Principal Problem:   Fibroid uterus Active Problems:   Abdominal pain in female   Pelvic peritoneal adhesions, female   Anemia due to chronic illness   Benign essential hypertension   Tobacco abuse   Post-operative state   Discharged Condition: good  Hospital Course: Patient had an uncomplicated surgery; for further details of this surgery, please refer to the operative note. Furthermore, the patient had an uncomplicated postoperative course. At the time of discharge, her pain was controlled on oral pain medications; she was ambulating, voiding without difficulty, tolerating regular diet and passing flatus.  Pt requested discharge today.  She was deemed stable for discharge to home.  Pt reports min bleeding from vagina.  Consults: None  Significant Diagnostic Studies: labs: CBC, BMP  Treatments: surgery: RATH with BSO with minilap to remove specimen  and Transfusion of 1 unit of PRBCs  Discharge Exam: Blood pressure (!) 103/57, pulse 75, temperature 98 F (36.7 C), temperature source Oral, resp. rate 20, SpO2 100 %. General appearance: alert, cooperative and no distress Resp: clear to auscultation bilaterally Cardio: regular rate and rhythm, S1, S2 normal, no murmur, click, rub or gallop GI: soft, non-tender; bowel sounds normal; no masses,  no organomegaly Extremities: extremities normal, atraumatic, no cyanosis or edema Incision/Wound:all dressings clean and dry.   CBC Latest Ref Rng & Units 01/20/2017 01/20/2017 01/19/2017  WBC 4.0 - 10.5 K/uL 9.0 8.9 13.7(H)  Hemoglobin 12.0 - 15.0 g/dL 7.4(L) 7.2(L) 8.6(L)  Hematocrit 36.0 - 46.0 % 23.3(L) 22.3(L) 27.5(L)  Platelets 150 - 400 K/uL 190 193 234    Disposition: 01-Home or Self Care  Discharge Instructions    Call  MD for:  difficulty breathing, headache or visual disturbances    Complete by:  As directed    Call MD for:  extreme fatigue    Complete by:  As directed    Call MD for:  hives    Complete by:  As directed    Call MD for:  persistant dizziness or light-headedness    Complete by:  As directed    Call MD for:  persistant nausea and vomiting    Complete by:  As directed    Call MD for:  redness, tenderness, or signs of infection (pain, swelling, redness, odor or green/yellow discharge around incision site)    Complete by:  As directed    Call MD for:  severe uncontrolled pain    Complete by:  As directed    Call MD for:  temperature >100.4    Complete by:  As directed    Diet - low sodium heart healthy    Complete by:  As directed    Discharge wound care:    Complete by:  As directed    Remove Honeycomb dressing in 1 week.   Driving Restrictions    Complete by:  As directed    No driving for 2 weeks   Increase activity slowly    Complete by:  As directed    Lifting restrictions    Complete by:  As directed    No heavy lifting, bending or stooping.   Sexual Activity Restrictions    Complete by:  As directed    No sexual intercourse for 6 weeks     Allergies as of 01/20/2017      Reactions  Famotidine Nausea Only   Mirtazapine Other (See Comments)   Overly drowsy, couldn't function       Medication List    STOP taking these medications   diclofenac 75 MG EC tablet Commonly known as:  VOLTAREN   HYDROmorphone 2 MG tablet Commonly known as:  DILAUDID   promethazine 12.5 MG tablet Commonly known as:  PHENERGAN     TAKE these medications   amLODipine 5 MG tablet Commonly known as:  NORVASC Take 5 mg by mouth once daily   aspirin EC 325 MG tablet Take 975 mg by mouth daily as needed for mild pain (headaches).   clonazePAM 0.5 MG tablet Commonly known as:  KLONOPIN Take 0.25-0.5 mg by mouth daily as needed for anxiety.   desvenlafaxine 50 MG 24 hr  tablet Commonly known as:  PRISTIQ Take 50 mg by mouth daily. For 1 to 2 weeks then move up to 100 mg once daily What changed:  Another medication with the same name was removed. Continue taking this medication, and follow the directions you see here.   docusate sodium 100 MG capsule Commonly known as:  COLACE Take 1 capsule (100 mg total) by mouth 2 (two) times daily.   HAIR SKIN AND NAILS FORMULA Tabs Take 2 tablets by mouth daily.   ibuprofen 600 MG tablet Commonly known as:  ADVIL,MOTRIN Take 1 tablet (600 mg total) by mouth every 6 (six) hours as needed (mild pain).   losartan-hydrochlorothiazide 100-25 MG tablet Commonly known as:  HYZAAR Take 1 tablet by mouth once daily   metFORMIN 500 MG tablet Commonly known as:  GLUCOPHAGE Take 2 tablets (1,000 mg total) by mouth daily with breakfast. What changed:  See the new instructions.   metoprolol tartrate 50 MG tablet Commonly known as:  LOPRESSOR Take 50 mg by mouth once daily   metroNIDAZOLE 500 MG tablet Commonly known as:  FLAGYL Take 1 tablet (500 mg total) by mouth 2 (two) times daily.   MULTIVITAMIN IRON-FREE Tabs Take 1 tablet by mouth daily.   omeprazole 20 MG capsule Commonly known as:  PRILOSEC Take 20 mg by mouth 2 (two) times daily.   oxyCODONE-acetaminophen 5-325 MG tablet Commonly known as:  PERCOCET/ROXICET Take 1-2 tablets by mouth every 4 (four) hours as needed (moderate to severe pain (when tolerating fluids)).   prazosin 2 MG capsule Commonly known as:  MINIPRESS Take 4 mg by mouth at bedtime.   sodium chloride 0.65 % Soln nasal spray Commonly known as:  OCEAN Place 1 spray into both nostrils daily.   traZODone 100 MG tablet Commonly known as:  DESYREL Take 200-300 mg by mouth at bedtime.            Discharge Care Instructions        Start     Ordered   01/20/17 0000  Discharge wound care:    Comments:  Remove Honeycomb dressing in 1 week.   01/20/17 1205     Follow-up  Information    Lavonia Drafts, MD Follow up in 2 week(s).   Specialty:  Obstetrics and Gynecology Contact information: [place] [address] [phone]           Signed: Lavonia Drafts 01/20/2017, 12:06 PM

## 2017-01-20 NOTE — Progress Notes (Signed)
Education and discharge instructions reviewed with patient. Prescriptions given and questions answered. Taken out on a wheelchair.

## 2017-01-21 LAB — BPAM RBC
Blood Product Expiration Date: 201810292359
Blood Product Expiration Date: 201811052359
Blood Product Expiration Date: 201811062359
Blood Product Expiration Date: 201811172359
Blood Product Expiration Date: 201811192359
ISSUE DATE / TIME: 201810191502
ISSUE DATE / TIME: 201810301518
ISSUE DATE / TIME: 201810301518
UNIT TYPE AND RH: 9500
UNIT TYPE AND RH: 9500
Unit Type and Rh: 9500
Unit Type and Rh: 9500
Unit Type and Rh: 9500

## 2017-01-21 LAB — TYPE AND SCREEN
ABO/RH(D): O NEG
ANTIBODY SCREEN: NEGATIVE
UNIT DIVISION: 0
UNIT DIVISION: 0
UNIT DIVISION: 0
Unit division: 0
Unit division: 0

## 2017-01-21 NOTE — Anesthesia Postprocedure Evaluation (Signed)
Anesthesia Post Note  Patient: Sandra Bean  Procedure(s) Performed: ROBOTIC ASSISTED TOTAL LAPAROSCOPIC SUPRACERVICAL HYSTERECTOMY WITH BILATERAL SALPINGO OOPHORECTOMY WITH MINI LAPAROTOMY TO REMOVE SPECIMEN (Bilateral Abdomen) MINI-LAPAROTOMY TO REMOVE UTERUS, BILATERAL FALLOPIAN TUBES AND BILATERAL OVARIES (Bilateral Abdomen)     Patient location during evaluation: PACU Anesthesia Type: General Level of consciousness: awake and sedated Pain management: pain level controlled Vital Signs Assessment: post-procedure vital signs reviewed and stable Respiratory status: spontaneous breathing Cardiovascular status: stable Postop Assessment: no apparent nausea or vomiting Anesthetic complications: no    Last Vitals:  Vitals:   01/20/17 0813 01/20/17 1218  BP: (!) 103/57 (!) 115/59  Pulse: 75 85  Resp: 20 18  Temp: 36.7 C 36.8 C  SpO2: 100% 100%    Last Pain:  Vitals:   01/20/17 1218  TempSrc: Oral  PainSc:    Pain Goal: Patients Stated Pain Goal: 4 (01/19/17 1902)               Laury Huizar JR,JOHN Mateo Flow

## 2017-01-25 ENCOUNTER — Telehealth: Payer: Self-pay | Admitting: Obstetrics & Gynecology

## 2017-01-25 NOTE — Telephone Encounter (Signed)
Patient called into front office stating she had multiple questions. Patient asked if her FMLA papers went through yet or not. Checked with front office and printer- nothing there however. Informed patient and she states she will check with her insurance but just may come by tomorrow. Patient states she talked to her insurance about getting home health but needs an order from [place]. Asked patient what she needs home health for and if Dr Ihor Dow ordered it. Patient states she thought she was going to have help around the house with showering and cooking meals but doesn't really have anyone. Discussed with patient that she can do those things on her and encouraged movement. Discussed with patient we want her up and moving but not to over do or do strenuous activity like lifting laundry, vacuuming, mopping etc. Patient verbalized understanding & states she wasn't aware she could do those things. Patient asked if she could walk around outside and go down stairs. Told patient she certainly could we just don't want her to do it excessively. Patient verbalized understanding. Reviewed instructions for removing honeycomb dressing & wound care. Patient verbalized understanding and states she is still having some pain and received an Rx for percocet but it is making her itch & is requesting Rx for dilaudid instead. Told patient I will check with Dr Ihor Dow tomorrow as she will be here in the hospital. Recommended she take ibuprofen around the clock every 6-8 hours over the next several days especially while I am waiting to hear from Dr Ihor Dow. Patient verbalized understanding to all & will await my return phone call.

## 2017-01-25 NOTE — Telephone Encounter (Signed)
Patient is requesting a call from a nurse she just had surgery..  Also need more pain medication

## 2017-01-26 ENCOUNTER — Telehealth: Payer: Self-pay | Admitting: Obstetrics & Gynecology

## 2017-01-26 NOTE — Telephone Encounter (Signed)
TC to pt. She wanted a review of all of her activities. Pt had gas pains 2 days prev. She had some diarrhea but, she is passing gas and having BMs. She is voiding without difficulty. She does c/o the itching Pt denies nausea. Pt reports that she is not smoking more than usual. She has been ambulating more today.  She has ambulated more today than prev days.   All questions answered.   Pt to f/u in 1 week.  Dennisse Swader L. Harraway-Smith, M.D., [name]

## 2017-01-26 NOTE — Telephone Encounter (Signed)
TC to pt. No answer LMTCB.  Elinora Weigand L. Harraway-Smith, M.D., [name]

## 2017-02-03 ENCOUNTER — Ambulatory Visit (INDEPENDENT_AMBULATORY_CARE_PROVIDER_SITE_OTHER): Payer: Medicare PPO | Admitting: Obstetrics & Gynecology

## 2017-02-03 ENCOUNTER — Encounter: Payer: Self-pay | Admitting: Obstetrics & Gynecology

## 2017-02-03 VITALS — BP 133/93 | HR 95 | Wt 189.3 lb

## 2017-02-03 DIAGNOSIS — Z90711 Acquired absence of uterus with remaining cervical stump: Secondary | ICD-10-CM | POA: Insufficient documentation

## 2017-02-03 DIAGNOSIS — Z9889 Other specified postprocedural states: Secondary | ICD-10-CM

## 2017-02-03 MED ORDER — METFORMIN HCL 500 MG PO TABS: 1000.0000 mg | ORAL_TABLET | Freq: Two times a day (BID) | ORAL | 3 refills | Status: DC

## 2017-02-03 NOTE — Progress Notes (Signed)
History:  56 y.o. G3P0030 here today for 2 week post op check Pt denies bleeding. She isi voiding without difficulty  She reports multiple loose stools. She in on an increased dosage of Metformin. They are normal formed stools. She reports that her stomach is grumbling.     The following portions of the patient's history were reviewed and updated as appropriate: allergies, current medications, past family history, past medical history, past social history, past surgical history and problem list.  Review of Systems:  Pertinent items are noted in HPI.   Objective:  Physical Exam Blood pressure (!) 133/93, pulse 95, weight 189 lb 4.8 oz (85.9 kg).  CONSTITUTIONAL: Well-developed, well-nourished female in no acute distress.  HENT:  Normocephalic, atraumatic EYES: Conjunctivae and EOM are normal. No scleral icterus.  NECK: Normal range of motion SKIN: Skin is warm and dry. No rash noted. Not diaphoretic.No pallor. [place]: Alert and oriented to person, place, and time. Normal coordination.  Abd: Soft, nontender and nondistended; port sites and incision healing well.  Pelvic: Not done  Labs and Imaging 01/19/2017 Diagnosis Uterus, ovaries and fallopian tubes ENDOMETRIUM: INACTIVE ENDOMETRIUM MYOMETRIUM: LEIOMYOMAS RIGHT OVARY: FIBROMA BILATERAL FALLOPIAN TUBE AND LEFT OVARY: HISTOLOGICAL UNREMARKABLE  Assessment & Plan:   Change Metformin from 1000mg  q day to bid 500mg  bid  F/u in 4 weeks or sooner prn Gradually increase activities.  Reviewed surg path  Hoyle Sauer L. Harraway-Smith, M.D., [name]

## 2017-02-03 NOTE — Patient Instructions (Signed)

## 2017-02-09 ENCOUNTER — Telehealth: Payer: Self-pay | Admitting: General Practice

## 2017-02-09 DIAGNOSIS — E119 Type 2 diabetes mellitus without complications: Secondary | ICD-10-CM

## 2017-02-09 DIAGNOSIS — D649 Anemia, unspecified: Secondary | ICD-10-CM

## 2017-02-09 MED ORDER — INTEGRA F 125-1 MG PO CAPS: 1.0000 | ORAL_CAPSULE | Freq: Every day | ORAL | 3 refills | Status: DC

## 2017-02-09 MED ORDER — METFORMIN HCL 500 MG PO TABS: 500.0000 mg | ORAL_TABLET | Freq: Two times a day (BID) | ORAL | 3 refills | Status: DC

## 2017-02-09 NOTE — Telephone Encounter (Signed)
Patient called and left message on nurse line stating she saw Dr Ihor Dow last Wednesday and was given two prescriptions. Patient states the iron isn't at her pharmacy and the metformin was sent to the wrong pharmacy. Patient states she uses [facility] on [address]. Called Dr Ihor Dow & obtained orders. Called patient, no answer- left message stating we are trying to reach you to return your phone call. We have received your message and sent your prescriptions to the correct pharmacy. You may call us back if you have questions

## 2017-02-18 ENCOUNTER — Telehealth: Payer: Self-pay | Admitting: General Practice

## 2017-02-18 NOTE — Telephone Encounter (Signed)
Patient called and left message stating she is a patient of Dr Ihor Dow and is currently on a LOA due to recent surgery. Patient states her current return to work date is 12/11 but her post op visit isn't until 12/20. Patient needs assistance getting her papers corrected. Spoke to Dr Ihor Dow who agrees patient can work to work on 12/11 and plan for post op visit on 12/20. Called and informed patient. Patient verbalized understanding and asked if that was full duty. Told patient it is. Patient verbalized understanding & had no questions

## 2017-03-11 ENCOUNTER — Ambulatory Visit: Payer: Self-pay | Admitting: Obstetrics & Gynecology

## 2017-04-06 ENCOUNTER — Ambulatory Visit: Payer: Medicare PPO | Admitting: Obstetrics & Gynecology

## 2017-04-06 ENCOUNTER — Encounter: Payer: Self-pay | Admitting: Obstetrics & Gynecology

## 2017-04-06 VITALS — BP 115/80 | HR 94 | Wt 188.7 lb

## 2017-04-06 DIAGNOSIS — N938 Other specified abnormal uterine and vaginal bleeding: Secondary | ICD-10-CM | POA: Diagnosis not present

## 2017-04-06 DIAGNOSIS — D500 Iron deficiency anemia secondary to blood loss (chronic): Secondary | ICD-10-CM | POA: Diagnosis not present

## 2017-04-06 NOTE — Patient Instructions (Signed)
Steps to Quit Smoking Smoking tobacco can be harmful to your health and can affect almost every organ in your body. Smoking puts you, and those around you, at risk for developing many serious chronic diseases. Quitting smoking is difficult, but it is one of the best things that you can do for your health. It is never too late to quit. What are the benefits of quitting smoking? When you quit smoking, you lower your risk of developing serious diseases and conditions, such as:  Lung cancer or lung disease, such as COPD.  Heart disease.  Stroke.  Heart attack.  Infertility.  Osteoporosis and bone fractures.  Additionally, symptoms such as coughing, wheezing, and shortness of breath may get better when you quit. You may also find that you get sick less often because your body is stronger at fighting off colds and infections. If you are pregnant, quitting smoking can help to reduce your chances of having a baby of low birth weight. How do I get ready to quit? When you decide to quit smoking, create a plan to make sure that you are successful. Before you quit:  Pick a date to quit. Set a date within the next two weeks to give you time to prepare.  Write down the reasons why you are quitting. Keep this list in places where you will see it often, such as on your bathroom mirror or in your car or wallet.  Identify the people, places, things, and activities that make you want to smoke (triggers) and avoid them. Make sure to take these actions: ? Throw away all cigarettes at home, at work, and in your car. ? Throw away smoking accessories, such as ashtrays and lighters. ? Clean your car and make sure to empty the ashtray. ? Clean your home, including curtains and carpets.  Tell your family, friends, and coworkers that you are quitting. Support from your loved ones can make quitting easier.  Talk with your health care provider about your options for quitting smoking.  Find out what treatment  options are covered by your health insurance.  What strategies can I use to quit smoking? Talk with your healthcare provider about different strategies to quit smoking. Some strategies include:  Quitting smoking altogether instead of gradually lessening how much you smoke over a period of time. Research shows that quitting "cold turkey" is more successful than gradually quitting.  Attending in-person counseling to help you build problem-solving skills. You are more likely to have success in quitting if you attend several counseling sessions. Even short sessions of 10 minutes can be effective.  Finding resources and support systems that can help you to quit smoking and remain smoke-free after you quit. These resources are most helpful when you use them often. They can include: ? Online chats with a counselor. ? Telephone quitlines. ? Printed self-help materials. ? Support groups or group counseling. ? Text messaging programs. ? Mobile phone applications.  Taking medicines to help you quit smoking. (If you are pregnant or breastfeeding, talk with your health care provider first.) Some medicines contain nicotine and some do not. Both types of medicines help with cravings, but the medicines that include nicotine help to relieve withdrawal symptoms. Your health care provider may recommend: ? Nicotine patches, gum, or lozenges. ? Nicotine inhalers or sprays. ? Non-nicotine medicine that is taken by mouth.  Talk with your health care provider about combining strategies, such as taking medicines while you are also receiving in-person counseling. Using these two strategies together   makes you more likely to succeed in quitting than if you used either strategy on its own. If you are pregnant or breastfeeding, talk with your health care provider about finding counseling or other support strategies to quit smoking. Do not take medicine to help you quit smoking unless told to do so by your health care  provider. What things can I do to make it easier to quit? Quitting smoking might feel overwhelming at first, but there is a lot that you can do to make it easier. Take these important actions:  Reach out to your family and friends and ask that they support and encourage you during this time. Call telephone quitlines, reach out to support groups, or work with a counselor for support.  Ask people who smoke to avoid smoking around you.  Avoid places that trigger you to smoke, such as bars, parties, or smoke-break areas at work.  Spend time around people who do not smoke.  Lessen stress in your life, because stress can be a smoking trigger for some people. To lessen stress, try: ? Exercising regularly. ? Deep-breathing exercises. ? Yoga. ? Meditating. ? Performing a body scan. This involves closing your eyes, scanning your body from head to toe, and noticing which parts of your body are particularly tense. Purposefully relax the muscles in those areas.  Download or purchase mobile phone or tablet apps (applications) that can help you stick to your quit plan by providing reminders, tips, and encouragement. There are many free apps, such as QuitGuide from the CDC (Centers for Disease Control and Prevention). You can find other support for quitting smoking (smoking cessation) through smokefree.gov and other websites.  How will I feel when I quit smoking? Within the first 24 hours of quitting smoking, you may start to feel some withdrawal symptoms. These symptoms are usually most noticeable 2-3 days after quitting, but they usually do not last beyond 2-3 weeks. Changes or symptoms that you might experience include:  Mood swings.  Restlessness, anxiety, or irritation.  Difficulty concentrating.  Dizziness.  Strong cravings for sugary foods in addition to nicotine.  Mild weight gain.  Constipation.  Nausea.  Coughing or a sore throat.  Changes in how your medicines work in your  body.  A depressed mood.  Difficulty sleeping (insomnia).  After the first 2-3 weeks of quitting, you may start to notice more positive results, such as:  Improved sense of smell and taste.  Decreased coughing and sore throat.  Slower heart rate.  Lower blood pressure.  Clearer skin.  The ability to breathe more easily.  Fewer sick days.  Quitting smoking is very challenging for most people. Do not get discouraged if you are not successful the first time. Some people need to make many attempts to quit before they achieve long-term success. Do your best to stick to your quit plan, and talk with your health care provider if you have any questions or concerns. This information is not intended to replace advice given to you by your health care provider. Make sure you discuss any questions you have with your health care provider. Document Released: 03/03/2001 Document Revised: 11/05/2015 Document Reviewed: 07/24/2014 Elsevier Interactive Patient Education  2018 [employer]  

## 2017-04-06 NOTE — Progress Notes (Signed)
Pt stated the site of the incision still get sore but not painful.Marland Kitchen

## 2017-04-06 NOTE — Progress Notes (Signed)
History:  57 y.o. G3P0030 here today for 3 month post op check. S/p RATH converted to a supracervical hyst with BSO 01/19/2017. Pt wants to discuss weight management and tob abuse. She is interested in weight loss and wants to stop using tobacco. She was prev on Wellbutrin but, did not like the way it made her feel.     Her niece is no longer living with her. The niece is now having issues with not working or going to school.   The following portions of the patient's history were reviewed and updated as appropriate: allergies, current medications, past family history, past medical history, past social history, past surgical history and problem list.  Review of Systems:  Pertinent items are noted in HPI.   Objective:  Physical Exam Blood pressure 115/80, pulse 94, weight 188 lb 11.2 oz (85.6 kg).  CONSTITUTIONAL: Well-developed, well-nourished female in no acute distress.  HENT:  Normocephalic, atraumatic EYES: Conjunctivae and EOM are normal. No scleral icterus.  NECK: Normal range of motion SKIN: Skin is warm and dry. No rash noted. Not diaphoretic.No pallor. [place]: Alert and oriented to person, place, and time. Normal coordination.  Abd: Soft, nontender and nondistended Pelvic: not done   Assessment & Plan:  3 months post op check from a [place] converted to a supracervical hyst. Pt denies vasomotor sx.   Pt to discuss medical management  to tob cessation with her primary care provider. Reviewed some non pharmacologic recommendations  Rec [employer] for assist with weight loss Reviewed diet and exercise recommendations. Reviewed  F/u in 1 year or sooner prn  Anemias of chronic blood loss.  On iorn.   frec cont until labs return   Total face-to-face time with patient was 30 min.  Greater than 50% was spent in counseling and coordination of care with the patient.   Sandra Bean, M.D., Sandra Bean

## 2017-04-07 LAB — CBC
HEMATOCRIT: 32.5 % — AB (ref 34.0–46.6)
HEMOGLOBIN: 9.7 g/dL — AB (ref 11.1–15.9)
MCH: 21.6 pg — AB (ref 26.6–33.0)
MCHC: 29.8 g/dL — ABNORMAL LOW (ref 31.5–35.7)
MCV: 72 fL — AB (ref 79–97)
PLATELETS: 237 10*3/uL (ref 150–379)
RBC: 4.5 x10E6/uL (ref 3.77–5.28)
RDW: 22.8 % — ABNORMAL HIGH (ref 12.3–15.4)
WBC: 4.7 10*3/uL (ref 3.4–10.8)

## 2017-04-14 ENCOUNTER — Telehealth: Payer: Self-pay | Admitting: *Deleted

## 2017-04-14 NOTE — Telephone Encounter (Addendum)
Called pt per request of Dr. Ihor Dow. I informed her that she is still anemic however it is improving. She should continue taking Integra for another 2 months then take a daily multivitamin for women. Pt stated that she is currently taking a multivitamin for women which does not have iron. Once she is finished taking Integra, she wants to know if she should continue the same multivitamin or find one which has iron in it. I advised that I will ask her doctor and call back once a response has been received. Pt voiced understanding of all information and instructions given.   1/25  1530  Called pt and left message on her VM that I have had a response from Dr. Ihor Dow regarding her medication question. Once she has finished all of the Integra including the refills prescribed, she may take any multivitamin. Pt may call back if she has additional questions.

## 2018-01-26 ENCOUNTER — Encounter (HOSPITAL_COMMUNITY): Payer: Self-pay

## 2018-01-26 ENCOUNTER — Emergency Department (HOSPITAL_COMMUNITY)
Admission: EM | Admit: 2018-01-26 | Discharge: 2018-01-26 | Disposition: A | Discharge status: Home / Self Care | Payer: Medicare PPO | Attending: Emergency Medicine | Admitting: Emergency Medicine

## 2018-01-26 ENCOUNTER — Emergency Department (HOSPITAL_COMMUNITY): Payer: Medicare PPO

## 2018-01-26 DIAGNOSIS — Z79899 Other long term (current) drug therapy: Secondary | ICD-10-CM | POA: Diagnosis not present

## 2018-01-26 DIAGNOSIS — E119 Type 2 diabetes mellitus without complications: Secondary | ICD-10-CM | POA: Insufficient documentation

## 2018-01-26 DIAGNOSIS — J449 Chronic obstructive pulmonary disease, unspecified: Secondary | ICD-10-CM | POA: Diagnosis not present

## 2018-01-26 DIAGNOSIS — R748 Abnormal levels of other serum enzymes: Secondary | ICD-10-CM | POA: Insufficient documentation

## 2018-01-26 DIAGNOSIS — E860 Dehydration: Secondary | ICD-10-CM | POA: Diagnosis not present

## 2018-01-26 DIAGNOSIS — Z7984 Long term (current) use of oral hypoglycemic drugs: Secondary | ICD-10-CM | POA: Diagnosis not present

## 2018-01-26 DIAGNOSIS — F17210 Nicotine dependence, cigarettes, uncomplicated: Secondary | ICD-10-CM | POA: Diagnosis not present

## 2018-01-26 DIAGNOSIS — Z7982 Long term (current) use of aspirin: Secondary | ICD-10-CM | POA: Diagnosis not present

## 2018-01-26 DIAGNOSIS — R799 Abnormal finding of blood chemistry, unspecified: Secondary | ICD-10-CM | POA: Insufficient documentation

## 2018-01-26 DIAGNOSIS — R1110 Vomiting, unspecified: Secondary | ICD-10-CM | POA: Insufficient documentation

## 2018-01-26 DIAGNOSIS — R739 Hyperglycemia, unspecified: Secondary | ICD-10-CM | POA: Diagnosis present

## 2018-01-26 DIAGNOSIS — I10 Essential (primary) hypertension: Secondary | ICD-10-CM | POA: Diagnosis not present

## 2018-01-26 LAB — URINALYSIS, ROUTINE W REFLEX MICROSCOPIC
BILIRUBIN URINE: NEGATIVE
HGB URINE DIPSTICK: NEGATIVE
Ketones, ur: NEGATIVE mg/dL
Leukocytes, UA: NEGATIVE
Nitrite: NEGATIVE
PH: 5 (ref 5.0–8.0)
Protein, ur: NEGATIVE mg/dL
SPECIFIC GRAVITY, URINE: 1.016 (ref 1.005–1.030)

## 2018-01-26 LAB — CBG MONITORING, ED: GLUCOSE-CAPILLARY: 152 mg/dL — AB (ref 70–99)

## 2018-01-26 LAB — CBC
HCT: 37.8 % (ref 36.0–46.0)
HEMOGLOBIN: 11.6 g/dL — AB (ref 12.0–15.0)
MCH: 23.2 pg — ABNORMAL LOW (ref 26.0–34.0)
MCHC: 30.7 g/dL (ref 30.0–36.0)
MCV: 75.8 fL — ABNORMAL LOW (ref 80.0–100.0)
PLATELETS: 262 10*3/uL (ref 150–400)
RBC: 4.99 MIL/uL (ref 3.87–5.11)
RDW: 16.3 % — AB (ref 11.5–15.5)
WBC: 9.1 10*3/uL (ref 4.0–10.5)
nRBC: 0 % (ref 0.0–0.2)

## 2018-01-26 LAB — CK
CK TOTAL: 1101 U/L — AB (ref 38–234)
Total CK: 989 U/L — ABNORMAL HIGH (ref 38–234)

## 2018-01-26 LAB — BASIC METABOLIC PANEL
ANION GAP: 14 (ref 5–15)
BUN: 19 mg/dL (ref 6–20)
CALCIUM: 9.3 mg/dL (ref 8.9–10.3)
CHLORIDE: 92 mmol/L — AB (ref 98–111)
CO2: 27 mmol/L (ref 22–32)
CREATININE: 0.95 mg/dL (ref 0.44–1.00)
GFR calc Af Amer: >60 mL/min (ref >60)
GFR calc non Af Amer: >60 mL/min (ref >60)
Glucose, Bld: 169 mg/dL — ABNORMAL HIGH (ref 70–99)
POTASSIUM: 3.2 mmol/L — AB (ref 3.5–5.1)
SODIUM: 133 mmol/L — AB (ref 135–145)

## 2018-01-26 LAB — I-STAT TROPONIN, ED
TROPONIN I, POC: 0.01 ng/mL (ref 0.00–0.08)
Troponin i, poc: 0.02 ng/mL (ref 0.00–0.08)

## 2018-01-26 MED ORDER — SODIUM CHLORIDE 0.9 % IV BOLUS
1000.0000 mL | Freq: Once | INTRAVENOUS | Status: AC
  Administered 2018-01-26: 1000 mL via INTRAVENOUS

## 2018-01-26 MED ORDER — KETOROLAC TROMETHAMINE 30 MG/ML IJ SOLN
15.0000 mg | Freq: Once | INTRAMUSCULAR | Status: AC
  Administered 2018-01-26: 15 mg via INTRAMUSCULAR
  Filled 2018-01-26: qty 1

## 2018-01-26 MED ORDER — POTASSIUM CHLORIDE CRYS ER 20 MEQ PO TBCR
40.0000 meq | EXTENDED_RELEASE_TABLET | Freq: Once | ORAL | Status: AC
  Administered 2018-01-26: 40 meq via ORAL
  Filled 2018-01-26: qty 2

## 2018-01-26 NOTE — ED Provider Notes (Signed)
[facility] DEPT Provider Note   CSN: [id number] Arrival date & time: 01/26/18  1630     History   Chief Complaint Chief Complaint  Patient presents with  . Hyperglycemia  . Abnormal Lab    HPI Sandra Bean is a 57 y.o. female.  HPI   Patient is a 57 year old female with a history of anemia, COPD, T2DM, GERD, headache, hypertension, hyper thyroidism, mitral valve prolapse, who presents emergency department today for evaluation of multiple complaints.  Patient states that she has been having high blood sugars this week and has been following up with her PCP.  She was just started on an increased dose of metformin and Januvia.  States that since starting this medication she has had a few episodes of vomiting.  States that she has had cramping to her muscles for the last week and is felt very fatigued.  Has been getting more tired with walking but denies overt shortness of breath.  Denies chest pain.  No swelling in the legs.  No significant abdominal pain.  No known fevers or chills. States she has not been compliant with her diet for diabetes.   Reviewed care everywhere records.  Patient seen by her PCP earlier today.  She had previously been seen on 01/24/2018 with a glucose of 549.  Her metformin dose is increased and Januvia was added at that time however patient developed vomiting, dizziness nausea muscle pains and 4 pound weight loss.  She was sent to the ED for evaluation of possible DKA.  Past Medical History:  Diagnosis Date  . Anemia   . Anxiety    PTDS  . Arthritis    RIGHT SIDE  . COPD (chronic obstructive pulmonary disease) ([place])   . Depression   . Diabetes mellitus without complication (HCC)    Type 2  . Fibroids   . Fibromyalgia    DAILY PAIN  . GERD (gastroesophageal reflux disease)   . H/O hernia repair 2009  . Headache   . Hypertension   . Hyperthyroidism    NON PER PATIENT  . Mental disorder   . Mitral valve prolapse   .  Ovarian cyst     Patient Active Problem List   Diagnosis Date Noted  . Pelvic peritoneal adhesions, female 01/19/2017  . Anemia due to chronic illness 01/19/2017  . Benign essential hypertension 01/19/2017  . Tobacco abuse 01/19/2017  . Post-operative state 01/19/2017  . Fibroid uterus 10/26/2016  . Abdominal pain in female 10/26/2016  . Bacterial vaginitis 10/26/2016    Past Surgical History:  Procedure Laterality Date  . FOOT SURGERY    . HERNIA REPAIR    . LAPAROTOMY Bilateral 01/19/2017   Procedure: MINI-LAPAROTOMY TO REMOVE UTERUS, BILATERAL FALLOPIAN TUBES AND BILATERAL OVARIES;  Surgeon: Lavonia Drafts, MD;  Location: [facility];  Service: Gynecology;  Laterality: Bilateral;  . NOSE SURGERY    . ROBOTIC ASSISTED TOTAL HYSTERECTOMY WITH BILATERAL SALPINGO OOPHERECTOMY Bilateral 01/19/2017   Procedure: ROBOTIC ASSISTED TOTAL LAPAROSCOPIC SUPRACERVICAL HYSTERECTOMY WITH BILATERAL SALPINGO OOPHORECTOMY WITH MINI LAPAROTOMY TO REMOVE SPECIMEN;  Surgeon: Lavonia Drafts, MD;  Location: [facility];  Service: Gynecology;  Laterality: Bilateral;  . THERAPEUTIC ABORTION       OB History    Gravida  3   Para      Term      Preterm      AB  3   Living        SAB      TAB  3   Ectopic      Multiple      Live Births               Home Medications    Prior to Admission medications   Medication Sig Start Date End Date Taking? Authorizing Provider  amLODipine (NORVASC) 5 MG tablet Take 5 mg by mouth daily.  10/01/16  Yes [provider]  atorvastatin (LIPITOR) 20 MG tablet Take 20 mg by mouth daily.  02/03/17 02/03/18 Yes [provider]  clonazePAM (KLONOPIN) 0.5 MG tablet Take 0.25-0.5 mg by mouth daily as needed for anxiety.   Yes [provider]  desvenlafaxine (PRISTIQ) 100 MG 24 hr tablet Take 100 mg by mouth daily.  01/23/18  Yes [provider]  JARDIANCE 10 MG TABS tablet Take 10 mg by mouth daily.  01/24/18   Yes [provider]  losartan-hydrochlorothiazide (HYZAAR) 100-25 MG tablet Take 1 tablet by mouth once daily   Yes [provider]  metFORMIN (GLUCOPHAGE) 500 MG tablet Take 1 tablet (500 mg total) by mouth 2 (two) times daily with a meal. Patient taking differently: Take 1,000 mg by mouth 2 (two) times daily with a meal.  02/09/17  Yes Harraway-Smith, Hoyle Sauer, MD  metoprolol tartrate (LOPRESSOR) 50 MG tablet Take 50 mg by mouth daily.  10/01/16  Yes [provider]  Multiple Vitamin (MULTIVITAMIN IRON-FREE) TABS Take 1 tablet by mouth daily.   Yes [provider]  Multiple Vitamins-Minerals (HAIR SKIN AND NAILS FORMULA) TABS Take 1 tablet by mouth daily.    Yes [provider]  omeprazole (PRILOSEC) 20 MG capsule Take 20 mg by mouth daily.    Yes [provider]  pramipexole (MIRAPEX) 1.5 MG tablet Take 1.5 mg by mouth at bedtime.  12/12/17  Yes [provider]  prazosin (MINIPRESS) 5 MG capsule Take 5 mg by mouth at bedtime.  12/12/17  Yes [provider]  sodium chloride (OCEAN) 0.65 % SOLN nasal spray Place 1 spray into both nostrils daily as needed for congestion.    Yes [provider]  traZODone (DESYREL) 100 MG tablet Take 200-300 mg by mouth at bedtime as needed for sleep.    Yes [provider]  aspirin EC 325 MG tablet Take 975 mg by mouth daily as needed for mild pain (headaches).    [provider]  Fe Fum-FePoly-FA-Vit C-Vit B3 (INTEGRA F) 125-1 MG CAPS Take 1 capsule by mouth daily. Patient not taking: Reported on 01/26/2018 02/09/17   Lavonia Drafts, MD  ibuprofen (ADVIL,MOTRIN) 600 MG tablet Take 1 tablet (600 mg total) by mouth every 6 (six) hours as needed (mild pain). Patient not taking: Reported on 04/06/2017 01/20/17   Lavonia Drafts, MD    Family History History reviewed. No pertinent family history.  Social History Social History   Tobacco Use  . Smoking  status: Current Every Day Smoker    Packs/day: 0.50    Types: Cigarettes  . Smokeless tobacco: Never Used  Substance Use Topics  . Alcohol use: No  . Drug use: No     Allergies   Famotidine and Mirtazapine   Review of Systems Review of Systems  Constitutional: Positive for fatigue. Negative for fever.  HENT: Negative for congestion.   Eyes: Negative for visual disturbance.  Respiratory: Negative for cough and shortness of breath.   Cardiovascular: Negative for chest pain and leg swelling.  Gastrointestinal: Positive for nausea and vomiting. Negative for abdominal pain, blood in stool,  constipation and diarrhea.  Genitourinary: Negative for dysuria, frequency and urgency.  Musculoskeletal: Negative for back pain and neck pain.       Muscle cramping  Skin: Negative for rash.  Neurological: Negative for dizziness, weakness, light-headedness, numbness and headaches.   Physical Exam Updated Vital Signs BP 123/75 (BP Location: Right Arm)   Pulse 79   Temp 98.4 F (36.9 C) (Oral)   Resp 18   SpO2 99%   Physical Exam  Constitutional: She appears well-developed and well-nourished. No distress.  HENT:  Head: Normocephalic and atraumatic.  Mucous membranes are dry  Eyes: Conjunctivae are normal.  Neck: Neck supple.  Cardiovascular: Normal rate, regular rhythm and normal heart sounds.  No murmur heard. Pulmonary/Chest: Effort normal and breath sounds normal. No respiratory distress.  Abdominal: Soft. Bowel sounds are normal. She exhibits no distension. There is no tenderness. There is no guarding.  Musculoskeletal:  TTP to the bilat calves. No swelling, edema, or erythema.  Neurological: She is alert.  Skin: Skin is warm and dry.  Psychiatric: She has a normal mood and affect.  Nursing note and vitals reviewed.   ED Treatments / Results  Labs (all labs ordered are listed, but only abnormal results are displayed) Labs Reviewed  BASIC METABOLIC PANEL - Abnormal; Notable  for the following components:      Result Value   Sodium 133 (*)    Potassium 3.2 (*)    Chloride 92 (*)    Glucose, Bld 169 (*)    All other components within normal limits  CBC - Abnormal; Notable for the following components:   Hemoglobin 11.6 (*)    MCV 75.8 (*)    MCH 23.2 (*)    RDW 16.3 (*)    All other components within normal limits  URINALYSIS, ROUTINE W REFLEX MICROSCOPIC - Abnormal; Notable for the following components:   Glucose, UA >=500 (*)    Bacteria, UA RARE (*)    All other components within normal limits  CK - Abnormal; Notable for the following components:   Total CK 1,101 (*)    All other components within normal limits  CK - Abnormal; Notable for the following components:   Total CK 989 (*)    All other components within normal limits  CBG MONITORING, ED - Abnormal; Notable for the following components:   Glucose-Capillary 152 (*)    All other components within normal limits  I-STAT TROPONIN, ED  I-STAT TROPONIN, ED    EKG EKG Interpretation  Date/Time:  Wednesday January 26 2018 18:06:12 EST Ventricular Rate:  80 PR Interval:  202 QRS Duration: 98 QT Interval:  416 QTC Calculation: 479 R Axis:   -33 Text Interpretation:  Normal sinus rhythm Biatrial enlargement Left axis deviation Possible Anterior infarct , age undetermined Abnormal ECG When compared to prior, no significant changes.  No STEMI Confirmed by Antony Blackbird [phone]) on 01/26/2018 9:30:04 PM   Radiology Dg Chest 2 View  Result Date: 01/26/2018 CLINICAL DATA:  Left-sided chest pain for 2 days.  History of COPD. EXAM: CHEST - 2 VIEW COMPARISON:  None. FINDINGS: The cardiomediastinal silhouette is within normal limits. There is anterior eventration of the right hemidiaphragm. The lungs are well inflated and clear. No pleural effusion or pneumothorax is identified. No acute osseous abnormality is seen. IMPRESSION: No active cardiopulmonary disease. Electronically Signed   By: Logan Bores  M.D.   On: 01/26/2018 18:55    Procedures Procedures (including critical care time)  Medications Ordered in  ED Medications  sodium chloride 0.9 % bolus 1,000 mL (0 mLs Intravenous Stopped 01/26/18 1846)  ketorolac (TORADOL) 30 MG/ML injection 15 mg (15 mg Intramuscular Given 01/26/18 1838)  potassium chloride SA (K-DUR,KLOR-CON) CR tablet 40 mEq (40 mEq Oral Given 01/26/18 1838)  sodium chloride 0.9 % bolus 1,000 mL (0 mLs Intravenous Stopped 01/26/18 2037)     Initial Impression / Assessment and Plan / ED Course  I have reviewed the triage vital signs and the nursing notes.  Pertinent labs & imaging results that were available during my care of the patient were reviewed by me and considered in my medical decision making (see chart for details).    Final Clinical Impressions(s) / ED Diagnoses   Final diagnoses:  Dehydration  Elevated CK   Patient presented the ED today complaining of fatigue, muscle cramps, and high blood sugars.  Was evaluated by her PCP and was told to come to the ED due to concern of possible DKA.  Vital signs stable, afebrile.  Physical exam is very reassuring.  CBC is without leukocytosis or anemia.  Hemoglobin actually much improved from 9 months ago.  BMP with some electrolyte derangement including hyponatremia and hypokalemia.  CO2 is normal and there is no elevated anion gap.  Troponin is negative.  UA shows glucosuria but no ketones in the urine.  No evidence of urinary tract infection.  CK is elevated at about 1100.  Patient given 1 L fluid bolus and CK was rechecked and was at 980.  An additional fluid bolus was given after this.  Potassium was supplemented in the ED as well.  Her EKG shows normal sinus rhythm and is unchanged from prior.  Chest x-ray is negative.  On reevaluation and discussion of the lab findings with the patient she states that she tries to drink a lot of fluids but mainly drinks coffee and juice instead of water.  She states she is also  on lisinopril/hydrochlorothiazide.  I think this may be leading to some dehydration causing some electrolyte derangement and mild rhabdo.  Have discussed the importance of fluid hydration with the patient.  Have advised her to have repeat labs drawn with her PCP later this week and to return to the ED for any new or worsening symptoms in the meantime.  She voices an understanding of the plan and reasons to return to the ED.  All questions answered.  ED Discharge Orders    None       Bishop Dublin 01/26/18 2335    Tegeler, Gwenyth Allegra, MD 01/26/18 [phone]

## 2018-01-26 NOTE — Discharge Instructions (Addendum)
You need to stay well-hydrated over the next week.  Please follow-up with your regular doctor later this week.  Return to the emergency department if you have continued symptoms or if you experience chest pain, shortness of breath.

## 2018-01-26 NOTE — ED Triage Notes (Signed)
Sent by PCP for abnormal labs, Weight loss vomiting cramping.

## 2018-06-06 ENCOUNTER — Emergency Department (HOSPITAL_COMMUNITY): Payer: Medicare PPO

## 2018-06-06 ENCOUNTER — Other Ambulatory Visit: Payer: Self-pay

## 2018-06-06 ENCOUNTER — Encounter (HOSPITAL_COMMUNITY): Payer: Self-pay | Admitting: *Deleted

## 2018-06-06 ENCOUNTER — Emergency Department (HOSPITAL_COMMUNITY)
Admission: EM | Admit: 2018-06-06 | Discharge: 2018-06-06 | Disposition: A | Discharge status: Home / Self Care | Payer: Medicare PPO | Attending: Emergency Medicine | Admitting: Emergency Medicine

## 2018-06-06 DIAGNOSIS — M542 Cervicalgia: Secondary | ICD-10-CM | POA: Insufficient documentation

## 2018-06-06 DIAGNOSIS — J449 Chronic obstructive pulmonary disease, unspecified: Secondary | ICD-10-CM | POA: Insufficient documentation

## 2018-06-06 DIAGNOSIS — E119 Type 2 diabetes mellitus without complications: Secondary | ICD-10-CM | POA: Diagnosis not present

## 2018-06-06 DIAGNOSIS — F17210 Nicotine dependence, cigarettes, uncomplicated: Secondary | ICD-10-CM | POA: Insufficient documentation

## 2018-06-06 DIAGNOSIS — R51 Headache: Secondary | ICD-10-CM | POA: Diagnosis not present

## 2018-06-06 DIAGNOSIS — Z7984 Long term (current) use of oral hypoglycemic drugs: Secondary | ICD-10-CM | POA: Insufficient documentation

## 2018-06-06 DIAGNOSIS — M7918 Myalgia, other site: Secondary | ICD-10-CM | POA: Insufficient documentation

## 2018-06-06 DIAGNOSIS — H538 Other visual disturbances: Secondary | ICD-10-CM | POA: Diagnosis not present

## 2018-06-06 DIAGNOSIS — I10 Essential (primary) hypertension: Secondary | ICD-10-CM | POA: Diagnosis not present

## 2018-06-06 DIAGNOSIS — M25571 Pain in right ankle and joints of right foot: Secondary | ICD-10-CM | POA: Diagnosis not present

## 2018-06-06 MED ORDER — NAPROXEN 500 MG PO TABS: 500.0000 mg | ORAL_TABLET | Freq: Two times a day (BID) | ORAL | 0 refills | Status: DC

## 2018-06-06 MED ORDER — METHOCARBAMOL 500 MG PO TABS: 500.0000 mg | ORAL_TABLET | Freq: Three times a day (TID) | ORAL | 0 refills | Status: DC | PRN
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
## 2018-06-06 NOTE — ED Notes (Signed)
Contacted ortho tech to apply ASO to R ankle as ordered.

## 2018-06-06 NOTE — ED Notes (Signed)
Bed: WTR8 Expected date:  Expected time:  Means of arrival:  Comments: 
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
## 2018-07-23 ENCOUNTER — Other Ambulatory Visit: Payer: Self-pay

## 2018-07-23 ENCOUNTER — Emergency Department (HOSPITAL_COMMUNITY)
Admission: EM | Admit: 2018-07-23 | Discharge: 2018-07-24 | Disposition: A | Discharge status: Home / Self Care | Payer: Medicare PPO | Attending: Emergency Medicine | Admitting: Emergency Medicine

## 2018-07-23 ENCOUNTER — Encounter (HOSPITAL_COMMUNITY): Payer: Self-pay

## 2018-07-23 ENCOUNTER — Emergency Department (HOSPITAL_COMMUNITY): Payer: Medicare PPO

## 2018-07-23 DIAGNOSIS — Z79899 Other long term (current) drug therapy: Secondary | ICD-10-CM | POA: Insufficient documentation

## 2018-07-23 DIAGNOSIS — F419 Anxiety disorder, unspecified: Secondary | ICD-10-CM | POA: Diagnosis not present

## 2018-07-23 DIAGNOSIS — J069 Acute upper respiratory infection, unspecified: Secondary | ICD-10-CM

## 2018-07-23 DIAGNOSIS — E876 Hypokalemia: Secondary | ICD-10-CM | POA: Diagnosis not present

## 2018-07-23 DIAGNOSIS — Z7984 Long term (current) use of oral hypoglycemic drugs: Secondary | ICD-10-CM | POA: Diagnosis not present

## 2018-07-23 DIAGNOSIS — R0981 Nasal congestion: Secondary | ICD-10-CM | POA: Diagnosis present

## 2018-07-23 DIAGNOSIS — J449 Chronic obstructive pulmonary disease, unspecified: Secondary | ICD-10-CM | POA: Insufficient documentation

## 2018-07-23 DIAGNOSIS — D649 Anemia, unspecified: Secondary | ICD-10-CM | POA: Diagnosis not present

## 2018-07-23 DIAGNOSIS — I10 Essential (primary) hypertension: Secondary | ICD-10-CM | POA: Insufficient documentation

## 2018-07-23 DIAGNOSIS — F17210 Nicotine dependence, cigarettes, uncomplicated: Secondary | ICD-10-CM | POA: Diagnosis not present

## 2018-07-23 DIAGNOSIS — E119 Type 2 diabetes mellitus without complications: Secondary | ICD-10-CM | POA: Diagnosis not present

## 2018-07-23 DIAGNOSIS — Z20828 Contact with and (suspected) exposure to other viral communicable diseases: Secondary | ICD-10-CM | POA: Diagnosis not present

## 2018-07-23 DIAGNOSIS — B9789 Other viral agents as the cause of diseases classified elsewhere: Secondary | ICD-10-CM

## 2018-07-23 DIAGNOSIS — R0602 Shortness of breath: Secondary | ICD-10-CM | POA: Insufficient documentation

## 2018-07-23 LAB — BASIC METABOLIC PANEL
Anion gap: 11 (ref 5–15)
BUN: 9 mg/dL (ref 6–20)
CO2: 28 mmol/L (ref 22–32)
Calcium: 8.9 mg/dL (ref 8.9–10.3)
Chloride: 96 mmol/L — ABNORMAL LOW (ref 98–111)
Creatinine, Ser: 0.66 mg/dL (ref 0.44–1.00)
GFR calc Af Amer: >60 mL/min (ref >60)
GFR calc non Af Amer: >60 mL/min (ref >60)
Glucose, Bld: 106 mg/dL — ABNORMAL HIGH (ref 70–99)
Potassium: 3.2 mmol/L — ABNORMAL LOW (ref 3.5–5.1)
Sodium: 135 mmol/L (ref 135–145)

## 2018-07-23 LAB — CBC WITH DIFFERENTIAL/PLATELET
Abs Immature Granulocytes: 0.03 10*3/uL (ref 0.00–0.07)
Basophils Absolute: 0 10*3/uL (ref 0.0–0.1)
Basophils Relative: 0 %
Eosinophils Absolute: 0.1 10*3/uL (ref 0.0–0.5)
Eosinophils Relative: 1 %
HCT: 30.4 % — ABNORMAL LOW (ref 36.0–46.0)
Hemoglobin: 8.9 g/dL — ABNORMAL LOW (ref 12.0–15.0)
Immature Granulocytes: 0 %
Lymphocytes Relative: 18 %
Lymphs Abs: 2 10*3/uL (ref 0.7–4.0)
MCH: 19.5 pg — ABNORMAL LOW (ref 26.0–34.0)
MCHC: 29.3 g/dL — ABNORMAL LOW (ref 30.0–36.0)
MCV: 66.7 fL — ABNORMAL LOW (ref 80.0–100.0)
Monocytes Absolute: 1 10*3/uL (ref 0.1–1.0)
Monocytes Relative: 9 %
Neutro Abs: 7.6 10*3/uL (ref 1.7–7.7)
Neutrophils Relative %: 72 %
Platelets: 267 10*3/uL (ref 150–400)
RBC: 4.56 MIL/uL (ref 3.87–5.11)
RDW: 20.6 % — ABNORMAL HIGH (ref 11.5–15.5)
WBC: 10.8 10*3/uL — ABNORMAL HIGH (ref 4.0–10.5)
nRBC: 0 % (ref 0.0–0.2)

## 2018-07-23 MED ORDER — AEROCHAMBER PLUS FLO-VU MEDIUM MISC
1.0000 | Freq: Once | Status: AC
  Administered 2018-07-23: 1
  Filled 2018-07-23: qty 1

## 2018-07-23 MED ORDER — ALBUTEROL SULFATE HFA 108 (90 BASE) MCG/ACT IN AERS
6.0000 | INHALATION_SPRAY | Freq: Once | RESPIRATORY_TRACT | Status: AC
  Administered 2018-07-23: 6 via RESPIRATORY_TRACT
  Filled 2018-07-23: qty 6.7

## 2018-07-23 NOTE — ED Provider Notes (Signed)
[facility] DEPT Provider Note   CSN: [id number] Arrival date & time: 07/23/18  2051    History   Chief Complaint Chief Complaint  Patient presents with  . Cough    HPI Sandra Bean is a 58 y.o. female.     HPI   Pt is a 58 y/o female with a h/o anemia, anxiety, COPD, depression, diabetes, GERD, HTN, MVP, who presents to the ED today for eval of URI sxs.  Patient states that about a week ago she developed rhinorrhea, nasal congestion, sneezing.  Symptoms seem to worsen and she began to have sinus pressure/pain to the maxillary and frontal sinuses starting several days later.  She started using saline solution.    She also states that along with the symptoms she has had a cough for several weeks due to the increased pollen recently and environmental changes.  A few days ago the cough seemed to worsen and she started to have a productive cough with yellow/green mucus.  She is concerned that she may have pneumonia.  States that she has little bit of shortness of breath and orthopnea.  She has chest wall pain and back pain that is only present when she coughs.  No persistent chest pain.  No pain with inspiration.  No lower extremity swelling or hemoptysis.  No known fevers.  No recent hospital admissions or surgeries.  No known sick contacts or COVID exposures. She has lost her sense of taste as well.    She states that she had a telehealth visit with her physician on Wednesday and was diagnosed with a sinus infection.  She was started on clindamycin, Mucinex and Flonase which she has been using.  States that her rhinitis symptoms have seemed to improve however the cough is not which is why she came to the ED.  States she is supposed to take medications for COPD but she does not because she feels like she takes too many medications. Pt smokes 1 PPD.   Past Medical History:  Diagnosis Date  . Anemia   . Anxiety    PTDS  . Arthritis    RIGHT SIDE  . COPD  (chronic obstructive pulmonary disease) ([place])   . Depression   . Diabetes mellitus without complication (HCC)    Type 2  . Fibroids   . Fibromyalgia    DAILY PAIN  . GERD (gastroesophageal reflux disease)   . H/O hernia repair 2009  . Headache   . Hypertension   . Hyperthyroidism    NON PER PATIENT  . Mental disorder   . Mitral valve prolapse   . Ovarian cyst     Patient Active Problem List   Diagnosis Date Noted  . Pelvic peritoneal adhesions, female 01/19/2017  . Anemia due to chronic illness 01/19/2017  . Benign essential hypertension 01/19/2017  . Tobacco abuse 01/19/2017  . Post-operative state 01/19/2017  . Fibroid uterus 10/26/2016  . Abdominal pain in female 10/26/2016  . Bacterial vaginitis 10/26/2016    Past Surgical History:  Procedure Laterality Date  . FOOT SURGERY    . HERNIA REPAIR    . LAPAROTOMY Bilateral 01/19/2017   Procedure: MINI-LAPAROTOMY TO REMOVE UTERUS, BILATERAL FALLOPIAN TUBES AND BILATERAL OVARIES;  Surgeon: Lavonia Drafts, MD;  Location: [facility];  Service: Gynecology;  Laterality: Bilateral;  . NOSE SURGERY    . ROBOTIC ASSISTED TOTAL HYSTERECTOMY WITH BILATERAL SALPINGO OOPHERECTOMY Bilateral 01/19/2017   Procedure: ROBOTIC ASSISTED TOTAL LAPAROSCOPIC SUPRACERVICAL HYSTERECTOMY WITH BILATERAL SALPINGO OOPHORECTOMY  WITH MINI LAPAROTOMY TO REMOVE SPECIMEN;  Surgeon: Lavonia Drafts, MD;  Location: [facility];  Service: Gynecology;  Laterality: Bilateral;  . THERAPEUTIC ABORTION       OB History    Gravida  3   Para      Term      Preterm      AB  3   Living        SAB      TAB  3   Ectopic      Multiple      Live Births               Home Medications    Prior to Admission medications   Medication Sig Start Date End Date Taking? Authorizing Provider  amLODipine (NORVASC) 5 MG tablet Take 5 mg by mouth daily.  10/01/16   [provider]  amoxicillin-clavulanate (AUGMENTIN) 875-125 MG tablet  Take 1 tablet by mouth every 12 (twelve) hours for 7 days. 07/24/18 07/31/18  Sharea Guinther S, PA-C  aspirin EC 325 MG tablet Take 975 mg by mouth daily as needed for mild pain (headaches).    [provider]  atorvastatin (LIPITOR) 20 MG tablet Take 20 mg by mouth daily.  02/03/17 02/03/18  [provider]  clonazePAM (KLONOPIN) 0.5 MG tablet Take 0.25-0.5 mg by mouth daily as needed for anxiety.    [provider]  desvenlafaxine (PRISTIQ) 100 MG 24 hr tablet Take 100 mg by mouth daily.  01/23/18   [provider]  Fe Fum-FePoly-FA-Vit C-Vit B3 (INTEGRA F) 125-1 MG CAPS Take 1 capsule by mouth daily. Patient not taking: Reported on 01/26/2018 02/09/17   Lavonia Drafts, MD  ferrous sulfate 325 (65 FE) MG tablet Take 1 tablet (325 mg total) by mouth daily for 7 days. 07/24/18 07/31/18  Marco Adelson S, PA-C  ibuprofen (ADVIL,MOTRIN) 600 MG tablet Take 1 tablet (600 mg total) by mouth every 6 (six) hours as needed (mild pain). Patient not taking: Reported on 04/06/2017 01/20/17   Lavonia Drafts, MD  JARDIANCE 10 MG TABS tablet Take 10 mg by mouth daily.  01/24/18   [provider]  losartan-hydrochlorothiazide (HYZAAR) 100-25 MG tablet Take 1 tablet by mouth once daily    [provider]  metFORMIN (GLUCOPHAGE) 500 MG tablet Take 1 tablet (500 mg total) by mouth 2 (two) times daily with a meal. Patient taking differently: Take 1,000 mg by mouth 2 (two) times daily with a meal.  02/09/17   Lavonia Drafts, MD  methocarbamol (ROBAXIN) 500 MG tablet Take 1 tablet (500 mg total) by mouth every 8 (eight) hours as needed. 06/06/18   Petrucelli, Samantha R, PA-C  metoprolol tartrate (LOPRESSOR) 50 MG tablet Take 50 mg by mouth daily.  10/01/16   [provider]  Multiple Vitamin (MULTIVITAMIN IRON-FREE) TABS Take 1 tablet by mouth daily.    [provider]  Multiple Vitamins-Minerals (HAIR SKIN AND NAILS FORMULA) TABS  Take 1 tablet by mouth daily.     [provider]  naproxen (NAPROSYN) 500 MG tablet Take 1 tablet (500 mg total) by mouth 2 (two) times daily. 06/06/18   Petrucelli, Samantha R, PA-C  omeprazole (PRILOSEC) 20 MG capsule Take 20 mg by mouth daily.     [provider]  potassium chloride (K-DUR) 10 MEQ tablet Take 1 tablet (10 mEq total) by mouth daily for 4 days. 07/24/18 07/28/18  Alphia Behanna S, PA-C  pramipexole (MIRAPEX) 1.5 MG tablet Take 1.5 mg by  mouth at bedtime.  12/12/17   [provider]  prazosin (MINIPRESS) 5 MG capsule Take 5 mg by mouth at bedtime.  12/12/17   [provider]  sodium chloride (OCEAN) 0.65 % SOLN nasal spray Place 1 spray into both nostrils daily as needed for congestion.     [provider]  traZODone (DESYREL) 100 MG tablet Take 200-300 mg by mouth at bedtime as needed for sleep.     [provider]    Family History History reviewed. No pertinent family history.  Social History Social History   Tobacco Use  . Smoking status: Current Every Day Smoker    Packs/day: 0.50    Types: Cigarettes  . Smokeless tobacco: Never Used  Substance Use Topics  . Alcohol use: No  . Drug use: No     Allergies   Famotidine and Mirtazapine   Review of Systems Review of Systems  Constitutional: Negative for fever.  HENT: Positive for congestion, postnasal drip, rhinorrhea, sinus pressure and sinus pain. Negative for ear pain and sore throat.   Eyes: Negative for visual disturbance.  Respiratory: Positive for cough and shortness of breath.   Cardiovascular: Negative for chest pain and leg swelling.  Gastrointestinal: Positive for diarrhea. Negative for abdominal pain, constipation, nausea and vomiting.  Genitourinary: Negative for dysuria and hematuria.  Musculoskeletal: Negative for back pain.  Skin: Negative for rash.  Neurological: Negative for syncope, light-headedness and numbness.  All other systems reviewed  and are negative.    Physical Exam Updated Vital Signs BP 125/65   Pulse (!) 102   Temp 99.3 F (37.4 C) (Oral)   Resp 18   Ht 5\' 6"  (1.676 m)   Wt 78.1 kg   SpO2 96%   BMI 27.79 kg/m   Physical Exam Vitals signs and nursing note reviewed.  Constitutional:      General: She is not in acute distress.    Appearance: She is well-developed. She is not ill-appearing or toxic-appearing.  HENT:     Head: Normocephalic and atraumatic.     Nose: Nose normal.     Mouth/Throat:     Comments: No frontal or maxillary sinus tenderness Eyes:     Conjunctiva/sclera: Conjunctivae normal.  Neck:     Musculoskeletal: Neck supple.  Cardiovascular:     Rate and Rhythm: Normal rate and regular rhythm.     Pulses: Normal pulses.     Heart sounds: Normal heart sounds. No murmur.  Pulmonary:     Effort: Pulmonary effort is normal. Prolonged expiration present. No tachypnea.     Breath sounds: Examination of the right-upper field reveals decreased breath sounds. Examination of the left-upper field reveals decreased breath sounds. Examination of the right-middle field reveals decreased breath sounds. Examination of the left-middle field reveals decreased breath sounds. Examination of the right-lower field reveals decreased breath sounds. Examination of the left-lower field reveals decreased breath sounds. Decreased breath sounds and wheezing (end expiratory wheezing) present. No rhonchi or rales.  Abdominal:     General: Bowel sounds are normal.     Palpations: Abdomen is soft.     Tenderness: There is no abdominal tenderness. There is no guarding or rebound.  Musculoskeletal:        General: No tenderness.     Right lower leg: No edema.     Left lower leg: No edema.  Skin:    General: Skin is warm and dry.  Neurological:     Mental Status: She is alert and  oriented to person, place, and time.  Psychiatric:        Mood and Affect: Mood normal.      ED Treatments / Results  Labs (all  labs ordered are listed, but only abnormal results are displayed) Labs Reviewed  CBC WITH DIFFERENTIAL/PLATELET - Abnormal; Notable for the following components:      Result Value   WBC 10.8 (*)    Hemoglobin 8.9 (*)    HCT 30.4 (*)    MCV 66.7 (*)    MCH 19.5 (*)    MCHC 29.3 (*)    RDW 20.6 (*)    All other components within normal limits  BASIC METABOLIC PANEL - Abnormal; Notable for the following components:   Potassium 3.2 (*)    Chloride 96 (*)    Glucose, Bld 106 (*)    All other components within normal limits  NOVEL CORONAVIRUS, NAA (HOSPITAL ORDER, SEND-OUT TO REF LAB)  BRAIN NATRIURETIC PEPTIDE    EKG None  Radiology Dg Chest Portable 1 View  Result Date: 07/23/2018 CLINICAL DATA:  Cough EXAM: PORTABLE CHEST 1 VIEW COMPARISON:  03/28/2017 FINDINGS: Heart is borderline in size. Lungs clear. No effusions or acute bony abnormality. IMPRESSION: No active disease. Electronically Signed   By: Rolm Baptise M.D.   On: 07/23/2018 22:33    Procedures Procedures (including critical care time)  Medications Ordered in ED Medications  albuterol (VENTOLIN HFA) 108 (90 Base) MCG/ACT inhaler 6 puff (6 puffs Inhalation Given 07/23/18 2239)  AeroChamber Plus Flo-Vu Medium MISC 1 each (1 each Other Given 07/23/18 2239)  acetaminophen (TYLENOL) tablet 650 mg (650 mg Oral Given 07/24/18 0012)     Initial Impression / Assessment and Plan / ED Course  I have reviewed the triage vital signs and the nursing notes.  Pertinent labs & imaging results that were available during my care of the patient were reviewed by me and considered in my medical decision making (see chart for details).   Final Clinical Impressions(s) / ED Diagnoses   Final diagnoses:  Viral URI with cough  Anemia, unspecified type  Hypokalemia   Pt is a 58 y/o female with a h/o anemia, anxiety, COPD, depression, diabetes, GERD, HTN, MVP, who presents to the ED today for eval of URI sxs.  Currently being tx for  sinusitis with clinda, mucinex, and flonase. Rhinitis sxs improved upon taking these meds but cough has not. Denies chest pain or ble swelling. No pe risk factors. Perc negative. VSS in the ED. NAD.   No known sick contacts or COVID exposures.    Cbc with mild to be cytosis.  Anemia is also present however appears to be somewhat consistent with patient's baseline.  I do not think that it is contributing to her symptoms today.  I will start her on iron supplementation. Bmp with mild hypokalemia.  Normal kidney function.  Will give Rx for potassium supplementation. bnp is negative, low suspicion for heart failure.   cxr does not show any evidence of pneumonia.  No evidence of pulmonary edema or pneumothorax  Pt given albuterol inhaler in the ED and she states sxs have improved greatly. I discussed results of labs showing anemia and hypokalemia. Discussed cxr and plan to switch abx from clinda to augmentin to cover sinusitis and potential bacterial pneumonia given her h/o tobacco use and several day h/o cough. Will also test patient for COVID 19. Advised self quarantine and close pcp f/u. Specific return precautions discussed. She voices understanding and is  in agreement with plan. All questions answered.   Sandra Bean was evaluated in Emergency Department on 07/24/2018 for the symptoms described in the history of present illness. She was evaluated in the context of the global COVID-19 pandemic, which necessitated consideration that the patient might be at risk for infection with the SARS-CoV-2 virus that causes COVID-19. Institutional protocols and algorithms that pertain to the evaluation of patients at risk for COVID-19 are in a state of rapid change based on information released by regulatory bodies including the CDC and federal and state organizations. These policies and algorithms were followed during the patient's care in the ED.   ED Discharge Orders         Ordered    ferrous sulfate 325 (65  FE) MG tablet  Daily     07/24/18 0025    potassium chloride (K-DUR) 10 MEQ tablet  Daily     07/24/18 0025    amoxicillin-clavulanate (AUGMENTIN) 875-125 MG tablet  Every 12 hours     07/24/18 0025           Makhia Vosler S, PA-C 07/24/18 0026    Lacretia Leigh, MD 07/24/18 2238

## 2018-07-23 NOTE — ED Triage Notes (Addendum)
States headache, neck, chest tightness and coughing up greenish/beige secretions and thinks she might have pneumonia. Pt on clindamycin, mucinex, and Flonase for cough.

## 2018-07-24 LAB — BRAIN NATRIURETIC PEPTIDE: B Natriuretic Peptide: 40.1 pg/mL (ref 0.0–100.0)

## 2018-07-24 MED ORDER — FERROUS SULFATE 325 (65 FE) MG PO TABS: 325.0000 mg | ORAL_TABLET | Freq: Every day | ORAL | 0 refills | Status: DC

## 2018-07-24 MED ORDER — POTASSIUM CHLORIDE ER 10 MEQ PO TBCR: 10.0000 meq | EXTENDED_RELEASE_TABLET | Freq: Every day | ORAL | 0 refills | Status: DC

## 2018-07-24 MED ORDER — AMOXICILLIN-POT CLAVULANATE 875-125 MG PO TABS: 1.0000 | ORAL_TABLET | Freq: Two times a day (BID) | ORAL | 0 refills | Status: AC

## 2018-07-24 MED ORDER — ACETAMINOPHEN 325 MG PO TABS
650.0000 mg | ORAL_TABLET | Freq: Once | ORAL | Status: AC
  Administered 2018-07-24: 650 mg via ORAL
  Filled 2018-07-24: qty 2

## 2018-07-24 NOTE — Discharge Instructions (Addendum)
You were given a prescription for antibiotics. Please take the antibiotic prescription fully.   Take the potassium and iron supplementation as recommended.  Stay well hydrated. Treat fevers with tylenol and motrin. Take 2 puffs of the albuterol inhaler every 4-6 hours as needed for cough, shortness of breath, or wheezing.   You should be isolated for at least 7 days since the onset of your symptoms AND >72 hours after symptoms resolution (absence of fever without the use of fever reducing medication and improvement in respiratory symptoms), whichever is longer  Please follow up with your primary care provider within 5-7 days for re-evaluation of your symptoms. If you do not have a primary care provider, information for a healthcare clinic has been provided for you to make arrangements for follow up care. Please return to the emergency department for any new or worsening symptoms.

## 2018-07-25 LAB — NOVEL CORONAVIRUS, NAA (HOSP ORDER, SEND-OUT TO REF LAB; TAT 18-24 HRS): SARS-CoV-2, NAA: NOT DETECTED

## 2018-08-10 ENCOUNTER — Other Ambulatory Visit: Payer: Self-pay

## 2018-08-10 ENCOUNTER — Encounter (HOSPITAL_COMMUNITY): Payer: Self-pay | Admitting: Psychiatry

## 2018-08-10 ENCOUNTER — Ambulatory Visit (INDEPENDENT_AMBULATORY_CARE_PROVIDER_SITE_OTHER): Payer: Medicare PPO | Admitting: Psychiatry

## 2018-08-10 DIAGNOSIS — F5102 Adjustment insomnia: Secondary | ICD-10-CM | POA: Diagnosis not present

## 2018-08-10 DIAGNOSIS — F411 Generalized anxiety disorder: Secondary | ICD-10-CM

## 2018-08-10 DIAGNOSIS — F4310 Post-traumatic stress disorder, unspecified: Secondary | ICD-10-CM | POA: Diagnosis not present

## 2018-08-10 DIAGNOSIS — F331 Major depressive disorder, recurrent, moderate: Secondary | ICD-10-CM | POA: Diagnosis not present
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
## 2018-08-17 DIAGNOSIS — D509 Iron deficiency anemia, unspecified: Secondary | ICD-10-CM | POA: Insufficient documentation

## 2018-08-25 ENCOUNTER — Telehealth: Payer: Self-pay | Admitting: Obstetrics & Gynecology

## 2018-08-25 NOTE — Telephone Encounter (Signed)
Patient called to make an appointment with Dr Ihor Dow. I informed her due to COVID-19 she was not seeing patients in this office yet face to face. However, you may be able to see her at her [facility] office because it was a smaller office, and she might could see her there.

## 2018-08-31 ENCOUNTER — Ambulatory Visit (INDEPENDENT_AMBULATORY_CARE_PROVIDER_SITE_OTHER): Payer: Medicare PPO | Admitting: Licensed Clinical Social Worker

## 2018-08-31 DIAGNOSIS — F411 Generalized anxiety disorder: Secondary | ICD-10-CM | POA: Diagnosis not present

## 2018-08-31 DIAGNOSIS — F5102 Adjustment insomnia: Secondary | ICD-10-CM | POA: Diagnosis not present

## 2018-08-31 DIAGNOSIS — F331 Major depressive disorder, recurrent, moderate: Secondary | ICD-10-CM | POA: Diagnosis not present

## 2018-08-31 DIAGNOSIS — F4310 Post-traumatic stress disorder, unspecified: Secondary | ICD-10-CM

## 2018-08-31 DIAGNOSIS — I83819 Varicose veins of unspecified lower extremities with pain: Secondary | ICD-10-CM | POA: Insufficient documentation
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
## 2018-09-06 ENCOUNTER — Ambulatory Visit (INDEPENDENT_AMBULATORY_CARE_PROVIDER_SITE_OTHER): Payer: Medicare PPO | Admitting: Licensed Clinical Social Worker

## 2018-09-06 DIAGNOSIS — F4310 Post-traumatic stress disorder, unspecified: Secondary | ICD-10-CM | POA: Diagnosis not present

## 2018-09-06 DIAGNOSIS — F5102 Adjustment insomnia: Secondary | ICD-10-CM

## 2018-09-06 DIAGNOSIS — F331 Major depressive disorder, recurrent, moderate: Secondary | ICD-10-CM

## 2018-09-06 DIAGNOSIS — F411 Generalized anxiety disorder: Secondary | ICD-10-CM | POA: Diagnosis not present
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
## 2018-09-12 ENCOUNTER — Ambulatory Visit (INDEPENDENT_AMBULATORY_CARE_PROVIDER_SITE_OTHER): Payer: Medicare PPO | Admitting: Licensed Clinical Social Worker

## 2018-09-12 DIAGNOSIS — F5102 Adjustment insomnia: Secondary | ICD-10-CM

## 2018-09-12 DIAGNOSIS — F411 Generalized anxiety disorder: Secondary | ICD-10-CM | POA: Diagnosis not present

## 2018-09-12 DIAGNOSIS — F331 Major depressive disorder, recurrent, moderate: Secondary | ICD-10-CM

## 2018-09-12 DIAGNOSIS — E785 Hyperlipidemia, unspecified: Secondary | ICD-10-CM | POA: Insufficient documentation

## 2018-09-12 DIAGNOSIS — F4310 Post-traumatic stress disorder, unspecified: Secondary | ICD-10-CM
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
## 2018-09-21 ENCOUNTER — Encounter (HOSPITAL_COMMUNITY): Payer: Self-pay | Admitting: Psychiatry

## 2018-09-21 ENCOUNTER — Ambulatory Visit (INDEPENDENT_AMBULATORY_CARE_PROVIDER_SITE_OTHER): Payer: Medicare PPO | Admitting: Psychiatry

## 2018-09-21 DIAGNOSIS — F5102 Adjustment insomnia: Secondary | ICD-10-CM

## 2018-09-21 DIAGNOSIS — F331 Major depressive disorder, recurrent, moderate: Secondary | ICD-10-CM

## 2018-09-21 DIAGNOSIS — F4310 Post-traumatic stress disorder, unspecified: Secondary | ICD-10-CM | POA: Diagnosis not present

## 2018-09-21 DIAGNOSIS — F411 Generalized anxiety disorder: Secondary | ICD-10-CM

## 2018-09-21 MED ORDER — DESVENLAFAXINE SUCCINATE ER 100 MG PO TB24: 100.0000 mg | ORAL_TABLET | Freq: Every day | ORAL | 0 refills | Status: DC

## 2018-09-21 MED ORDER — PRAMIPEXOLE DIHYDROCHLORIDE 1.5 MG PO TABS: 1.5000 mg | ORAL_TABLET | Freq: Every day | ORAL | 0 refills | Status: DC
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
## 2018-09-22 ENCOUNTER — Ambulatory Visit (INDEPENDENT_AMBULATORY_CARE_PROVIDER_SITE_OTHER): Payer: Medicare PPO | Admitting: Licensed Clinical Social Worker

## 2018-09-22 DIAGNOSIS — F4310 Post-traumatic stress disorder, unspecified: Secondary | ICD-10-CM | POA: Diagnosis not present

## 2018-09-22 DIAGNOSIS — F331 Major depressive disorder, recurrent, moderate: Secondary | ICD-10-CM | POA: Diagnosis not present

## 2018-09-22 DIAGNOSIS — F411 Generalized anxiety disorder: Secondary | ICD-10-CM | POA: Diagnosis not present

## 2018-09-22 NOTE — Addendum Note (Signed)
Addended by: [name] A on: 09/22/2018 06:07 PM   Modules accepted: Level of Service

## 2018-09-22 NOTE — Progress Notes (Addendum)
Virtual Visit via Video Note  I connected with Sandra Bean on 09/22/18 at  2:00 PM EDT by a video enabled telemedicine application and verified that I am speaking with the correct person using two identifiers.   I discussed the limitations of evaluation and management by telemedicine and the availability of in person appointments. The patient expressed understanding and agreed to proceed.  Follow Up Instructions:    I discussed the assessment and treatment plan with the patient. The patient was provided an opportunity to ask questions and all were answered. The patient agreed with the plan and demonstrated an understanding of the instructions.   The patient was advised to call back or seek an in-person evaluation if the symptoms worsen or if the condition fails to improve as anticipated.  I provided 52 minutes of non-face-to-face time during this encounter.   THERAPIST PROGRESS NOTE  Session Time: 2:02 PM to 2:54 PM  Participation Level: Active  Behavioral Response: CasualAlertEuthymic  Type of Therapy: Individual Therapy  Treatment Goals addressed:  Processed through trauma to let go the past, challenge schemas from the past which are distorted and damaging the present, decrease trauma symptoms, coping Interventions: Solution Focused, Strength-based, Supportive and Other: coping  Summary: Sandra Bean is a 58 y.o. female who presents with stimulation of past, crazy with being home, motivation down, up about going some places, today is good. Also trauma, and racial stuff. Doing filing, putting things together. Can move on to other projects, lost 40 have another 15 to go. Getting there, today was a goo day, motivating, took the worst part, the rest will be fun. Today is the beginning I haven't done anything in two weeks. The fact that I had enough energy, felt energerized and moving forward, address depression, that is what is going on. I am not the as depressed as can get, haven't  felt like doing anything. Plans cancelled due to coronavirus. Why understand no motivation, directly associated with bad news. Peace when I am there. I miss it. Planned. Go down to a hotel.Go out to walk dog, grocery store, doctor's house. Right now anemic, infusions, bleeding, colonoscopy, endoscopy, MRI ankle really hurt. I want to cry pain, Pointed chisel, hammer, mallet on it, pain going through bone and hurts. MRI feels better. Doesn't want anything to happen. Walk every day. 4-5 miles every other day, brace on ankle. Doesn't know why.  Take charge and tell them what needs to be done. We know our body. Discussed experiences with doctors, not giving her the right direction. November 2019. Came here April 2018.  Why life change due to problems with prediabetes, change what eat, what put in system, feel like a new person Shared that there has not been longevity of life in family history.  Reviewed trauma that mom died in her arms.   Suicidal/Homicidal: No  Therapist Response: Assess patient current functioning per report and processed feelings related to current stressors.  Validated patient on how she was feeling discussed coping strategies.  And pointed that taking charge in addressing life issues such as medical issues you have to do sometimes and provided positive feedback for patient doing this with her care.  Reviewed symptom of depression is lack of motivation and one strategy is to just do something even if it is for 5 minutes and that can increase once motivation.  Reviewed psychiatric appointment in identifying talking about trauma as well as coronavirus has increased symptoms, talk specifically about plans this summer to spend  at the beach that have been canceled that impacted mood. Provided positive feedback for patient's initiated as she is taken related to refurbishing her furniture, positive steps she has taken and changing her diet and losing weight. Reviewed history of medical issue with  ankle that contributes to current stressors with better understanding why it is a significant stress for her. Has gathered information that will be helpful in connecting events in her life that will help with treatment interventions and trauma work. Bided strength based and supportive intervention. Plan: Return again in 1 weeks.2.  Post work with patient on effective coping strategies, and trauma focused interventions.  Diagnosis: Axis I: major depressive disorder, recurrent, moderate, generalized anxiety disorder, PTSD, adjustment insomnia    Axis II: No diagnosis    [name], LCSW 09/22/2018

## 2018-09-28 ENCOUNTER — Telehealth (HOSPITAL_COMMUNITY): Payer: Self-pay

## 2018-09-28 ENCOUNTER — Other Ambulatory Visit (HOSPITAL_COMMUNITY): Payer: Self-pay

## 2018-09-28 MED ORDER — DESVENLAFAXINE SUCCINATE ER 100 MG PO TB24: 100.0000 mg | ORAL_TABLET | Freq: Every day | ORAL | 0 refills | Status: DC

## 2018-09-28 MED ORDER — PRAMIPEXOLE DIHYDROCHLORIDE 1.5 MG PO TABS: 1.5000 mg | ORAL_TABLET | Freq: Every day | ORAL | 0 refills | Status: DC

## 2018-09-28 NOTE — Telephone Encounter (Signed)
Patient states she spoke with [facility] and they did not have her rx's that Dr. De Nurse sent in on 09/21/18. I looked in chart and rx's were sent in to the [facility] for their employees. I recent the medications to the correct [facility]

## 2018-09-29 ENCOUNTER — Ambulatory Visit (INDEPENDENT_AMBULATORY_CARE_PROVIDER_SITE_OTHER): Payer: Medicare PPO | Admitting: Licensed Clinical Social Worker

## 2018-09-29 DIAGNOSIS — F331 Major depressive disorder, recurrent, moderate: Secondary | ICD-10-CM

## 2018-09-29 DIAGNOSIS — F4310 Post-traumatic stress disorder, unspecified: Secondary | ICD-10-CM

## 2018-09-29 DIAGNOSIS — F411 Generalized anxiety disorder: Secondary | ICD-10-CM
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
## 2018-10-03 ENCOUNTER — Ambulatory Visit (INDEPENDENT_AMBULATORY_CARE_PROVIDER_SITE_OTHER): Payer: Medicare PPO | Admitting: Licensed Clinical Social Worker

## 2018-10-03 DIAGNOSIS — F4310 Post-traumatic stress disorder, unspecified: Secondary | ICD-10-CM | POA: Diagnosis not present

## 2018-10-03 DIAGNOSIS — F411 Generalized anxiety disorder: Secondary | ICD-10-CM | POA: Diagnosis not present

## 2018-10-03 DIAGNOSIS — F331 Major depressive disorder, recurrent, moderate: Secondary | ICD-10-CM
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
## 2018-10-07 ENCOUNTER — Other Ambulatory Visit: Payer: Self-pay

## 2018-10-07 ENCOUNTER — Ambulatory Visit (INDEPENDENT_AMBULATORY_CARE_PROVIDER_SITE_OTHER): Payer: Medicare PPO | Admitting: Obstetrics & Gynecology

## 2018-10-07 ENCOUNTER — Encounter: Payer: Self-pay | Admitting: Obstetrics & Gynecology

## 2018-10-07 ENCOUNTER — Other Ambulatory Visit (HOSPITAL_COMMUNITY)
Admission: RE | Admit: 2018-10-07 | Discharge: 2018-10-07 | Disposition: A | Discharge status: Home / Self Care | Payer: Medicare PPO | Source: Ambulatory Visit | Attending: Obstetrics & Gynecology | Admitting: Obstetrics & Gynecology

## 2018-10-07 VITALS — BP 140/86 | HR 79 | Ht 65.0 in | Wt 165.6 lb

## 2018-10-07 DIAGNOSIS — N951 Menopausal and female climacteric states: Secondary | ICD-10-CM

## 2018-10-07 DIAGNOSIS — Z01419 Encounter for gynecological examination (general) (routine) without abnormal findings: Secondary | ICD-10-CM | POA: Insufficient documentation

## 2018-10-07 NOTE — Progress Notes (Signed)
Subjective:     Sandra Bean is a 58 y.o. female here for a routine exam.  Pt is s/p RATH converted to supraacervical hyst 01/19/2017.  Current complaints: none. She attends [employer].  Her niece moved out. Lives on her own now.      Gynecologic History No LMP recorded. Patient is postmenopausal. Contraception: status post hysterectomy Last Pap: 10/29/2016. Results were: normal Last mammogram: 09/28/2018 Results were: BiRad 2. Pt aware  Obstetric History OB History  Gravida Para Term Preterm AB Living  3       3    SAB TAB Ectopic Multiple Live Births    3          # Outcome Date GA Lbr Len/2nd Weight Sex Delivery Anes PTL Lv  3 TAB           2 TAB           1 TAB            The following portions of the patient's history were reviewed and updated as appropriate: allergies, current medications, past family history, past medical history, past social history, past surgical history and problem list.  Review of Systems Pertinent items are noted in HPI.    Objective:  BP 140/86   Pulse 79   Ht 5\' 5"  (1.651 m)   Wt 165 lb 9.6 oz (75.1 kg)   BMI 27.56 kg/m   General Appearance:    Alert, cooperative, no distress, appears stated age  Head:    Normocephalic, without obvious abnormality, atraumatic  Eyes:    conjunctiva/corneas clear, EOM's intact, both eyes  Ears:    Normal external ear canals, both ears  Nose:   Nares normal, septum midline, mucosa normal, no drainage    or sinus tenderness  Throat:   Lips, mucosa, and tongue normal; teeth and gums normal  Neck:   Supple, symmetrical, trachea midline, no adenopathy;    thyroid:  no enlargement/tenderness/nodules  Back:     Symmetric, no curvature, ROM normal, no CVA tenderness  Lungs:     Clear to auscultation bilaterally, respirations unlabored  Chest Wall:    No tenderness or deformity   Heart:    Regular rate and rhythm, S1 and S2 normal, no murmur, rub   or gallop  Breast Exam:    No tenderness, masses, or nipple  abnormality  Abdomen:     Soft, non-tender, bowel sounds active all four quadrants,    no masses, no organomegaly  Genitalia:    Normal female without lesion, discharge or tenderness; cervix in place     Extremities:   Extremities normal, atraumatic, no cyanosis or edema  Pulses:   2+ and symmetric all extremities  Skin:   Skin color, texture, turgor normal, no rashes or lesions    Assessment:    Healthy female exam.   s/p Hyst Menopausal state    Plan:    Follow up in: 1 year.    F/u PAP with hrHPV testing   Sandra Bean L. Harraway-Smith, M.D., Sandra Bean

## 2018-10-11 LAB — CYTOLOGY - PAP
Diagnosis: NEGATIVE
HPV: NOT DETECTED

## 2018-10-19 ENCOUNTER — Ambulatory Visit (INDEPENDENT_AMBULATORY_CARE_PROVIDER_SITE_OTHER): Payer: Medicare PPO | Admitting: Licensed Clinical Social Worker

## 2018-10-19 ENCOUNTER — Other Ambulatory Visit: Payer: Self-pay

## 2018-10-19 DIAGNOSIS — F331 Major depressive disorder, recurrent, moderate: Secondary | ICD-10-CM

## 2018-10-19 DIAGNOSIS — F411 Generalized anxiety disorder: Secondary | ICD-10-CM

## 2018-10-19 DIAGNOSIS — F4310 Post-traumatic stress disorder, unspecified: Secondary | ICD-10-CM
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
## 2018-10-26 ENCOUNTER — Ambulatory Visit (INDEPENDENT_AMBULATORY_CARE_PROVIDER_SITE_OTHER): Payer: Medicare PPO | Admitting: Licensed Clinical Social Worker

## 2018-10-26 DIAGNOSIS — F4310 Post-traumatic stress disorder, unspecified: Secondary | ICD-10-CM | POA: Diagnosis not present

## 2018-10-26 DIAGNOSIS — F331 Major depressive disorder, recurrent, moderate: Secondary | ICD-10-CM

## 2018-10-26 DIAGNOSIS — F411 Generalized anxiety disorder: Secondary | ICD-10-CM | POA: Diagnosis not present

## 2018-10-26 NOTE — Progress Notes (Signed)
Virtual Visit via Video Note  I connected with Sandra Bean on 10/26/18 at  1:00 PM EDT by a video enabled telemedicine application and verified that I am speaking with the correct person using two identifiers.   I discussed the limitations of evaluation and management by telemedicine and the availability of in person appointments. The patient expressed understanding and agreed to proceed.  Follow Up Instructions:    I discussed the assessment and treatment plan with the patient. The patient was provided an opportunity to ask questions and all were answered. The patient agreed with the plan and demonstrated an understanding of the instructions.   The patient was advised to call back or seek an in-person evaluation if the symptoms worsen or if the condition fails to improve as anticipated.  I provided 52 minutes of non-face-to-face time during this encounter.   THERAPIST PROGRESS NOTE  Session Time: 1:02 PM to 1:54 PM  Participation Level: Active  Behavioral Response: CasualAlertneutral, appropriate  Type of Therapy: Individual Therapy  Treatment Goals addressed:   Processed through trauma to let go the past, challenge schemas from the past which are distorted and damaging the present, decrease trauma symptoms, coping Interventions: Solution Focused, Strength-based, Supportive and Other: coping, reframing  Summary: Sandra Bean is a 58 y.o. female who presents with update to events with niece that are one of her stressors. She had a virtual call with niece this morning virtually and a standing appointment each Wednesday.  Now given verbal consent to be involved with her care.  Reviews knees being oppositional and has tantrums, arguing cursing when she is not getting her way or wants to get things her way.  Has been that way since stroke.  She has not been participating in her life so reason she has become so difficult why her health issues have become severe.  Plan is for her to  participate in physical therapy and then come state with patient until she is again participating in her life.  I am happy that she did recognize me and verbal enough to give consent to be part of treatment.  Discussed how this is impacted patient emotionally and she shares I am a care giver by nature. It's ok and it is giving me something to do. I am her only living relative that she has. My responsibility and couldn't live with myself if I didn't help. I am down about it because of the situation, life changing events, I am ok with it with doing what I need to do to help. Sees a purpose and reason for this and moving with it. I want her to be better and participate in her own life. Most of the time I don't feel like purpose except taking care of my dog. I do nothing all day.  Therapist wanted to explore this with patient as an issue to address in therapy as it relates to mood symptoms.  I do nothing all day. I am actively looking for work.  Agreed with therapist of the amount of work that needs to go into looking for work can be a lot.  Viewed plans and she has been on disability since 2012 for depression and PTSD.  Has worked time since then, has worked in Scient[employer], research (medical) and describes the work environment in [employer], research (medical) where not being treated well by [employer].  She work from August to February to get a job in her own field and then furloughed from job on February 27.  I did get  out, went to church and did activities but always shut down now, worse being at home because there is no outlet.  church and activities. Shut down now, worse at home because no outlet.  I have a background but noted degree in accounting so she is not qualified for some jobs but has 35 years of experience.  I am dealing with age discrimination.  Part of me says what should I do. Am I wasting time finding a job in my career should I be looking for something else.  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   By 31 I want to be done, looking at what makes sense for long-term plans.   Reviewed session and patient relates it was nice to reminisce about my job reminds me of how I built my career,  My understanding of business and fiance, I learned it and still got it. More positive about her skills in looking for work and I have to wait for it.  Also discussed about knees and what is the right thing to do relates she is guided by reminding herself to try to be like Jesus, ask what would God do it. Gives me a lot more peace when I think about my life that way. Less mistakes.  Suicidal/Homicidal: No  Therapist Response: Therapist assessed patient current functioning per report and processed feelings related to stressor of niece with significant health issues and being involved with her care.  Identified patient's attitude of making choices based on what she thinks God would want her to do is very helpful in making choices.  Has been effective coping strategy for patient.  Therapist pointed this out as higher level of functioning and thinking in a spiritual level and making choices.  Identified patient's strength in session as being a caretaker, also standing up for herself.   Discussed patient not feeling purpose currently and connected that to not having a job, therapist validated patient on how she was feeling and how that can happen feeling a little drift without keeping busy with purposeful activity during the day.  Reviewed some of patient's work experiences to help highlight development of skills, through high-level jobs reminding her of as patient said how she got to where she is and the skills that are valuable that will be interesting to employers as well as recognition of patience that comes with looking for work Provided strength based and supportive interventions Link to treatment goal-coping, stress management Plan: Return again in 1  weeks.2.Continue to help patient processed feelings related to past trauma, to help her be exposed to lessen intensity of emotions, releasing of emotions, address and challenge unhelpful narratives from trauma, stress management  Diagnosis: Axis I:  major depressive disorder, recurrent, moderate, generalized anxiety disorder, PTSD, adjustment insomnia    Axis II: No diagnosis    [name], LCSW 10/26/2018

## 2018-11-02 ENCOUNTER — Ambulatory Visit (INDEPENDENT_AMBULATORY_CARE_PROVIDER_SITE_OTHER): Payer: Medicare PPO | Admitting: Licensed Clinical Social Worker

## 2018-11-02 ENCOUNTER — Other Ambulatory Visit: Payer: Self-pay

## 2018-11-02 DIAGNOSIS — F331 Major depressive disorder, recurrent, moderate: Secondary | ICD-10-CM | POA: Diagnosis not present

## 2018-11-02 DIAGNOSIS — F4310 Post-traumatic stress disorder, unspecified: Secondary | ICD-10-CM | POA: Diagnosis not present

## 2018-11-02 DIAGNOSIS — F411 Generalized anxiety disorder: Secondary | ICD-10-CM | POA: Diagnosis not present
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
## 2018-11-09 ENCOUNTER — Ambulatory Visit (INDEPENDENT_AMBULATORY_CARE_PROVIDER_SITE_OTHER): Payer: Medicare PPO | Admitting: Licensed Clinical Social Worker

## 2018-11-09 DIAGNOSIS — F411 Generalized anxiety disorder: Secondary | ICD-10-CM

## 2018-11-09 DIAGNOSIS — F4310 Post-traumatic stress disorder, unspecified: Secondary | ICD-10-CM

## 2018-11-09 DIAGNOSIS — F331 Major depressive disorder, recurrent, moderate: Secondary | ICD-10-CM | POA: Diagnosis not present

## 2018-11-09 DIAGNOSIS — F5102 Adjustment insomnia: Secondary | ICD-10-CM | POA: Diagnosis not present
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
## 2018-11-24 ENCOUNTER — Ambulatory Visit (HOSPITAL_COMMUNITY): Payer: Medicare PPO | Admitting: Licensed Clinical Social Worker

## 2018-12-02 ENCOUNTER — Ambulatory Visit (INDEPENDENT_AMBULATORY_CARE_PROVIDER_SITE_OTHER): Payer: Medicare PPO | Admitting: Licensed Clinical Social Worker

## 2018-12-02 DIAGNOSIS — F331 Major depressive disorder, recurrent, moderate: Secondary | ICD-10-CM

## 2018-12-02 DIAGNOSIS — F411 Generalized anxiety disorder: Secondary | ICD-10-CM | POA: Diagnosis not present

## 2018-12-02 DIAGNOSIS — F5102 Adjustment insomnia: Secondary | ICD-10-CM

## 2018-12-02 DIAGNOSIS — F4310 Post-traumatic stress disorder, unspecified: Secondary | ICD-10-CM

## 2018-12-02 NOTE — Progress Notes (Signed)
Virtual Visit via Video Note  I connected with Sandra Bean on 12/02/18 at 10:00 AM EDT by a video enabled telemedicine application and verified that I am speaking with the correct person using two identifiers.   I discussed the limitations of evaluation and management by telemedicine and the availability of in person appointments. The patient expressed understanding and agreed to proceed.   I discussed the assessment and treatment plan with the patient. The patient was provided an opportunity to ask questions and all were answered. The patient agreed with the plan and demonstrated an understanding of the instructions.   The patient was advised to call back or seek an in-person evaluation if the symptoms worsen or if the condition fails to improve as anticipated.  I provided 30 minutes of non-face-to-face time during this encounter.   THERAPIST PROGRESS NOTE  Session Time: 11:05 AM to 11:30 PM  Participation Level: Active  Behavioral Response: CasualAlertAnxious  Type of Therapy: Individual Therapy  Treatment Goals addressed:  processes through trauma to let go the past, challenge schemas from the past which are distorted and damaging the present, decrease trauma symptoms, coping Interventions: Solution Focused, Strength-based, Supportive and Other: coping  Summary: Sandra Bean is a 58 y.o. female who presents medical issues that are concerning and need to be taken care of.  Shares had nose surgery originally in both nostrils, after surgery had bleeding in right nostril, two further surgeries for that. Last surgery in 2015 and running into problems again. Hemoglobin is low with nose bleeds. Doctor cauterized inside of nose.  Mucus fills nostril but tries not to blow nose that would start the bleeding.  Uses an irrigator every morning to flush out mucus. Every time cough breaks the blood vessel.  Mucus gets and throat when hot leads to coughing, also when outside more in his course of  the day exposed to allergens so coughing more.  Will sometimes have to stand over the sink and let blood flow describing the severity.  Relates something has to be done at the point of thinking about another surgery, needs to cut sessions short to go to for emergency appointment with doctor. . Tuesday for orthopedic doctor and need to have surgery for foot can't tolerate the pain.lives alone and does not have family near so has to put stuff together to get ready surgery on foot. She won't be able to be mobile for 6 weeks.  Has to think about things such as how she will get her groceries, problem solve around dog walking, all the things she needs to figure out to manage when she won't be able to get around.  Help him solve briefly and patient related will put up signs and apartment in grocery store to advertise for dog walker, also as a backup close with maintenance worker at apartment and he will help out, therapist added he be able to help brainstorm about where she could find somebody to walk the dog.     Suicidal/Homicidal: No  Therapist Response: Therapist assessed patient current functioning per report and processed feelings related to significant stressors with medical issues.  Encourage patient with her own insight of necessity of getting medical treatment despite inconveniences as priority involves taking care of her health.  Recognize she has to go through difficulties from post surgery but overall impportance of taking care of medical issues is what is most important. Explored coping strategies as well as problem solving to deal with upcoming situation of taking care of  herself without being able to move around and not having near by family support.  Provided strength based and supportive interventions.   Plan: Return again in 3-4  weeks.2.Process trauma, work through narratives of loss, stress management, mood regulation  Diagnosis: Axis I:  major depressive disorder, recurrent, moderate,  generalized anxiety disorder, PTSD, adjustment insomnia    Axis II: No diagnosis    Sandra Register, LCSW 12/02/2018

## 2018-12-26 ENCOUNTER — Other Ambulatory Visit: Payer: Self-pay

## 2018-12-26 ENCOUNTER — Encounter: Payer: Self-pay | Admitting: Podiatry

## 2018-12-26 ENCOUNTER — Ambulatory Visit (INDEPENDENT_AMBULATORY_CARE_PROVIDER_SITE_OTHER): Payer: Medicare PPO

## 2018-12-26 ENCOUNTER — Ambulatory Visit: Payer: Medicare PPO | Admitting: Podiatry

## 2018-12-26 VITALS — BP 143/97 | HR 93

## 2018-12-26 DIAGNOSIS — M7751 Other enthesopathy of right foot: Secondary | ICD-10-CM | POA: Diagnosis not present

## 2018-12-26 DIAGNOSIS — M76821 Posterior tibial tendinitis, right leg: Secondary | ICD-10-CM

## 2018-12-26 DIAGNOSIS — B351 Tinea unguium: Secondary | ICD-10-CM

## 2018-12-26 DIAGNOSIS — M79675 Pain in left toe(s): Secondary | ICD-10-CM | POA: Diagnosis not present

## 2018-12-26 DIAGNOSIS — Z79899 Other long term (current) drug therapy: Secondary | ICD-10-CM

## 2018-12-26 DIAGNOSIS — M25571 Pain in right ankle and joints of right foot: Secondary | ICD-10-CM

## 2018-12-26 DIAGNOSIS — M25471 Effusion, right ankle: Secondary | ICD-10-CM

## 2018-12-26 DIAGNOSIS — M779 Enthesopathy, unspecified: Secondary | ICD-10-CM

## 2018-12-26 NOTE — Patient Instructions (Signed)
Pre-Operative Instructions  Congratulations, you have decided to take an important step towards improving your quality of life.  You can be assured that the doctors and staff at [facility] will be with you every step of the way.  Here are some important things you should know:  1. Plan to be at the surgery center/hospital at least 1 (one) hour prior to your scheduled time, unless otherwise directed by the surgical center/hospital staff.  You must have a responsible adult accompany you, remain during the surgery and drive you home.  Make sure you have directions to the surgical center/hospital to ensure you arrive on time. 2. If you are having surgery at [facility] or [facility] hospitals, you will need a copy of your medical history and physical form from your family physician within one month prior to the date of surgery. We will give you a form for your primary physician to complete.  3. We make every effort to accommodate the date you request for surgery.  However, there are times where surgery dates or times have to be moved.  We will contact you as soon as possible if a change in schedule is required.   4. No aspirin/ibuprofen for one week before surgery.  If you are on aspirin, any non-steroidal anti-inflammatory medications (Mobic, Aleve, Ibuprofen) should not be taken seven (7) days prior to your surgery.  You make take Tylenol for pain prior to surgery.  5. Medications - If you are taking daily heart and blood pressure medications, seizure, reflux, allergy, asthma, anxiety, pain or diabetes medications, make sure you notify the surgery center/hospital before the day of surgery so they can tell you which medications you should take or avoid the day of surgery. 6. No food or drink after midnight the night before surgery unless directed otherwise by surgical center/hospital staff. 7. No alcoholic beverages 24-hours prior to surgery.  No smoking 24-hours prior or 24-hours after  surgery. 8. Wear loose pants or shorts. They should be loose enough to fit over bandages, boots, and casts. 9. Don't wear slip-on shoes. Sneakers are preferred. 10. Bring your boot with you to the surgery center/hospital.  Also bring crutches or a walker if your physician has prescribed it for you.  If you do not have this equipment, it will be provided for you after surgery. 11. If you have not been contacted by the surgery center/hospital by the day before your surgery, call to confirm the date and time of your surgery. 12. Leave-time from work may vary depending on the type of surgery you have.  Appropriate arrangements should be made prior to surgery with your employer. 13. Prescriptions will be provided immediately following surgery by your doctor.  Fill these as soon as possible after surgery and take the medication as directed. Pain medications will not be refilled on weekends and must be approved by the doctor. 14. Remove nail polish on the operative foot and avoid getting pedicures prior to surgery. 15. Wash the night before surgery.  The night before surgery wash the foot and leg well with water and the antibacterial soap provided. Be sure to pay special attention to beneath the toenails and in between the toes.  Wash for at least three (3) minutes. Rinse thoroughly with water and dry well with a towel.  Perform this wash unless told not to do so by your physician.  Enclosed: 1 Ice pack (please put in freezer the night before surgery)   1 Hibiclens skin cleaner     Pre-op instructions  If you have any questions regarding the instructions, please do not hesitate to call our office.  [place]: [address], [place], [place] [address] -- [phone]  [place]: [address] [place], [place] [address] -- [phone]  [place]: [address]  [place], [place] [address] -- [phone]   Website: [email or web address] 

## 2018-12-26 NOTE — Progress Notes (Addendum)
Subjective:  Patient ID: Sandra Bean, female    DOB: 05-11-1960,  MRN: [id number]  Chief Complaint  Patient presents with  . Foot Pain    Rt ankle pain: medial ankle pain and swelling x 1 year, tx: injections, boot and rest. " I have been to [facility] for this and got an MRI , my ankle hurts so bad I cant even walk anymore"     58 y.o. female presents with the above complaint. Hx confirmed with patient.  Patient has been extensively treated for this right posterior tibial tendon dysfunction.  She states that she has been treated with injections boot rest ice elevate although no that.  She has failed all conservative therapy and wishes surgical intervention at this time.  She was treated at [facility] but has been unable to schedule surgery with them and is presenting today for second opinion.  She has brought her MRI disc with me which will stay with me until after the surgery.  She denies any nausea fever chills vomiting shortness of breath.   Review of Systems: Negative except as noted in the HPI. Denies N/V/F/Ch.  Past Medical History:  Diagnosis Date  . Anemia   . Anxiety    PTDS  . Arthritis    RIGHT SIDE  . COPD (chronic obstructive pulmonary disease) ([place])   . Depression   . Diabetes mellitus without complication (HCC)    Type 2  . Fibroids   . Fibromyalgia    DAILY PAIN  . GERD (gastroesophageal reflux disease)   . H/O hernia repair 2009  . Headache   . Hypertension   . Hyperthyroidism    NON PER PATIENT  . Mental disorder   . Mitral valve prolapse   . Ovarian cyst     Current Outpatient Medications:  .  Accu-Chek FastClix Lancets MISC, Check blood sugar once daily., Disp: , Rfl:  .  glucose blood (ACCU-CHEK GUIDE) test strip, Check blood sugar once daily., Disp: , Rfl:  .  nicotine (NICODERM CQ - DOSED IN MG/24 HOURS) 14 mg/24hr patch, Place onto the skin., Disp: , Rfl:  .  amLODipine (NORVASC) 5 MG tablet, Take 5 mg by mouth daily. , Disp: , Rfl: 0 .  aspirin EC 325 MG  tablet, Take 975 mg by mouth daily as needed for mild pain (headaches)., Disp: , Rfl:  .  atorvastatin (LIPITOR) 20 MG tablet, Take 20 mg by mouth daily. , Disp: , Rfl:  .  atorvastatin (LIPITOR) 20 MG tablet, Take 20 mg by mouth daily., Disp: , Rfl:  .  BLACK CURRANT SEED OIL PO, Take by mouth., Disp: , Rfl:  .  desvenlafaxine (PRISTIQ) 100 MG 24 hr tablet, Take 1 tablet (100 mg total) by mouth daily., Disp: 90 tablet, Rfl: 0 .  diclofenac (VOLTAREN) 75 MG EC tablet, Take 75 mg by mouth 2 (two) times daily., Disp: , Rfl:  .  ECHINACEA EXTRACT PO, Take by mouth., Disp: , Rfl:  .  ELDERBERRY PO, Take by mouth., Disp: , Rfl:  .  Fe Fum-FePoly-FA-Vit C-Vit B3 (INTEGRA F) 125-1 MG CAPS, Take 1 capsule by mouth daily. (Patient not taking: Reported on 01/26/2018), Disp: 30 capsule, Rfl: 3 .  ferrous sulfate 325 (65 FE) MG tablet, Take 1 tablet (325 mg total) by mouth daily for 7 days., Disp: 7 tablet, Rfl: 0 .  Flaxseed, Linseed, (FLAX SEED OIL PO), Take by mouth., Disp: , Rfl:  .  fluticasone (FLONASE) 50 MCG/ACT nasal spray, 1 spray  by Both Nostrils route daily., Disp: , Rfl:  .  ibuprofen (ADVIL,MOTRIN) 600 MG tablet, Take 1 tablet (600 mg total) by mouth every 6 (six) hours as needed (mild pain). (Patient not taking: Reported on 04/06/2017), Disp: 30 tablet, Rfl: 0 .  JARDIANCE 10 MG TABS tablet, Take 10 mg by mouth daily. , Disp: , Rfl:  .  Lancets (ONETOUCH ULTRASOFT) lancets, , Disp: , Rfl:  .  levofloxacin (LEVAQUIN) 500 MG tablet, TK 1 T PO D FOR 10 DAYS, Disp: , Rfl:  .  losartan-hydrochlorothiazide (HYZAAR) 100-25 MG tablet, Take 1 tablet by mouth once daily, Disp: , Rfl: 0 .  meloxicam (MOBIC) 15 MG tablet, , Disp: , Rfl:  .  metFORMIN (GLUCOPHAGE) 500 MG tablet, Take 1 tablet (500 mg total) by mouth 2 (two) times daily with a meal. (Patient taking differently: Take 1,000 mg by mouth 2 (two) times daily with a meal. ), Disp: 60 tablet, Rfl: 3 .  methocarbamol (ROBAXIN) 500 MG tablet, Take 1  tablet (500 mg total) by mouth every 8 (eight) hours as needed. (Patient not taking: Reported on 10/07/2018), Disp: 15 tablet, Rfl: 0 .  metoprolol tartrate (LOPRESSOR) 50 MG tablet, Take 50 mg by mouth daily. , Disp: , Rfl: 0 .  Multiple Vitamin (MULTIVITAMIN IRON-FREE) TABS, Take 1 tablet by mouth daily., Disp: , Rfl:  .  Multiple Vitamins-Minerals (HAIR SKIN AND NAILS FORMULA) TABS, Take 1 tablet by mouth daily. , Disp: , Rfl:  .  naproxen (NAPROSYN) 500 MG tablet, Take 1 tablet (500 mg total) by mouth 2 (two) times daily. (Patient not taking: Reported on 10/07/2018), Disp: 10 tablet, Rfl: 0 .  Omega-3 Fatty Acids (FISH OIL) 1000 MG CAPS, Take by mouth., Disp: , Rfl:  .  omeprazole (PRILOSEC) 20 MG capsule, Take 20 mg by mouth daily. , Disp: , Rfl:  .  potassium chloride (K-DUR) 10 MEQ tablet, Take 1 tablet (10 mEq total) by mouth daily for 4 days., Disp: 4 tablet, Rfl: 0 .  potassium chloride (MICRO-K) 10 MEQ CR capsule, Take 10 mEq by mouth 2 (two) times daily., Disp: , Rfl:  .  pramipexole (MIRAPEX) 1.5 MG tablet, Take 1 tablet (1.5 mg total) by mouth at bedtime., Disp: 90 tablet, Rfl: 0 .  predniSONE (STERAPRED UNI-PAK 21 TAB) 10 MG (21) TBPK tablet, TK UTD, Disp: , Rfl:  .  sodium chloride (OCEAN) 0.65 % SOLN nasal spray, Place 1 spray into both nostrils daily as needed for congestion. , Disp: , Rfl:  .  traZODone (DESYREL) 100 MG tablet, Take 200-300 mg by mouth at bedtime as needed for sleep. , Disp: , Rfl:  .  vitamin C (ASCORBIC ACID) 500 MG tablet, Take 500 mg by mouth daily., Disp: , Rfl:   Social History   Tobacco Use  Smoking Status Current Every Day Smoker  . Packs/day: 0.50  . Types: Cigarettes  Smokeless Tobacco Never Used    Allergies  Allergen Reactions  . Famotidine Nausea Only  . Mirtazapine Other (See Comments)    Overly drowsy, couldn't function    Objective:   Vitals:   12/26/18 1338  BP: (!) 143/97  Pulse: 93   There is no height or weight on file to  calculate BMI. Constitutional no acute distress, alert/oriented x3 and appropriate mood and affect  Vascular Dorsalis pedis pulse: present Posterior tibial pulse: present and  Hair pattern: Present Warmth: no warmth  Neurologic Epicritic sensations intact  Dermatologic Skin normal Thickened discolored mycotic nails  noted on the left hallux and second digit.  Orthopedic: Swelling: none and moderate along the course of Posterior tibial tendon Tenderness: diffuse along the PT tendon Deformity: Flat foot deformity bilateral.  Examination of the feet reveals warm, good capillary refill. Pain with palpation to the tendon. Pain with double heel raise. Unable to perform single heel raise.  Resupination noted with double heel raise to bilateral heels.  Too many toe signs bilaterally noted.    Radiographs: Bilateral AP and lateral view of skeletally mature adult.  There is a decrease in calcaneal inclination angle, increase in talar declination, increasing near his ankle noted.  No significant deformity noted no joint arthritic changes noted.  Assessment:   1. Tendonitis   2. Encounter for long-term (current) use of medications   3. Posterior tibial tendonitis, right   4. Pain and swelling of right ankle   5. Pain due to onychomycosis of toenail of left foot    Plan:  Patient was evaluated and treated and all questions answered.  Posterior tibial tendinitis right foot -MRI from [facility] was reviewed with the patient 1. Severe posterior tibial tendinosis and tenosynovitis with possible low-grade partial tear of the distal tendon of the hindfoot. 2. Thickening and apparent previous sprain or partial tear of the flexor retinaculum medially over the posterior tibial tendon with surrounding edema. 3. Moderate tibiotalar joint effusion with edema and fluid of the tarsal sinus. This is nonspecific and may be seen in degenerative change or previous subtalar injury and/or instability. 4. Old  injury to the anterior and posterior talofibular ligaments. 5. Mild chronic plantar fasciitis.  -Given that she has exhausted all conservative therapy intervention it was determined that patient would benefit from a right foot posterior tibial tendon debridement with repair and possible [facility] possible FDL transfer. -Patient will be placed in a cam boot which she already has from her previous visit at [place]. -She will be nonweightbearing to the right lower extremity with a knee scooter.for 6 months duration. Dx is Posterior tibial tendon dysfunction.   Onychomycosis of the left hallux and second digit -She wishes to have  Lamisil therapy started to remove the onychomycosis.  I explained to the patient that therapy consisted of major work-up for the liver and therefore partially must obtain her liver function studies and and only if they are normal we will start Lamisil therapy.   Informed surgical risk consent was reviewed and read aloud to the patient.  I reviewed the films.  I have discussed my findings with the patient in great detail.  I have discussed all risks including but not limited to infection, stiffness, scarring, limp, disability, deformity, damage to blood vessels and nerves, numbness, poor healing, need for braces, arthritis, chronic pain, amputation, death.  All benefits and realistic expectations discussed in great detail.  I have made no promises as to the outcome.  I have provided realistic expectations.  I have offered the patient a 2nd opinion, which they have declined and assured me they preferred to proceed despite the risks   No follow-ups on file.

## 2018-12-27 ENCOUNTER — Telehealth: Payer: Self-pay | Admitting: *Deleted

## 2018-12-27 NOTE — Telephone Encounter (Signed)
"  I received you text about my surgery.  I'm scheduled for this Thursday at 4:30 pm."  You're not scheduled for Thursday.  Your surgery is scheduled for Monday, October 12.  "That's what I was told when I was there by the doctor.  Anyway, just tell me when.  I have made all these arrangements."  Your surgery is scheduled at [facility] on Monday at 12:30 pm.  You'll probably have to be there about a hour before that time.  The text you received was from the surgical center.  You'll probably get a call from someone from the surgical center on Friday and that person will give you your exact arrival time.  "I knew the text was from them.  Let me know if there's anything else I need to do between now and then."  I will.

## 2019-01-02 DIAGNOSIS — S86111A Strain of other muscle(s) and tendon(s) of posterior muscle group at lower leg level, right leg, initial encounter: Secondary | ICD-10-CM

## 2019-01-03 ENCOUNTER — Telehealth: Payer: Self-pay | Admitting: Podiatry

## 2019-01-03 DIAGNOSIS — Z9889 Other specified postprocedural states: Secondary | ICD-10-CM

## 2019-01-03 DIAGNOSIS — M779 Enthesopathy, unspecified: Secondary | ICD-10-CM

## 2019-01-03 NOTE — Telephone Encounter (Signed)
Pt had surgery yesterday and is needing an order/prescription for a scooter sent to [facility].  Fax# [phone]

## 2019-01-04 NOTE — Addendum Note (Signed)
Addended by: Harriett Sine D on: 01/04/2019 01:02 PM   Modules accepted: Orders

## 2019-01-04 NOTE — Telephone Encounter (Deleted)
Faxed and emailed orders for knee scooter to [facility].

## 2019-01-04 NOTE — Telephone Encounter (Signed)
Faxed orders to [place] and emailed to M. Stenson, A. Catron.

## 2019-01-04 NOTE — Telephone Encounter (Signed)
email received with new [facility] fax and faxed to new number.

## 2019-01-04 NOTE — Telephone Encounter (Signed)
I made the change to the note. Let me know if you need anything else.

## 2019-01-10 ENCOUNTER — Other Ambulatory Visit: Payer: Self-pay

## 2019-01-10 ENCOUNTER — Ambulatory Visit (INDEPENDENT_AMBULATORY_CARE_PROVIDER_SITE_OTHER): Payer: Medicare PPO | Admitting: Podiatry

## 2019-01-10 ENCOUNTER — Encounter: Payer: Self-pay | Admitting: Podiatry

## 2019-01-10 DIAGNOSIS — M779 Enthesopathy, unspecified: Secondary | ICD-10-CM

## 2019-01-10 DIAGNOSIS — Z9889 Other specified postprocedural states: Secondary | ICD-10-CM

## 2019-01-10 NOTE — Progress Notes (Signed)
Subjective:  Patient ID: Sandra Bean, female    DOB: 05/22/1960,  MRN: [id number]  Chief Complaint  Patient presents with  . Routine Post Op    DOS 10.12.2020 Repair post tibial tendon Rt, [facility]ener Advanced Tendon Rt. Pt states healing well, only concern was wrap was too tight after surgery.     DOS: 01/02/19 Procedure: Right foot repair of the posterior tibial tendon.  58 y.o. female returns for post-op check.  Patient is doing really well.  She is ambulating today with her knee scooter nonweightbearing to the right lower extremity.  That she has not had to take any pain medication.  Denies any nausea fever chills vomiting.  He has no other acute complaints.  Review of Systems: Negative except as noted in the HPI. Denies N/V/F/Ch.  Past Medical History:  Diagnosis Date  . Anemia   . Anxiety    PTDS  . Arthritis    RIGHT SIDE  . COPD (chronic obstructive pulmonary disease) ([place])   . Depression   . Diabetes mellitus without complication (HCC)    Type 2  . Fibroids   . Fibromyalgia    DAILY PAIN  . GERD (gastroesophageal reflux disease)   . H/O hernia repair 2009  . Headache   . Hypertension   . Hyperthyroidism    NON PER PATIENT  . Mental disorder   . Mitral valve prolapse   . Ovarian cyst     Current Outpatient Medications:  .  Accu-Chek FastClix Lancets MISC, Check blood sugar once daily., Disp: , Rfl:  .  amLODipine (NORVASC) 5 MG tablet, Take 5 mg by mouth daily. , Disp: , Rfl: 0 .  aspirin EC 325 MG tablet, Take 975 mg by mouth daily as needed for mild pain (headaches)., Disp: , Rfl:  .  atorvastatin (LIPITOR) 20 MG tablet, Take 20 mg by mouth daily., Disp: , Rfl:  .  BLACK CURRANT SEED OIL PO, Take by mouth., Disp: , Rfl:  .  desvenlafaxine (PRISTIQ) 100 MG 24 hr tablet, Take 1 tablet (100 mg total) by mouth daily., Disp: 90 tablet, Rfl: 0 .  diclofenac (VOLTAREN) 75 MG EC tablet, Take 75 mg by mouth 2 (two) times daily., Disp: , Rfl:  .  ECHINACEA EXTRACT PO,  Take by mouth., Disp: , Rfl:  .  ELDERBERRY PO, Take by mouth., Disp: , Rfl:  .  Fe Fum-FePoly-FA-Vit C-Vit B3 (INTEGRA F) 125-1 MG CAPS, Take 1 capsule by mouth daily., Disp: 30 capsule, Rfl: 3 .  Flaxseed, Linseed, (FLAX SEED OIL PO), Take by mouth., Disp: , Rfl:  .  fluticasone (FLONASE) 50 MCG/ACT nasal spray, 1 spray by Both Nostrils route daily., Disp: , Rfl:  .  glucose blood (ACCU-CHEK GUIDE) test strip, Check blood sugar once daily., Disp: , Rfl:  .  HYDROcodone-acetaminophen (NORCO/VICODIN) 5-325 MG tablet, TK 1 T PO  Q 6 H PRN, Disp: , Rfl:  .  ibuprofen (ADVIL,MOTRIN) 600 MG tablet, Take 1 tablet (600 mg total) by mouth every 6 (six) hours as needed (mild pain)., Disp: 30 tablet, Rfl: 0 .  JARDIANCE 10 MG TABS tablet, Take 10 mg by mouth daily. , Disp: , Rfl:  .  Lancets (ONETOUCH ULTRASOFT) lancets, , Disp: , Rfl:  .  levofloxacin (LEVAQUIN) 500 MG tablet, TK 1 T PO D FOR 10 DAYS, Disp: , Rfl:  .  losartan-hydrochlorothiazide (HYZAAR) 100-25 MG tablet, Take 1 tablet by mouth once daily, Disp: , Rfl: 0 .  meloxicam (MOBIC) 15  MG tablet, , Disp: , Rfl:  .  metFORMIN (GLUCOPHAGE) 500 MG tablet, Take 1 tablet (500 mg total) by mouth 2 (two) times daily with a meal. (Patient taking differently: Take 1,000 mg by mouth 2 (two) times daily with a meal. ), Disp: 60 tablet, Rfl: 3 .  methocarbamol (ROBAXIN) 500 MG tablet, Take 1 tablet (500 mg total) by mouth every 8 (eight) hours as needed., Disp: 15 tablet, Rfl: 0 .  metoprolol tartrate (LOPRESSOR) 50 MG tablet, Take 50 mg by mouth daily. , Disp: , Rfl: 0 .  Multiple Vitamin (MULTIVITAMIN IRON-FREE) TABS, Take 1 tablet by mouth daily., Disp: , Rfl:  .  Multiple Vitamins-Minerals (HAIR SKIN AND NAILS FORMULA) TABS, Take 1 tablet by mouth daily. , Disp: , Rfl:  .  naproxen (NAPROSYN) 500 MG tablet, Take 1 tablet (500 mg total) by mouth 2 (two) times daily., Disp: 10 tablet, Rfl: 0 .  nicotine (NICODERM CQ - DOSED IN MG/24 HOURS) 14 mg/24hr  patch, Place onto the skin., Disp: , Rfl:  .  Omega-3 Fatty Acids (FISH OIL) 1000 MG CAPS, Take by mouth., Disp: , Rfl:  .  omeprazole (PRILOSEC) 20 MG capsule, Take 20 mg by mouth daily. , Disp: , Rfl:  .  potassium chloride (MICRO-K) 10 MEQ CR capsule, Take 10 mEq by mouth 2 (two) times daily., Disp: , Rfl:  .  pramipexole (MIRAPEX) 1.5 MG tablet, Take 1 tablet (1.5 mg total) by mouth at bedtime., Disp: 90 tablet, Rfl: 0 .  predniSONE (STERAPRED UNI-PAK 21 TAB) 10 MG (21) TBPK tablet, TK UTD, Disp: , Rfl:  .  sodium chloride (OCEAN) 0.65 % SOLN nasal spray, Place 1 spray into both nostrils daily as needed for congestion. , Disp: , Rfl:  .  traZODone (DESYREL) 100 MG tablet, Take 200-300 mg by mouth at bedtime as needed for sleep. , Disp: , Rfl:  .  vitamin C (ASCORBIC ACID) 500 MG tablet, Take 500 mg by mouth daily., Disp: , Rfl:  .  atorvastatin (LIPITOR) 20 MG tablet, Take 20 mg by mouth daily. , Disp: , Rfl:  .  ferrous sulfate 325 (65 FE) MG tablet, Take 1 tablet (325 mg total) by mouth daily for 7 days., Disp: 7 tablet, Rfl: 0 .  potassium chloride (K-DUR) 10 MEQ tablet, Take 1 tablet (10 mEq total) by mouth daily for 4 days., Disp: 4 tablet, Rfl: 0  Social History   Tobacco Use  Smoking Status Current Every Day Smoker  . Packs/day: 0.50  . Types: Cigarettes  Smokeless Tobacco Never Used    Allergies  Allergen Reactions  . Famotidine Nausea Only  . Mirtazapine Other (See Comments)    Overly drowsy, couldn't function    Objective:  There were no vitals filed for this visit. There is no height or weight on file to calculate BMI. Constitutional Well developed. Well nourished.  Vascular Foot warm and well perfused. Capillary refill normal to all digits.   Neurologic Normal speech. Oriented to person, place, and time. Epicritic sensation to light touch grossly present bilaterally.  Dermatologic Skin healing well without signs of infection. Skin edges well coapted without signs  of infection.  Orthopedic: Tenderness to palpation noted about the surgical site.   Radiographs: None Assessment:  No diagnosis found. Plan:  Patient was evaluated and treated and all questions answered.  S/p foot surgery right -Progressing as expected post-operatively. -XR: None -WB Status: Nonweightbearing in knee scooter -Sutures: There are intact clean dry.  No signs  of dehiscence well coapted. -Medications: None -Foot redressed.  No follow-ups on file.

## 2019-01-18 ENCOUNTER — Encounter: Payer: Self-pay | Admitting: Podiatry

## 2019-01-18 ENCOUNTER — Ambulatory Visit (INDEPENDENT_AMBULATORY_CARE_PROVIDER_SITE_OTHER): Payer: Medicare PPO | Admitting: Podiatry

## 2019-01-18 ENCOUNTER — Ambulatory Visit (HOSPITAL_COMMUNITY): Payer: Medicare PPO | Admitting: Psychiatry

## 2019-01-18 ENCOUNTER — Ambulatory Visit: Payer: Medicare PPO | Admitting: Orthotics

## 2019-01-18 ENCOUNTER — Other Ambulatory Visit: Payer: Self-pay

## 2019-01-18 DIAGNOSIS — Z9889 Other specified postprocedural states: Secondary | ICD-10-CM

## 2019-01-18 DIAGNOSIS — M76821 Posterior tibial tendinitis, right leg: Secondary | ICD-10-CM

## 2019-01-18 NOTE — Progress Notes (Signed)
Subjective:  Patient ID: Sandra Bean, female    DOB: 09-14-1960,  MRN: [id number]  Chief Complaint  Patient presents with  . Routine Post Op    i am doing better and there is not much swelling at all    DOS: 01/02/19 Procedure: Right posterior tibial tendon repair  58 y.o. female returns for post-op check.  Patient states that she is doing well.  She has been nonweightbearing to the right lower extremity.  Her skin incision looks good.  She denies getting wet.  Review of Systems: Negative except as noted in the HPI. Denies N/V/F/Ch.  Past Medical History:  Diagnosis Date  . Anemia   . Anxiety    PTDS  . Arthritis    RIGHT SIDE  . COPD (chronic obstructive pulmonary disease) ([place])   . Depression   . Diabetes mellitus without complication (HCC)    Type 2  . Fibroids   . Fibromyalgia    DAILY PAIN  . GERD (gastroesophageal reflux disease)   . H/O hernia repair 2009  . Headache   . Hypertension   . Hyperthyroidism    NON PER PATIENT  . Mental disorder   . Mitral valve prolapse   . Ovarian cyst     Current Outpatient Medications:  .  Accu-Chek FastClix Lancets MISC, Check blood sugar once daily., Disp: , Rfl:  .  amLODipine (NORVASC) 5 MG tablet, Take 5 mg by mouth daily. , Disp: , Rfl: 0 .  aspirin EC 325 MG tablet, Take 975 mg by mouth daily as needed for mild pain (headaches)., Disp: , Rfl:  .  atorvastatin (LIPITOR) 20 MG tablet, Take 20 mg by mouth daily. , Disp: , Rfl:  .  atorvastatin (LIPITOR) 20 MG tablet, Take 20 mg by mouth daily., Disp: , Rfl:  .  BLACK CURRANT SEED OIL PO, Take by mouth., Disp: , Rfl:  .  desvenlafaxine (PRISTIQ) 100 MG 24 hr tablet, Take 1 tablet (100 mg total) by mouth daily., Disp: 90 tablet, Rfl: 0 .  diclofenac (VOLTAREN) 75 MG EC tablet, Take 75 mg by mouth 2 (two) times daily., Disp: , Rfl:  .  ECHINACEA EXTRACT PO, Take by mouth., Disp: , Rfl:  .  ELDERBERRY PO, Take by mouth., Disp: , Rfl:  .  Fe Fum-FePoly-FA-Vit C-Vit B3  (INTEGRA F) 125-1 MG CAPS, Take 1 capsule by mouth daily., Disp: 30 capsule, Rfl: 3 .  ferrous sulfate 325 (65 FE) MG tablet, Take 1 tablet (325 mg total) by mouth daily for 7 days., Disp: 7 tablet, Rfl: 0 .  Flaxseed, Linseed, (FLAX SEED OIL PO), Take by mouth., Disp: , Rfl:  .  fluticasone (FLONASE) 50 MCG/ACT nasal spray, 1 spray by Both Nostrils route daily., Disp: , Rfl:  .  glucose blood (ACCU-CHEK GUIDE) test strip, Check blood sugar once daily., Disp: , Rfl:  .  HYDROcodone-acetaminophen (NORCO/VICODIN) 5-325 MG tablet, TK 1 T PO  Q 6 H PRN, Disp: , Rfl:  .  ibuprofen (ADVIL,MOTRIN) 600 MG tablet, Take 1 tablet (600 mg total) by mouth every 6 (six) hours as needed (mild pain)., Disp: 30 tablet, Rfl: 0 .  JARDIANCE 10 MG TABS tablet, Take 10 mg by mouth daily. , Disp: , Rfl:  .  Lancets (ONETOUCH ULTRASOFT) lancets, , Disp: , Rfl:  .  levofloxacin (LEVAQUIN) 500 MG tablet, TK 1 T PO D FOR 10 DAYS, Disp: , Rfl:  .  losartan-hydrochlorothiazide (HYZAAR) 100-25 MG tablet, Take 1 tablet by mouth  once daily, Disp: , Rfl: 0 .  meloxicam (MOBIC) 15 MG tablet, , Disp: , Rfl:  .  metFORMIN (GLUCOPHAGE) 500 MG tablet, Take 1 tablet (500 mg total) by mouth 2 (two) times daily with a meal. (Patient taking differently: Take 1,000 mg by mouth 2 (two) times daily with a meal. ), Disp: 60 tablet, Rfl: 3 .  methocarbamol (ROBAXIN) 500 MG tablet, Take 1 tablet (500 mg total) by mouth every 8 (eight) hours as needed., Disp: 15 tablet, Rfl: 0 .  metoprolol tartrate (LOPRESSOR) 50 MG tablet, Take 50 mg by mouth daily. , Disp: , Rfl: 0 .  Multiple Vitamin (MULTIVITAMIN IRON-FREE) TABS, Take 1 tablet by mouth daily., Disp: , Rfl:  .  Multiple Vitamins-Minerals (HAIR SKIN AND NAILS FORMULA) TABS, Take 1 tablet by mouth daily. , Disp: , Rfl:  .  naproxen (NAPROSYN) 500 MG tablet, Take 1 tablet (500 mg total) by mouth 2 (two) times daily., Disp: 10 tablet, Rfl: 0 .  nicotine (NICODERM CQ - DOSED IN MG/24 HOURS) 14  mg/24hr patch, Place onto the skin., Disp: , Rfl:  .  Omega-3 Fatty Acids (FISH OIL) 1000 MG CAPS, Take by mouth., Disp: , Rfl:  .  omeprazole (PRILOSEC) 20 MG capsule, Take 20 mg by mouth daily. , Disp: , Rfl:  .  potassium chloride (K-DUR) 10 MEQ tablet, Take 1 tablet (10 mEq total) by mouth daily for 4 days., Disp: 4 tablet, Rfl: 0 .  potassium chloride (MICRO-K) 10 MEQ CR capsule, Take 10 mEq by mouth 2 (two) times daily., Disp: , Rfl:  .  pramipexole (MIRAPEX) 1.5 MG tablet, Take 1 tablet (1.5 mg total) by mouth at bedtime., Disp: 90 tablet, Rfl: 0 .  predniSONE (STERAPRED UNI-PAK 21 TAB) 10 MG (21) TBPK tablet, TK UTD, Disp: , Rfl:  .  sodium chloride (OCEAN) 0.65 % SOLN nasal spray, Place 1 spray into both nostrils daily as needed for congestion. , Disp: , Rfl:  .  traZODone (DESYREL) 100 MG tablet, Take 200-300 mg by mouth at bedtime as needed for sleep. , Disp: , Rfl:  .  vitamin C (ASCORBIC ACID) 500 MG tablet, Take 500 mg by mouth daily., Disp: , Rfl:   Social History   Tobacco Use  Smoking Status Current Every Day Smoker  . Packs/day: 0.50  . Types: Cigarettes  Smokeless Tobacco Never Used    Allergies  Allergen Reactions  . Famotidine Nausea Only  . Mirtazapine Other (See Comments)    Overly drowsy, couldn't function    Objective:  There were no vitals filed for this visit. There is no height or weight on file to calculate BMI. Constitutional Well developed. Well nourished.  Vascular Foot warm and well perfused. Capillary refill normal to all digits.   Neurologic Normal speech. Oriented to person, place, and time. Epicritic sensation to light touch grossly present bilaterally.  Dermatologic Skin healing well without signs of infection. Skin edges well coapted without signs of infection.  Orthopedic: Tenderness to palpation noted about the surgical site.   Radiographs: None Assessment:  No diagnosis found. Plan:  Patient was evaluated and treated and all  questions answered.  S/p foot surgery right -Progressing as expected post-operatively. -XR: None -WB Status: Nonweightbearing in a knee scooter and cam boot -Sutures: Intact no signs of dehiscence.  I will plan on taking the stitches in 2 weeks. -Medications: None -Foot redressed. -Patient is also getting casted for orthotics by Liliane Channel today  No follow-ups on file.

## 2019-01-18 NOTE — Progress Notes (Signed)
Patient cast for f/o to help with arch support, hx of pes planus.  Recovering from PTT surgery  Dr. Posey Pronto to drop charges.

## 2019-01-19 ENCOUNTER — Encounter (HOSPITAL_COMMUNITY): Payer: Self-pay | Admitting: Psychiatry

## 2019-01-19 ENCOUNTER — Ambulatory Visit (HOSPITAL_COMMUNITY): Payer: Medicare PPO | Admitting: Psychiatry

## 2019-01-19 ENCOUNTER — Ambulatory Visit (INDEPENDENT_AMBULATORY_CARE_PROVIDER_SITE_OTHER): Payer: Medicare PPO | Admitting: Psychiatry

## 2019-01-19 DIAGNOSIS — F411 Generalized anxiety disorder: Secondary | ICD-10-CM | POA: Diagnosis not present

## 2019-01-19 DIAGNOSIS — F4310 Post-traumatic stress disorder, unspecified: Secondary | ICD-10-CM | POA: Diagnosis not present

## 2019-01-19 DIAGNOSIS — F5102 Adjustment insomnia: Secondary | ICD-10-CM

## 2019-01-19 DIAGNOSIS — F331 Major depressive disorder, recurrent, moderate: Secondary | ICD-10-CM

## 2019-01-19 MED ORDER — PRAMIPEXOLE DIHYDROCHLORIDE 1.5 MG PO TABS: 1.5000 mg | ORAL_TABLET | Freq: Every day | ORAL | 0 refills | Status: DC

## 2019-01-19 MED ORDER — TRAZODONE HCL 100 MG PO TABS: 200.0000 mg | ORAL_TABLET | Freq: Every evening | ORAL | 2 refills | Status: DC | PRN

## 2019-01-19 MED ORDER — DESVENLAFAXINE SUCCINATE ER 100 MG PO TB24: 100.0000 mg | ORAL_TABLET | Freq: Every day | ORAL | 0 refills | Status: DC
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
## 2019-02-01 ENCOUNTER — Other Ambulatory Visit: Payer: Self-pay

## 2019-02-01 ENCOUNTER — Encounter: Payer: Self-pay | Admitting: Podiatry

## 2019-02-01 ENCOUNTER — Ambulatory Visit (INDEPENDENT_AMBULATORY_CARE_PROVIDER_SITE_OTHER): Payer: Medicare PPO | Admitting: Podiatry

## 2019-02-01 DIAGNOSIS — B351 Tinea unguium: Secondary | ICD-10-CM

## 2019-02-01 DIAGNOSIS — M779 Enthesopathy, unspecified: Secondary | ICD-10-CM

## 2019-02-01 DIAGNOSIS — Z9889 Other specified postprocedural states: Secondary | ICD-10-CM

## 2019-02-01 DIAGNOSIS — M76821 Posterior tibial tendinitis, right leg: Secondary | ICD-10-CM

## 2019-02-01 NOTE — Progress Notes (Signed)
Subjective:  Patient ID: Sandra Bean, female    DOB: May 19, 1960,  MRN: [id number]  Chief Complaint  Patient presents with  . Routine Post [employer] 10.12.2020 Repair Post Tibial Tendon Rt, Kidner Advanced Tendon Rt     58 y.o. female returns for post-op check.  She states that she is doing well.  She has been ambulating with a cam boot.  But if the pain gets a little worse she goes back into the cam boot.  She has been doing well overall.  She has been healing very fast.  Stitches are intact and will be removed today.  She has been doing her own physical therapy at home.  She denies any other acute complaints.  She is nonweightbearing with her scooter.  Review of Systems: Negative except as noted in the HPI. Denies N/V/F/Ch.  Past Medical History:  Diagnosis Date  . Anemia   . Anxiety    PTDS  . Arthritis    RIGHT SIDE  . COPD (chronic obstructive pulmonary disease) ([place])   . Depression   . Diabetes mellitus without complication (HCC)    Type 2  . Fibroids   . Fibromyalgia    DAILY PAIN  . GERD (gastroesophageal reflux disease)   . H/O hernia repair 2009  . Headache   . Hypertension   . Hyperthyroidism    NON PER PATIENT  . Mental disorder   . Mitral valve prolapse   . Ovarian cyst     Current Outpatient Medications:  .  Accu-Chek FastClix Lancets MISC, Check blood sugar once daily., Disp: , Rfl:  .  amLODipine (NORVASC) 5 MG tablet, Take 5 mg by mouth daily. , Disp: , Rfl: 0 .  aspirin EC 325 MG tablet, Take 975 mg by mouth daily as needed for mild pain (headaches)., Disp: , Rfl:  .  atorvastatin (LIPITOR) 20 MG tablet, Take 20 mg by mouth daily., Disp: , Rfl:  .  BLACK CURRANT SEED OIL PO, Take by mouth., Disp: , Rfl:  .  desvenlafaxine (PRISTIQ) 100 MG 24 hr tablet, Take 1 tablet (100 mg total) by mouth daily., Disp: 90 tablet, Rfl: 0 .  diclofenac (VOLTAREN) 75 MG EC tablet, Take 75 mg by mouth 2 (two) times daily., Disp: , Rfl:  .  ECHINACEA EXTRACT PO, Take by  mouth., Disp: , Rfl:  .  ELDERBERRY PO, Take by mouth., Disp: , Rfl:  .  Fe Fum-FePoly-FA-Vit C-Vit B3 (INTEGRA F) 125-1 MG CAPS, Take 1 capsule by mouth daily., Disp: 30 capsule, Rfl: 3 .  ferrous gluconate (FERGON) 324 MG tablet, , Disp: , Rfl:  .  Flaxseed, Linseed, (FLAX SEED OIL PO), Take by mouth., Disp: , Rfl:  .  fluticasone (FLONASE) 50 MCG/ACT nasal spray, 1 spray by Both Nostrils route daily., Disp: , Rfl:  .  glucose blood (ACCU-CHEK GUIDE) test strip, Check blood sugar once daily., Disp: , Rfl:  .  HYDROcodone-acetaminophen (NORCO/VICODIN) 5-325 MG tablet, TK 1 T PO  Q 6 H PRN, Disp: , Rfl:  .  ibuprofen (ADVIL,MOTRIN) 600 MG tablet, Take 1 tablet (600 mg total) by mouth every 6 (six) hours as needed (mild pain)., Disp: 30 tablet, Rfl: 0 .  JARDIANCE 10 MG TABS tablet, Take 10 mg by mouth daily. , Disp: , Rfl:  .  Lancets (ONETOUCH ULTRASOFT) lancets, , Disp: , Rfl:  .  levofloxacin (LEVAQUIN) 500 MG tablet, TK 1 T PO D FOR 10 DAYS, Disp: , Rfl:  .  losartan-hydrochlorothiazide (HYZAAR) 100-25 MG tablet, Take 1 tablet by mouth once daily, Disp: , Rfl: 0 .  meloxicam (MOBIC) 15 MG tablet, , Disp: , Rfl:  .  metFORMIN (GLUCOPHAGE) 500 MG tablet, Take 1 tablet (500 mg total) by mouth 2 (two) times daily with a meal. (Patient taking differently: Take 1,000 mg by mouth 2 (two) times daily with a meal. ), Disp: 60 tablet, Rfl: 3 .  methocarbamol (ROBAXIN) 500 MG tablet, Take 1 tablet (500 mg total) by mouth every 8 (eight) hours as needed., Disp: 15 tablet, Rfl: 0 .  metoprolol tartrate (LOPRESSOR) 50 MG tablet, Take 50 mg by mouth daily. , Disp: , Rfl: 0 .  Multiple Vitamin (MULTIVITAMIN IRON-FREE) TABS, Take 1 tablet by mouth daily., Disp: , Rfl:  .  Multiple Vitamins-Minerals (HAIR SKIN AND NAILS FORMULA) TABS, Take 1 tablet by mouth daily. , Disp: , Rfl:  .  naproxen (NAPROSYN) 500 MG tablet, Take 1 tablet (500 mg total) by mouth 2 (two) times daily., Disp: 10 tablet, Rfl: 0 .   nicotine (NICODERM CQ - DOSED IN MG/24 HOURS) 14 mg/24hr patch, Place onto the skin., Disp: , Rfl:  .  Omega-3 Fatty Acids (FISH OIL) 1000 MG CAPS, Take by mouth., Disp: , Rfl:  .  omeprazole (PRILOSEC) 20 MG capsule, Take 20 mg by mouth daily. , Disp: , Rfl:  .  potassium chloride (MICRO-K) 10 MEQ CR capsule, Take 10 mEq by mouth 2 (two) times daily., Disp: , Rfl:  .  pramipexole (MIRAPEX) 1.5 MG tablet, Take 1 tablet (1.5 mg total) by mouth at bedtime., Disp: 135 tablet, Rfl: 0 .  predniSONE (STERAPRED UNI-PAK 21 TAB) 10 MG (21) TBPK tablet, TK UTD, Disp: , Rfl:  .  sodium chloride (OCEAN) 0.65 % SOLN nasal spray, Place 1 spray into both nostrils daily as needed for congestion. , Disp: , Rfl:  .  traZODone (DESYREL) 100 MG tablet, Take 2-3 tablets (200-300 mg total) by mouth at bedtime as needed for sleep., Disp: 60 tablet, Rfl: 2 .  vitamin C (ASCORBIC ACID) 500 MG tablet, Take 500 mg by mouth daily., Disp: , Rfl:  .  atorvastatin (LIPITOR) 20 MG tablet, Take 20 mg by mouth daily. , Disp: , Rfl:  .  ferrous sulfate 325 (65 FE) MG tablet, Take 1 tablet (325 mg total) by mouth daily for 7 days., Disp: 7 tablet, Rfl: 0 .  potassium chloride (K-DUR) 10 MEQ tablet, Take 1 tablet (10 mEq total) by mouth daily for 4 days., Disp: 4 tablet, Rfl: 0  Social History   Tobacco Use  Smoking Status Current Every Day Smoker  . Packs/day: 0.50  . Types: Cigarettes  Smokeless Tobacco Never Used    Allergies  Allergen Reactions  . Famotidine Nausea Only  . Mirtazapine Other (See Comments)    Overly drowsy, couldn't function    Objective:  There were no vitals filed for this visit. There is no height or weight on file to calculate BMI. Constitutional Well developed. Well nourished.  Vascular Foot warm and well perfused. Capillary refill normal to all digits.   Neurologic Normal speech. Oriented to person, place, and time. Epicritic sensation to light touch grossly present bilaterally.   Dermatologic Skin healing well without signs of infection. Skin edges well coapted without signs of infection.  Orthopedic: Tenderness to palpation noted about the surgical site.   Radiographs: None Assessment:   1. Post-operative state   2. Posterior tibial tendonitis, right   3. Tendonitis  Plan:  Patient was evaluated and treated and all questions answered.  S/p foot surgery right -Progressing as expected post-operatively. -XR: None -WB Status: Weightbearing as tolerated in regular sneakers -Sutures: Sutures were removed.  No dehiscence noted -Medications: None -Foot redressed.  Onychomycosis (nail fungus) bilateral feet -I explained to the patient and educated her on etiology of onychomycosis and various treatment options available.  Patient will elect to undergo p.o. Lamisil therapy course.  I will see the patient back again in 3 months to evaluate how the course is doing.  I sent hepatic function test to be obtained and I will start the Lamisil therapy afterwards.  No follow-ups on file.

## 2019-02-02 ENCOUNTER — Telehealth: Payer: Self-pay | Admitting: Podiatry

## 2019-02-02 ENCOUNTER — Telehealth: Payer: Self-pay | Admitting: *Deleted

## 2019-02-02 DIAGNOSIS — Z79899 Other long term (current) drug therapy: Secondary | ICD-10-CM

## 2019-02-02 NOTE — Telephone Encounter (Signed)
Left message informing pt I would be mailing a copy of the lab Dr. Posey Pronto would like performed prior to beginning the medication he had discussed at her last visit, and once reviewed we would call with instructions. Mailed orders with letter of explanation.

## 2019-02-02 NOTE — Telephone Encounter (Signed)
-----   Message from Felipa Furnace, DPM sent at 02/01/2019 11:47 AM EST ----- Reina Fuse,  Can you send this patient liver function test.  In order to start on Lamisil therapy    Thank you

## 2019-02-02 NOTE — Telephone Encounter (Signed)
Pt called stating she has just talked to her insurance ([employer]) and she was told they would cover the orthotics at 80% and she would be responsible for the 20%. She said we have to do a pre authorization and needs to state for orthotic fitting,adjustments and fabrications.She needs them due to her surgery and being flat foot to keep it from deteriorating again.  I tried explaining that they follow same quidelines as medicare and they have to meet a specific criteria and they do not . I explained that if they do not cover them she would be responsible for the 398.00.  I asked if she got a reference number for her call and she did not and I also asked if she got a fax number and she did not.  I explained to pt that I would have to call to get the prior auth and get a fax number and I would do it as soon as I could.

## 2019-02-08 ENCOUNTER — Ambulatory Visit (INDEPENDENT_AMBULATORY_CARE_PROVIDER_SITE_OTHER): Payer: Medicare PPO | Admitting: Licensed Clinical Social Worker

## 2019-02-08 DIAGNOSIS — F411 Generalized anxiety disorder: Secondary | ICD-10-CM

## 2019-02-08 DIAGNOSIS — F5102 Adjustment insomnia: Secondary | ICD-10-CM

## 2019-02-08 DIAGNOSIS — F4310 Post-traumatic stress disorder, unspecified: Secondary | ICD-10-CM

## 2019-02-08 DIAGNOSIS — F331 Major depressive disorder, recurrent, moderate: Secondary | ICD-10-CM

## 2019-02-08 NOTE — Progress Notes (Signed)
Virtual Visit via Video Note  I connected with Sandra Bean on 02/08/19 at  3:00 PM EST by a video enabled telemedicine application and verified that I am speaking with the correct person using two identifiers.   I discussed the limitations of evaluation and management by telemedicine and the availability of in person appointments. The patient expressed understanding and agreed to proceed.  I discussed the assessment and treatment plan with the patient. The patient was provided an opportunity to ask questions and all were answered. The patient agreed with the plan and demonstrated an understanding of the instructions.   The patient was advised to call back or seek an in-person evaluation if the symptoms worsen or if the condition fails to improve as anticipated.  I provided 52 minutes of non-face-to-face time during this encounter.   THERAPIST PROGRESS NOTE  Session Time: 3:02 PM to 3:54 PM  Participation Level: Active  Behavioral Response: CasualAlertDepressed-in session appropriate  Type of Therapy: Individual Therapy  Treatment Goals addressed:  processes through trauma to let go the past, challenge schemas from the past which are distorted and damaging the present, decrease trauma symptoms, coping Interventions: Solution Focused, Strength-based, Supportive and Other: healty relationship skills, coping  Summary: Sandra Bean is a 58 y.o. female who presents with surgery for foot on October 12. I am healing and released for doctor in 4 weeks instead of 6. Walking on foot. Helpful neighbors. Long time friend stayed a week. Did good and could be worse. Two friends didn't come through realized not friends and walked away from them. Found neighbors who helped. Lost two friends but glad I did because they didn't have to be in my life. When things happen you find out who people the people who are there for you. Spirituality played significant role in helping guide her, also patient shares  it is there issue and they have to deal with it and cut it off. I have an issue with forgiveness but forgave them. Can't be in my life. Let go of taking care of niece, husband made a big deal of her coming to stay with patient and patient let that go as well. It was a nightmare that she had been continuing to hold on to. These are all of nieces choices of not taking care of health. Too emotionally difficult for me. Refocus on myself to take care of self. "Whatever happens is going to happen." Single alone and no family but no one is there for me. Hasn't changed. I have to change me. Started coming to therapy to address issues to change my life. In therapy to do this and not going to take on others issues. Doesn't feel bad. Refusing to do things on their terms. They choose it. Have to do the same thing for past that doing in present, to forgive and not hold on to things. Not hold on to things that are negative.  Therapist assessed patient having spirituality to guide her for choices and insight.  Patient shares letting go it was amazing and uplifted me 100%.  Thank both neighbors that helped her.  Patient describes "did not do without" meaning there were people there to help her. Life full of negative people. I don't want those people in life. I am a giver and caretaker and people take advantage but I am not stupid. Deal with it different but know there has been growth with church, Bible study and therapy. Want to move forward because I feel the difference,  peace in my life. In past would have been confrontational with friends. Able to explain to friend directly and calmly her feelings. "Whatever you put out there what you are going to get." Freeing myself of anger so can't add anymore to what she already has. Still need to go through hurt and pain from family. I forgive them but release pain to get past it. I need to speak it. I can't be blessed if I can't forgive. There is a spiritual side but there is an emotional  side. If get out maybe not too late to fix it to get rid of it. Reviewed questions therapist can explore with her including how would it make you feel as a teenager and how does it make her feel now. Access it be able to forgive it and move on. Forgiving is freedom of God, forgive it but also needs to be released emotionally. With friends forgive spiritually and released emotionally with friends. Realize need to do that with family. Sessions share the pain. Work on emotionally detached myself. Now sees capable and don't have to hold on to pain. "what held me was that it was my family, I had to continue relationships" In reality is that I didn't have to and could have emotionally detach myself from it. At the end started to but haven't overall. Knows what she needs and so better focus. Wants to feel good about family the way she does about letting go of negative relationships.Reviewed motivation is down right now, may feel release and be a help to depression to work on past.    Suicidal/Homicidal: No  Therapist Response: Therapist offered supportive, open questions, active listening, facilitated expression of thoughts and feelings, reviewed symptoms.  Reviewed patient cutting off negative up on her life as healthy interpersonal skills, involved also is letting go helpful to release oneself from being way down by negative impact of negative relationships.  Reviewed that she does not need to be weighed down more than she already is way down and she has to release herself from negative relationship in the present the way she does in the past.  Reviewed that just like patient released emotional energy from negative relationships she has to do that related to past trauma.  Reviewed patient has to work through anger and loss and working through trauma.  Provided positive feedback for patient's effective communication skills being an open and honest in her relationships, also noted progress that she is able to do that  peacefully and calmly without being abrupt and abrasive.  Reviewed progress as well through spiritual influence of noting progress in finding more peace and wants to continue that process working on past issues with therapist as well is continuing to make healthy choices for herself to cope and present.  Provided strength based.   Plan: Return again in 2-3 weeks.2.  Work with patient on past trauma, depression  Diagnosis: Axis I: major depressive disorder, recurrent, moderate, generalized anxiety disorder, PTSD, adjustment insomnia    Axis II: No diagnosis    [name], LCSW 02/08/2019

## 2019-02-13 ENCOUNTER — Telehealth: Payer: Self-pay | Admitting: Podiatry

## 2019-02-13 NOTE — Telephone Encounter (Signed)
Left message for pt that I spoke to [place] and he thinks it would be a good idea to hold off on picking the orthotics up until we receive the pre determination from [place].  I have faxed the office notes today to the number [facility] left for me for predetermination for L3010(orthotics)  The person that left message from [place] was Theo. Fax # [phone].HEr phone # is [phone] ext [phone].Marland KitchenMarland Kitchen

## 2019-02-13 NOTE — Telephone Encounter (Signed)
Pt called and spoke to [facility] and they are wanting records to do a predetermination for the orthotics. They are wanting to using code L3010.  Does pt need to change appt until after we get the predetermination.Her appt is 11.30.2020 to pick them up currently.

## 2019-02-20 ENCOUNTER — Other Ambulatory Visit: Payer: Medicare PPO | Admitting: Orthotics

## 2019-03-03 ENCOUNTER — Ambulatory Visit (HOSPITAL_COMMUNITY): Payer: Medicare PPO | Admitting: Licensed Clinical Social Worker

## 2019-03-31 ENCOUNTER — Encounter: Payer: Self-pay | Admitting: Podiatry

## 2019-03-31 ENCOUNTER — Ambulatory Visit (INDEPENDENT_AMBULATORY_CARE_PROVIDER_SITE_OTHER): Payer: Medicare PPO | Admitting: Podiatry

## 2019-03-31 ENCOUNTER — Other Ambulatory Visit: Payer: Self-pay

## 2019-03-31 DIAGNOSIS — M7671 Peroneal tendinitis, right leg: Secondary | ICD-10-CM

## 2019-03-31 DIAGNOSIS — M25571 Pain in right ankle and joints of right foot: Secondary | ICD-10-CM | POA: Diagnosis not present

## 2019-03-31 NOTE — Progress Notes (Signed)
Subjective:  Patient ID: Sandra Bean, female    DOB: Aug 05, 1960,  MRN: [id number]  Chief Complaint  Patient presents with  . Routine Post Op    pt is here for a routine post op, pt states that the right foot has started to hurt recently, pain has been going on for about 3 weeks, pain is on the opposite of the surgical site, pt also states that pain has come suddenly, and that there is no apparent injury since then    59 y.o. female presents with the above complaint.  Patient presents with pain to the lateral side of the foot.  Patient is well-known to me for during the posterior tibial tendon on the right foot.  She states that the tendon site is doing great.  She occasionally has random pains however overall she is very happy with the surgery.  She now has a secondary pain to the lateral aspect of the foot along the course of the peroneal tendon.  She states the pains has been going on for now Bean 3 weeks and has progressive gotten worse.  She states it hurts when ambulating.  She just wanted to make sure that there was not anything else going on.  She has not obtain orthotics yet because she is fine without insurance company to cover them.  She denies any other acute complaints at this time.  Patient has tried icing it but has not helped much.  She denies any other acute complaints at this time.   Review of Systems: Negative except as noted in the HPI. Denies N/V/F/Ch.  Past Medical History:  Diagnosis Date  . Anemia   . Anxiety    PTDS  . Arthritis    RIGHT SIDE  . COPD (chronic obstructive pulmonary disease) ([place])   . Depression   . Diabetes mellitus without complication (HCC)    Type 2  . Fibroids   . Fibromyalgia    DAILY PAIN  . GERD (gastroesophageal reflux disease)   . H/O hernia repair 2009  . Headache   . Hypertension   . Hyperthyroidism    NON PER PATIENT  . Mental disorder   . Mitral valve prolapse   . Ovarian cyst     Current Outpatient Medications:  .   Accu-Chek FastClix Lancets MISC, Check blood sugar once daily., Disp: , Rfl:  .  amLODipine (NORVASC) 5 MG tablet, Take 5 mg by mouth daily. , Disp: , Rfl: 0 .  aspirin EC 325 MG tablet, Take 975 mg by mouth daily as needed for mild pain (headaches)., Disp: , Rfl:  .  atorvastatin (LIPITOR) 20 MG tablet, Take 20 mg by mouth daily., Disp: , Rfl:  .  BLACK CURRANT SEED OIL PO, Take by mouth., Disp: , Rfl:  .  desvenlafaxine (PRISTIQ) 100 MG 24 hr tablet, Take 1 tablet (100 mg total) by mouth daily., Disp: 90 tablet, Rfl: 0 .  diclofenac (VOLTAREN) 75 MG EC tablet, Take 75 mg by mouth 2 (two) times daily., Disp: , Rfl:  .  ECHINACEA EXTRACT PO, Take by mouth., Disp: , Rfl:  .  ELDERBERRY PO, Take by mouth., Disp: , Rfl:  .  Fe Fum-FePoly-FA-Vit C-Vit B3 (INTEGRA F) 125-1 MG CAPS, Take 1 capsule by mouth daily., Disp: 30 capsule, Rfl: 3 .  ferrous gluconate (FERGON) 324 MG tablet, , Disp: , Rfl:  .  Flaxseed, Linseed, (FLAX SEED OIL PO), Take by mouth., Disp: , Rfl:  .  fluticasone (FLONASE) 50 MCG/ACT  nasal spray, 1 spray by Both Nostrils route daily., Disp: , Rfl:  .  glucose blood (ACCU-CHEK GUIDE) test strip, Check blood sugar once daily., Disp: , Rfl:  .  HYDROcodone-acetaminophen (NORCO/VICODIN) 5-325 MG tablet, TK 1 T PO  Q 6 H PRN, Disp: , Rfl:  .  ibuprofen (ADVIL,MOTRIN) 600 MG tablet, Take 1 tablet (600 mg total) by mouth every 6 (six) hours as needed (mild pain)., Disp: 30 tablet, Rfl: 0 .  JARDIANCE 10 MG TABS tablet, Take 10 mg by mouth daily. , Disp: , Rfl:  .  Lancets (ONETOUCH ULTRASOFT) lancets, , Disp: , Rfl:  .  levofloxacin (LEVAQUIN) 500 MG tablet, TK 1 T PO D FOR 10 DAYS, Disp: , Rfl:  .  linagliptin (TRADJENTA) 5 MG TABS tablet, Take by mouth., Disp: , Rfl:  .  losartan-hydrochlorothiazide (HYZAAR) 100-25 MG tablet, Take 1 tablet by mouth once daily, Disp: , Rfl: 0 .  meloxicam (MOBIC) 15 MG tablet, , Disp: , Rfl:  .  metFORMIN (GLUCOPHAGE) 500 MG tablet, Take 1 tablet (500  mg total) by mouth 2 (two) times daily with a meal. (Patient taking differently: Take 1,000 mg by mouth 2 (two) times daily with a meal. ), Disp: 60 tablet, Rfl: 3 .  methocarbamol (ROBAXIN) 500 MG tablet, Take 1 tablet (500 mg total) by mouth every 8 (eight) hours as needed., Disp: 15 tablet, Rfl: 0 .  metoprolol tartrate (LOPRESSOR) 50 MG tablet, Take 50 mg by mouth daily. , Disp: , Rfl: 0 .  Multiple Vitamin (MULTIVITAMIN IRON-FREE) TABS, Take 1 tablet by mouth daily., Disp: , Rfl:  .  Multiple Vitamins-Minerals (HAIR SKIN AND NAILS FORMULA) TABS, Take 1 tablet by mouth daily. , Disp: , Rfl:  .  naproxen (NAPROSYN) 500 MG tablet, Take 1 tablet (500 mg total) by mouth 2 (two) times daily., Disp: 10 tablet, Rfl: 0 .  nicotine (NICODERM CQ - DOSED IN MG/24 HOURS) 14 mg/24hr patch, Place onto the skin., Disp: , Rfl:  .  Omega-3 Fatty Acids (FISH OIL) 1000 MG CAPS, Take by mouth., Disp: , Rfl:  .  omeprazole (PRILOSEC) 20 MG capsule, Take 20 mg by mouth daily. , Disp: , Rfl:  .  potassium chloride (MICRO-K) 10 MEQ CR capsule, Take 10 mEq by mouth 2 (two) times daily., Disp: , Rfl:  .  pramipexole (MIRAPEX) 1.5 MG tablet, Take 1 tablet (1.5 mg total) by mouth at bedtime., Disp: 135 tablet, Rfl: 0 .  predniSONE (STERAPRED UNI-PAK 21 TAB) 10 MG (21) TBPK tablet, TK UTD, Disp: , Rfl:  .  sodium chloride (OCEAN) 0.65 % SOLN nasal spray, Place 1 spray into both nostrils daily as needed for congestion. , Disp: , Rfl:  .  traZODone (DESYREL) 100 MG tablet, Take 2-3 tablets (200-300 mg total) by mouth at bedtime as needed for sleep., Disp: 60 tablet, Rfl: 2 .  vitamin C (ASCORBIC ACID) 500 MG tablet, Take 500 mg by mouth daily., Disp: , Rfl:  .  atorvastatin (LIPITOR) 20 MG tablet, Take 20 mg by mouth daily. , Disp: , Rfl:  .  ferrous sulfate 325 (65 FE) MG tablet, Take 1 tablet (325 mg total) by mouth daily for 7 days., Disp: 7 tablet, Rfl: 0 .  potassium chloride (K-DUR) 10 MEQ tablet, Take 1 tablet (10 mEq  total) by mouth daily for 4 days., Disp: 4 tablet, Rfl: 0  Social History   Tobacco Use  Smoking Status Current Every Day Smoker  . Packs/day: 0.50  .  Types: Cigarettes  Smokeless Tobacco Never Used    Allergies  Allergen Reactions  . Famotidine Nausea Only  . Mirtazapine Other (See Comments)    Overly drowsy, couldn't function    Objective:  There were no vitals filed for this visit. There is no height or weight on file to calculate BMI. Constitutional Well developed. Well nourished.  Vascular Dorsalis pedis pulses palpable bilaterally. Posterior tibial pulses palpable bilaterally. Capillary refill normal to all digits.  No cyanosis or clubbing noted. Pedal hair growth normal.  Neurologic Normal speech. Oriented to person, place, and time. Epicritic sensation to light touch grossly present bilaterally.  Dermatologic Nails well groomed and normal in appearance. No open wounds. No skin lesions.  Orthopedic:  No pain on palpation to the course of the posterior tibial tendon Achilles insertion.  No pain on eversion resisted active and passive range of motion of the foot.  Pain along the course of the peroneal tendon with maximal point of tenderness at the posterior aspect of the retrofibular.  There is mild pain at the insertion on the fifth metatarsal base.  Pain with eversion and inversion range of motion active and passive.   Radiographs: None Assessment:   1. Peroneal tendinitis, right   2. Acute right ankle pain    Plan:  Patient was evaluated and treated and all questions answered.  Peroneal tendinitis right foot -I explained to the patient the etiology of peroneal tendinitis with various treatment options associated with it.  Given that patient has a slight changes in the gait while ambulating I believe that it may have aggravated her peroneal tendinitis.  I explained to the patient that given the amount of pain that she has however which is mild to moderate I believe  she may benefit from a steroid injection within the tendon sheath but not within the tendon itself.  I explained to her that is a high risk risk of rupture with steroid injection.  Patient states that she agrees with the plan and would Bean to proceed with the injection. -A steroid injection was performed at right lateral ankle at the point of maximal tenderness using 1% plain Lidocaine and 10 mg of Kenalog. This was well tolerated. -Tri-Lock ankle brace was dispensed to help hold the ankle in a good position and help stabilize the tendon itself.   Return in about 4 weeks (around 04/28/2019).

## 2019-04-05 ENCOUNTER — Ambulatory Visit (INDEPENDENT_AMBULATORY_CARE_PROVIDER_SITE_OTHER): Payer: Medicare PPO | Admitting: Licensed Clinical Social Worker

## 2019-04-05 DIAGNOSIS — F331 Major depressive disorder, recurrent, moderate: Secondary | ICD-10-CM

## 2019-04-05 DIAGNOSIS — F5102 Adjustment insomnia: Secondary | ICD-10-CM | POA: Diagnosis not present

## 2019-04-05 DIAGNOSIS — F4310 Post-traumatic stress disorder, unspecified: Secondary | ICD-10-CM

## 2019-04-05 DIAGNOSIS — F411 Generalized anxiety disorder: Secondary | ICD-10-CM | POA: Diagnosis not present
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
## 2019-04-20 ENCOUNTER — Ambulatory Visit (INDEPENDENT_AMBULATORY_CARE_PROVIDER_SITE_OTHER): Payer: Medicare PPO | Admitting: Psychiatry

## 2019-04-20 DIAGNOSIS — F331 Major depressive disorder, recurrent, moderate: Secondary | ICD-10-CM | POA: Diagnosis not present

## 2019-04-20 DIAGNOSIS — F5102 Adjustment insomnia: Secondary | ICD-10-CM

## 2019-04-20 DIAGNOSIS — F411 Generalized anxiety disorder: Secondary | ICD-10-CM

## 2019-04-20 DIAGNOSIS — F4310 Post-traumatic stress disorder, unspecified: Secondary | ICD-10-CM | POA: Diagnosis not present

## 2019-04-20 MED ORDER — TRAZODONE HCL 100 MG PO TABS: 200.0000 mg | ORAL_TABLET | Freq: Every evening | ORAL | 2 refills | Status: DC | PRN

## 2019-04-20 MED ORDER — DESVENLAFAXINE SUCCINATE ER 100 MG PO TB24: 100.0000 mg | ORAL_TABLET | Freq: Every day | ORAL | 0 refills | Status: DC

## 2019-04-20 MED ORDER — PRAMIPEXOLE DIHYDROCHLORIDE 1.5 MG PO TABS: 1.5000 mg | ORAL_TABLET | Freq: Every day | ORAL | 0 refills | Status: DC
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
## 2019-04-27 ENCOUNTER — Other Ambulatory Visit: Payer: Self-pay

## 2019-04-27 ENCOUNTER — Ambulatory Visit (HOSPITAL_COMMUNITY): Payer: Medicare PPO | Admitting: Licensed Clinical Social Worker

## 2019-04-28 ENCOUNTER — Encounter: Payer: Medicare PPO | Admitting: Podiatry

## 2019-05-04 ENCOUNTER — Ambulatory Visit (HOSPITAL_COMMUNITY): Payer: Medicare PPO | Admitting: Licensed Clinical Social Worker

## 2019-05-04 ENCOUNTER — Other Ambulatory Visit: Payer: Self-pay

## 2019-08-18 ENCOUNTER — Telehealth (INDEPENDENT_AMBULATORY_CARE_PROVIDER_SITE_OTHER): Payer: Medicare PPO | Admitting: Psychiatry

## 2019-08-18 ENCOUNTER — Encounter (HOSPITAL_COMMUNITY): Payer: Self-pay | Admitting: Psychiatry

## 2019-08-18 DIAGNOSIS — F331 Major depressive disorder, recurrent, moderate: Secondary | ICD-10-CM

## 2019-08-18 DIAGNOSIS — F4310 Post-traumatic stress disorder, unspecified: Secondary | ICD-10-CM

## 2019-08-18 DIAGNOSIS — F411 Generalized anxiety disorder: Secondary | ICD-10-CM

## 2019-08-18 MED ORDER — PRAMIPEXOLE DIHYDROCHLORIDE 1.5 MG PO TABS: 1.5000 mg | ORAL_TABLET | Freq: Every day | ORAL | 0 refills | Status: DC

## 2019-08-18 MED ORDER — TRAZODONE HCL 100 MG PO TABS: 200.0000 mg | ORAL_TABLET | Freq: Every evening | ORAL | 2 refills | Status: DC | PRN

## 2019-08-18 MED ORDER — DESVENLAFAXINE SUCCINATE ER 100 MG PO TB24: 100.0000 mg | ORAL_TABLET | Freq: Every day | ORAL | 0 refills | Status: DC
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
## 2019-10-04 ENCOUNTER — Ambulatory Visit: Payer: Medicare PPO | Admitting: Obstetrics & Gynecology

## 2019-11-21 ENCOUNTER — Telehealth (HOSPITAL_COMMUNITY): Payer: Medicare PPO | Admitting: Psychiatry

## 2019-12-05 ENCOUNTER — Telehealth (INDEPENDENT_AMBULATORY_CARE_PROVIDER_SITE_OTHER): Payer: Medicare PPO | Admitting: Psychiatry

## 2019-12-05 ENCOUNTER — Encounter (HOSPITAL_COMMUNITY): Payer: Self-pay | Admitting: Psychiatry

## 2019-12-05 DIAGNOSIS — F331 Major depressive disorder, recurrent, moderate: Secondary | ICD-10-CM

## 2019-12-05 DIAGNOSIS — F411 Generalized anxiety disorder: Secondary | ICD-10-CM

## 2019-12-05 DIAGNOSIS — F4310 Post-traumatic stress disorder, unspecified: Secondary | ICD-10-CM | POA: Diagnosis not present

## 2019-12-05 MED ORDER — DESVENLAFAXINE SUCCINATE ER 100 MG PO TB24: 100.0000 mg | ORAL_TABLET | Freq: Every day | ORAL | 0 refills | Status: DC

## 2019-12-05 MED ORDER — TRAZODONE HCL 100 MG PO TABS: 200.0000 mg | ORAL_TABLET | Freq: Every evening | ORAL | 2 refills | Status: DC | PRN
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
## 2019-12-08 ENCOUNTER — Other Ambulatory Visit: Payer: Self-pay

## 2019-12-08 ENCOUNTER — Encounter: Payer: Self-pay | Admitting: Obstetrics & Gynecology

## 2019-12-08 ENCOUNTER — Ambulatory Visit (INDEPENDENT_AMBULATORY_CARE_PROVIDER_SITE_OTHER): Payer: Medicare PPO | Admitting: Obstetrics & Gynecology

## 2019-12-08 ENCOUNTER — Other Ambulatory Visit (HOSPITAL_COMMUNITY)
Admission: RE | Admit: 2019-12-08 | Discharge: 2019-12-08 | Disposition: A | Discharge status: Home / Self Care | Payer: Medicare PPO | Source: Ambulatory Visit | Attending: Obstetrics & Gynecology | Admitting: Obstetrics & Gynecology

## 2019-12-08 VITALS — BP 143/80 | HR 72 | Ht 66.0 in | Wt 161.1 lb

## 2019-12-08 DIAGNOSIS — Z01419 Encounter for gynecological examination (general) (routine) without abnormal findings: Secondary | ICD-10-CM | POA: Diagnosis not present

## 2019-12-08 DIAGNOSIS — Z1151 Encounter for screening for human papillomavirus (HPV): Secondary | ICD-10-CM | POA: Diagnosis not present

## 2019-12-08 NOTE — Progress Notes (Signed)
Last pap: 10/07/2018 Declines STI testing Declines Flu Shot Mammo: Friday 24th @ [facility]

## 2019-12-08 NOTE — Progress Notes (Signed)
Subjective:     Sandra Bean is a 59 y.o. female here for a routine exam.  Pt os s/p supracervical hyst. 12/2016. Current complaints: no GYN concerns. Pt has had foot surgery since her last appt and has also been plagued by freq nose bleeds leading to anemia. She followed by hematology and ENT.  Had colonoscopy last year.      Gynecologic History No LMP recorded. Patient is postmenopausal. Contraception: post menopausal status Last Pap: 10/07/2018. Results were: normal Last mammogram: 09/2018. Results were: birad. Has appt next week for f/u  Obstetric History OB History  Gravida Para Term Preterm AB Living  3       3    SAB TAB Ectopic Multiple Live Births    3          # Outcome Date GA Lbr Len/2nd Weight Sex Delivery Anes PTL Lv  3 TAB           2 TAB           1 TAB            The following portions of the patient's history were reviewed and updated as appropriate: allergies, current medications, past family history, past medical history, past social history, past surgical history and problem list.  Review of Systems Pertinent items are noted in HPI.    Objective:  BP (!) 143/80    Pulse 72    Ht 5\' 6"  (1.676 m)    Wt 161 lb 1.3 oz (73.1 kg)    BMI 26.00 kg/m  General Appearance:    Alert, cooperative, no distress, appears stated age  Head:    Normocephalic, without obvious abnormality, atraumatic  Eyes:    conjunctiva/corneas clear, EOM's intact, both eyes  Ears:    Normal external ear canals, both ears  Nose:   Nares normal, septum midline, mucosa normal, no drainage    or sinus tenderness  Throat:   Lips, mucosa, and tongue normal; teeth and gums normal  Neck:   Supple, symmetrical, trachea midline, no adenopathy;    thyroid:  no enlargement/tenderness/nodules  Back:     Symmetric, no curvature, ROM normal, no CVA tenderness  Lungs:     respirations unlabored  Chest Wall:    No tenderness or deformity   Heart:    Regular rate and rhythm  Breast Exam:    No tenderness,  masses, or nipple abnormality  Abdomen:     Soft, non-tender, bowel sounds active all four quadrants,    no masses, no organomegaly  Genitalia:    Normal female without lesion, discharge or tenderness   Cervix almost flush with vaginal wall. Atrophic.   Extremities:   Extremities normal, atraumatic, no cyanosis or edema  Pulses:   2+ and symmetric all extremities  Skin:   Skin color, texture, turgor normal, no rashes or lesions      Assessment:    Healthy female exam.  Pt s/p supracervical hyst. Breast cancer screening- has mammogram appt for next Sat (1 week)   Plan:   F/u PAP with hrHPV Screening mammogram F/u in 1 year or sooner prn  [name] L. Harraway-Smith, M.D., [name]

## 2019-12-12 LAB — CYTOLOGY - PAP
Comment: NEGATIVE
High risk HPV: NEGATIVE

## 2019-12-14 ENCOUNTER — Telehealth: Payer: Self-pay

## 2019-12-14 NOTE — Telephone Encounter (Signed)
-----   Message from Sloan Leiter, MD sent at 12/13/2019  3:26 PM EDT ----- Please let patient know pap was slightly abnormal, but recommendation for follow up is 3 years based on ASCCP guidelines

## 2019-12-14 NOTE — Telephone Encounter (Signed)
Patient has been notified of pap results and will follow up in three years for her next pap.

## 2019-12-14 NOTE — Telephone Encounter (Signed)
Called pt to discuss Pap smear results. Left message for pt to call the office back. Gearold Wainer l Jajaira Ruis, CMA

## 2019-12-20 ENCOUNTER — Ambulatory Visit (INDEPENDENT_AMBULATORY_CARE_PROVIDER_SITE_OTHER): Payer: Medicare PPO | Admitting: Licensed Clinical Social Worker

## 2019-12-20 DIAGNOSIS — F411 Generalized anxiety disorder: Secondary | ICD-10-CM

## 2019-12-20 DIAGNOSIS — F331 Major depressive disorder, recurrent, moderate: Secondary | ICD-10-CM | POA: Diagnosis not present

## 2019-12-20 DIAGNOSIS — F5102 Adjustment insomnia: Secondary | ICD-10-CM

## 2019-12-20 DIAGNOSIS — F4310 Post-traumatic stress disorder, unspecified: Secondary | ICD-10-CM
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
## 2019-12-25 ENCOUNTER — Ambulatory Visit (INDEPENDENT_AMBULATORY_CARE_PROVIDER_SITE_OTHER): Payer: Medicare PPO | Admitting: Licensed Clinical Social Worker

## 2019-12-25 DIAGNOSIS — F4310 Post-traumatic stress disorder, unspecified: Secondary | ICD-10-CM | POA: Diagnosis not present

## 2019-12-25 DIAGNOSIS — F331 Major depressive disorder, recurrent, moderate: Secondary | ICD-10-CM | POA: Diagnosis not present

## 2019-12-25 DIAGNOSIS — F411 Generalized anxiety disorder: Secondary | ICD-10-CM | POA: Diagnosis not present

## 2019-12-25 DIAGNOSIS — F5102 Adjustment insomnia: Secondary | ICD-10-CM
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
## 2019-12-29 ENCOUNTER — Encounter (HOSPITAL_COMMUNITY): Payer: Self-pay | Admitting: Psychiatry

## 2019-12-29 ENCOUNTER — Telehealth (INDEPENDENT_AMBULATORY_CARE_PROVIDER_SITE_OTHER): Payer: Medicare PPO | Admitting: Psychiatry

## 2019-12-29 DIAGNOSIS — F411 Generalized anxiety disorder: Secondary | ICD-10-CM | POA: Diagnosis not present

## 2019-12-29 DIAGNOSIS — F331 Major depressive disorder, recurrent, moderate: Secondary | ICD-10-CM

## 2019-12-29 DIAGNOSIS — F4310 Post-traumatic stress disorder, unspecified: Secondary | ICD-10-CM | POA: Diagnosis not present

## 2019-12-29 MED ORDER — DULOXETINE HCL 30 MG PO CPEP: 30.0000 mg | ORAL_CAPSULE | Freq: Every day | ORAL | 0 refills | Status: DC
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
## 2020-01-16 ENCOUNTER — Telehealth (HOSPITAL_COMMUNITY): Payer: Medicare PPO | Admitting: Psychiatry

## 2020-01-22 ENCOUNTER — Ambulatory Visit (INDEPENDENT_AMBULATORY_CARE_PROVIDER_SITE_OTHER): Payer: Medicare PPO | Admitting: Licensed Clinical Social Worker

## 2020-01-22 DIAGNOSIS — F4310 Post-traumatic stress disorder, unspecified: Secondary | ICD-10-CM

## 2020-01-22 DIAGNOSIS — F331 Major depressive disorder, recurrent, moderate: Secondary | ICD-10-CM | POA: Diagnosis not present

## 2020-01-22 DIAGNOSIS — F5102 Adjustment insomnia: Secondary | ICD-10-CM | POA: Diagnosis not present

## 2020-01-22 DIAGNOSIS — F411 Generalized anxiety disorder: Secondary | ICD-10-CM | POA: Diagnosis not present
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
## 2020-01-29 ENCOUNTER — Telehealth (HOSPITAL_COMMUNITY): Payer: Medicare PPO | Admitting: Psychiatry

## 2020-01-30 ENCOUNTER — Ambulatory Visit (INDEPENDENT_AMBULATORY_CARE_PROVIDER_SITE_OTHER): Payer: Medicare PPO | Admitting: Licensed Clinical Social Worker

## 2020-01-30 DIAGNOSIS — F331 Major depressive disorder, recurrent, moderate: Secondary | ICD-10-CM | POA: Diagnosis not present

## 2020-01-30 DIAGNOSIS — F4310 Post-traumatic stress disorder, unspecified: Secondary | ICD-10-CM | POA: Diagnosis not present

## 2020-01-30 DIAGNOSIS — F5102 Adjustment insomnia: Secondary | ICD-10-CM

## 2020-01-30 DIAGNOSIS — F411 Generalized anxiety disorder: Secondary | ICD-10-CM | POA: Diagnosis not present
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
## 2020-01-31 ENCOUNTER — Encounter (HOSPITAL_COMMUNITY): Payer: Self-pay | Admitting: Psychiatry

## 2020-01-31 ENCOUNTER — Telehealth (INDEPENDENT_AMBULATORY_CARE_PROVIDER_SITE_OTHER): Payer: Medicare PPO | Admitting: Psychiatry

## 2020-01-31 DIAGNOSIS — F4310 Post-traumatic stress disorder, unspecified: Secondary | ICD-10-CM

## 2020-01-31 DIAGNOSIS — F411 Generalized anxiety disorder: Secondary | ICD-10-CM

## 2020-01-31 DIAGNOSIS — F331 Major depressive disorder, recurrent, moderate: Secondary | ICD-10-CM | POA: Diagnosis not present

## 2020-01-31 MED ORDER — DULOXETINE HCL 30 MG PO CPEP: 30.0000 mg | ORAL_CAPSULE | Freq: Every day | ORAL | 1 refills | Status: DC
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
## 2020-02-07 ENCOUNTER — Ambulatory Visit (INDEPENDENT_AMBULATORY_CARE_PROVIDER_SITE_OTHER): Payer: Medicare PPO | Admitting: Licensed Clinical Social Worker

## 2020-02-07 DIAGNOSIS — F5102 Adjustment insomnia: Secondary | ICD-10-CM | POA: Diagnosis not present

## 2020-02-07 DIAGNOSIS — F331 Major depressive disorder, recurrent, moderate: Secondary | ICD-10-CM

## 2020-02-07 DIAGNOSIS — F4310 Post-traumatic stress disorder, unspecified: Secondary | ICD-10-CM | POA: Diagnosis not present

## 2020-02-07 DIAGNOSIS — F411 Generalized anxiety disorder: Secondary | ICD-10-CM

## 2020-02-07 NOTE — Progress Notes (Signed)
Virtual Visit via Video Note  I connected with Sandra Bean on 02/07/20 at 10:00 AM EST by a video enabled telemedicine application and verified that I am speaking with the correct person using two identifiers.  Location: Patient: home Provider: home office   I discussed the limitations of evaluation and management by telemedicine and the availability of in person appointments. The patient expressed understanding and agreed to proceed.   I discussed the assessment and treatment plan with the patient. The patient was provided an opportunity to ask questions and all were answered. The patient agreed with the plan and demonstrated an understanding of the instructions.   The patient was advised to call back or seek an in-person evaluation if the symptoms worsen or if the condition fails to improve as anticipated.  I provided 55 minutes of non-face-to-face time during this encounter.  THERAPIST PROGRESS NOTE  Session Time: 10:00 AM to 10:55 AM  Participation Level: Active  Behavioral Response: CasualAlertAnxious, Dysphoric and Irritable  Type of Therapy: Individual Therapy  Treatment Goals addressed:  Continue to work on processing through trauma to let go the past ("not worked on it at the level that I need to) challenge schemas from the past which are distorted and damaging the present (causes feelings of worthless, depression, new issues piling up on top of old issues) decrease trauma symptoms, coping Interventions: Solution Focused, Strength-based, Supportive, Reframing and Other: trauma, coping  Summary: Sandra Bean is a 59 y.o. female who presents with not sleeping. Mostly because mind won't shut down, racing. In a sleep that is awake and aware, is up and down. Stopped taking Trazadone tickle in throat, choke, massive nose bleed. Tried to use Melatonin. At a great level of stress and mind won't stop racing. So exhausted so makes her days lazy because tried. Takes bed time tea as  well. Going po Push herself to get something done. Can't sleep during the day if sleep in a wake sleep. Going to play around with different things to see what might work to help her sleep at night. 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 In terms of stress things that have happened and that piled on top of each other. Went to the dentist in October and had been waiting to hear what insurance will cover. Will involve surgery and haven't done anything. Has vacation coming has to schedule that in before end of year. Put in situation doesn't know what to do. Doesn't feel like putting in the effort. 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 Patient shares that is because "too much is wrong and nothing right" zaps energy everything she is doing not right. Everything she is trying to do is in somebody else's hand. Feels defeated. Doesn't get encouragement. Shares she feels she is 59 years and no direction doesn't know what direction to do. All energy put to things failed worthless and keeps happening. Therapist challenged this schema as a trama schema imposed on recent events. Patient said even if something failed and not because of her, but effects her. Like mom's alcoholism living the repercussions of it. Feels needs to work on letting go of trauma that she still feels the effects of. Feels stuck and knows everything goes back there. Need to be heard and get out. Gone through it and never been able to talk about it and internalized. Nobody to talk to and tell this stuff to and process it.  Reviewed preliminary steps for trauma work  next session      Suicidal/Homicidal: No  Therapist Response: Therapist reviewed symptoms, facilitated expression of thoughts and feelings as a way for patient to work through current stressors and anxiety.  Validated patient on how she was feeling encourage patient and her problem solving.  Discussed strategies that would help her sleep including working through her  stressors, therapist shared emotions are physical states if you want a change her motion you have to change her physical state so I may have to be helpful to relax by taking a shower.  Assessed trauma narratives continue to surface and feeling defeated impacting current life therapist worked on challenging that schema explaining how past trauma schemas related to the past and we need to develop an alternate more accurate 1.  Assess difficult for patient to do based on recent disappointing events and entrenched in trauma narrative still.  Discuss initial steps to begin narrative work including patient writing list of memories that she will use to narrate.  Next session will make a memory hierarchy as well as preliminary steps in doing trauma work.  Therapist reviewed doing grief work as part of trauma work and how this will help release emotions that will help her get be able to give more energy to presents situation, more self compassion more ability to release past trauma's and focus more on energy to present in future.  Therapist will send patient notes on grief trauma work for patient to review.  Therapist provided active listening, open questions supportive interventions  Plan: Return again in 1 week.2.  Patient composed memory hierarchy, therapist introduce other aspects to doing trauma work including having in place resources to help the stress if needed, introducing suds scale. 3.  Begin narrative work with patient  Diagnosis: Axis I: major depressive disorder, recurrent, moderate, generalized anxiety disorder, PTSD, adjustment insomnia    Axis II: No diagnosis    [name], LCSW 02/07/2020

## 2020-02-14 ENCOUNTER — Ambulatory Visit (INDEPENDENT_AMBULATORY_CARE_PROVIDER_SITE_OTHER): Payer: Medicare PPO | Admitting: Licensed Clinical Social Worker

## 2020-02-14 DIAGNOSIS — F331 Major depressive disorder, recurrent, moderate: Secondary | ICD-10-CM | POA: Diagnosis not present

## 2020-02-14 DIAGNOSIS — F4310 Post-traumatic stress disorder, unspecified: Secondary | ICD-10-CM | POA: Diagnosis not present

## 2020-02-14 DIAGNOSIS — F411 Generalized anxiety disorder: Secondary | ICD-10-CM
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
## 2020-02-19 ENCOUNTER — Ambulatory Visit (INDEPENDENT_AMBULATORY_CARE_PROVIDER_SITE_OTHER): Payer: Medicare PPO | Admitting: Licensed Clinical Social Worker

## 2020-02-19 DIAGNOSIS — F4310 Post-traumatic stress disorder, unspecified: Secondary | ICD-10-CM

## 2020-02-19 DIAGNOSIS — F5102 Adjustment insomnia: Secondary | ICD-10-CM | POA: Diagnosis not present

## 2020-02-19 DIAGNOSIS — F411 Generalized anxiety disorder: Secondary | ICD-10-CM

## 2020-02-19 DIAGNOSIS — F331 Major depressive disorder, recurrent, moderate: Secondary | ICD-10-CM

## 2020-02-19 NOTE — Progress Notes (Signed)
Virtual Visit via Video Note  I connected with Sandra Bean on 02/19/20 at 11:00 AM EST by a video enabled telemedicine application and verified that I am speaking with the correct person using two identifiers.  Location: Patient: home-alone in session Provider: home office   I discussed the limitations of evaluation and management by telemedicine and the availability of in person appointments. The patient expressed understanding and agreed to proceed.   I discussed the assessment and treatment plan with the patient. The patient was provided an opportunity to ask questions and all were answered. The patient agreed with the plan and demonstrated an understanding of the instructions.   The patient was advised to call back or seek an in-person evaluation if the symptoms worsen or if the condition fails to improve as anticipated.  I provided 57 minutes of non-face-to-face time during this encounter.  THERAPIST PROGRESS NOTE  Session Time: 11:00 AM to 11:57 AM  Participation Level: Active  Behavioral Response: CasualAlertDysphoric  Type of Therapy: Individual Therapy  Treatment Goals addressed:  Continue to work on processing through trauma to let go the past ("not worked on it at the level that I need to) challenge schemas from the past which are distorted and damaging the present (causes feelings of worthless, depression, new issues piling up on top of old issues) decrease trauma symptoms, coping  Interventions: Solution Focused, Strength-based, Supportive and Other: trauma, coping  Summary: Sandra Bean is a 59 y.o. female who presents with after getting off of call last time it was tough, good tough good to share with therapist but if went further would not have had Thanksgiving dinner. It has got to be confronted because still causes a lot of pain. It is a critical part of her depression, of PTSD. It needs to be addressed difficulties for her go from holidays Thanksgiving to June  goes further than June. This is still a very crippling part of her life and has been for years. Had her coping mechanisms and now don't have them and more alone than ever. Timing of starting this right at Thanksgiving and already had issues probably made this harder. Asks the questions Is this harder than continuing to live with this and the answer to that is no. If  finally talking about, grieving it releasing it, if will help pass through it, will help her to do what she needs to do to work though it to do what she needs to do than wants to continue. Therapist reviewed feelings memories and patient shares recognizes this. Her feelings tied to worthless tied to trauma, feeling of not amounting to everything. Reviewed SUDS scale. 7-8 when need to take a break. Her body tells her how she is feeling about things. Movements, rocking, restless leg syndrome, emotions like going to cry, anger is high when she needs to shut down. Shutting down is difficult for her. Sometimes better to go to end because going to go there to end. Mind won't allow her to stop. Has to go through with memory and the pain and keep her up all night long. Decided maybe to take a break at 5 and doesn't need to get to the end of it to a 7-7. Has things crossword puzzles. Creativeness gets her mind off of things. Work on things she is doing. Brain games on the phone. Painting bathroom furniture. Does have outlets and her dog. Her dog is very much a part of her support, he is her emotional support. He keeps  her from being totally alone. Another being in your life realize who is so important.  Patient narrated neutral memory and therapist and patient together reviewed outline for doing narrative work.  Suicidal/Homicidal: No  Therapist Response: Therapist reviewed symptoms, facilitated expression of thoughts and feelings, noted patient having difficulty with begetting narrative work last time and discuss plan moving forward.  Discussed will adjust  work on trauma as needed even incorporating different styles of interventions.  Patient reviewed neutral memory and plan steps of doing narrative were reviewed to plan for future sessions.  Therapist introduced implicit memories help patient understanding when triggered may relate to past memory and not to recent events particularly when response is exaggerated out of context to recent event to help patient with coping.  Therapist provided active listening open questions supportive interventions.  Plan: Return again in 1 weeks.2.Continue narrative work  Diagnosis: Axis I:  major depressive disorder, recurrent, moderate, generalized anxiety disorder, PTSD, adjustment insomnia    Axis II: No diagnosis    [name], LCSW 02/19/2020

## 2020-02-26 ENCOUNTER — Ambulatory Visit (INDEPENDENT_AMBULATORY_CARE_PROVIDER_SITE_OTHER): Payer: Medicare PPO | Admitting: Licensed Clinical Social Worker

## 2020-02-26 DIAGNOSIS — F331 Major depressive disorder, recurrent, moderate: Secondary | ICD-10-CM | POA: Diagnosis not present

## 2020-02-26 DIAGNOSIS — F411 Generalized anxiety disorder: Secondary | ICD-10-CM | POA: Diagnosis not present

## 2020-02-26 DIAGNOSIS — F4310 Post-traumatic stress disorder, unspecified: Secondary | ICD-10-CM

## 2020-02-26 DIAGNOSIS — F5102 Adjustment insomnia: Secondary | ICD-10-CM
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
## 2020-03-01 ENCOUNTER — Ambulatory Visit (INDEPENDENT_AMBULATORY_CARE_PROVIDER_SITE_OTHER): Payer: Medicare PPO

## 2020-03-01 ENCOUNTER — Other Ambulatory Visit: Payer: Self-pay

## 2020-03-01 ENCOUNTER — Ambulatory Visit: Payer: Medicare PPO | Admitting: Podiatry

## 2020-03-01 DIAGNOSIS — M722 Plantar fascial fibromatosis: Secondary | ICD-10-CM | POA: Diagnosis not present

## 2020-03-01 DIAGNOSIS — M778 Other enthesopathies, not elsewhere classified: Secondary | ICD-10-CM

## 2020-03-01 DIAGNOSIS — Q666 Other congenital valgus deformities of feet: Secondary | ICD-10-CM

## 2020-03-01 DIAGNOSIS — M7671 Peroneal tendinitis, right leg: Secondary | ICD-10-CM

## 2020-03-05 ENCOUNTER — Ambulatory Visit (HOSPITAL_COMMUNITY): Payer: Medicare PPO | Admitting: Licensed Clinical Social Worker

## 2020-03-05 ENCOUNTER — Encounter: Payer: Self-pay | Admitting: Podiatry

## 2020-03-05 DIAGNOSIS — M778 Other enthesopathies, not elsewhere classified: Secondary | ICD-10-CM | POA: Diagnosis not present

## 2020-03-05 DIAGNOSIS — M722 Plantar fascial fibromatosis: Secondary | ICD-10-CM | POA: Diagnosis not present

## 2020-03-05 MED ORDER — TRIAMCINOLONE ACETONIDE 10 MG/ML IJ SUSP
10.0000 mg | Freq: Once | INTRAMUSCULAR | Status: AC
  Administered 2020-03-05: 10 mg via INTRA_ARTICULAR

## 2020-03-05 NOTE — Progress Notes (Signed)
Subjective:  Patient ID: Sandra Bean, female    DOB: [birth year],  MRN: [id number]  Chief Complaint  Patient presents with  . Foot Pain    Right foot pain. PT has a knot on the top of her foot    59 y.o. female presents with the above complaint. Patient presents with complaint of right heel pain as well as right lateral foot pain.  Patient states there is some burning sensation on the side of the foot and on the bottom part of the heel.  Patient states that is painful to walk on it started out of nowhere.  The surgical site is doing really well.  She has had a hard time ambulating on it.  She has not received any steroid shot.  She has not seen anyone else prior to seeing me.  She denies any other acute complaints.  She has not tried any other treatment options.   Review of Systems: Negative except as noted in the HPI. Denies N/V/F/Ch.  Past Medical History:  Diagnosis Date  . Anemia   . Anxiety    PTDS  . Arthritis    RIGHT SIDE  . COPD (chronic obstructive pulmonary disease) ([place])   . Depression   . Diabetes mellitus without complication (H[facility])    Type 2  . Fibroids   . Fibromyalgia    DAILY PAIN  . GERD (gastroesophageal reflux disease)   . H/O hernia repair 2009  . Headache   . Hypertension   . Hyperthyroidism    NON PER PATIENT  . Mental disorder   . Mitral valve prolapse   . Ovarian cyst     Current Outpatient Medications:  .  Accu-Chek FastClix Lancets MISC, Check blood sugar once daily., Disp: , Rfl:  .  amLODipine (NORVASC) 5 MG tablet, Take 5 mg by mouth daily. , Disp: , Rfl: 0 .  aspirin EC 325 MG tablet, Take 975 mg by mouth daily as needed for mild pain (headaches)., Disp: , Rfl:  .  atorvastatin (LIPITOR) 20 MG tablet, Take 20 mg by mouth daily. , Disp: , Rfl:  .  atorvastatin (LIPITOR) 20 MG tablet, Take 20 mg by mouth daily., Disp: , Rfl:  .  BLACK CURRANT SEED OIL PO, Take by mouth., Disp: , Rfl:  .  desvenlafaxine (PRISTIQ) 100 MG 24 hr tablet, Take 1  tablet (100 mg total) by mouth daily., Disp: 90 tablet, Rfl: 0 .  diclofenac (VOLTAREN) 75 MG EC tablet, Take 75 mg by mouth 2 (two) times daily., Disp: , Rfl:  .  DULoxetine (CYMBALTA) 30 MG capsule, Take 1 capsule (30 mg total) by mouth daily., Disp: 30 capsule, Rfl: 1 .  ECHINACEA EXTRACT PO, Take by mouth., Disp: , Rfl:  .  ELDERBERRY PO, Take by mouth., Disp: , Rfl:  .  Fe Fum-FePoly-FA-Vit C-Vit B3 (INTEGRA F) 125-1 MG CAPS, Take 1 capsule by mouth daily., Disp: 30 capsule, Rfl: 3 .  ferrous gluconate (FERGON) 324 MG tablet, , Disp: , Rfl:  .  ferrous sulfate 325 (65 FE) MG tablet, Take 1 tablet (325 mg total) by mouth daily for 7 days., Disp: 7 tablet, Rfl: 0 .  Flaxseed, Linseed, (FLAX SEED OIL PO), Take by mouth., Disp: , Rfl:  .  fluticasone (FLONASE) 50 MCG/ACT nasal spray, 1 spray by Both Nostrils route daily., Disp: , Rfl:  .  glucose blood (ACCU-CHEK GUIDE) test strip, Check blood sugar once daily., Disp: , Rfl:  .  HYDROcodone-acetaminophen (NORCO/VICODIN) 5-325 MG  tablet, TK 1 T PO  Q 6 H PRN (Patient not taking: Reported on 12/08/2019), Disp: , Rfl:  .  ibuprofen (ADVIL,MOTRIN) 600 MG tablet, Take 1 tablet (600 mg total) by mouth every 6 (six) hours as needed (mild pain)., Disp: 30 tablet, Rfl: 0 .  JARDIANCE 10 MG TABS tablet, Take 10 mg by mouth daily. , Disp: , Rfl:  .  Lancets (ONETOUCH ULTRASOFT) lancets, , Disp: , Rfl:  .  levofloxacin (LEVAQUIN) 500 MG tablet, TK 1 T PO D FOR 10 DAYS, Disp: , Rfl:  .  linagliptin (TRADJENTA) 5 MG TABS tablet, Take by mouth., Disp: , Rfl:  .  losartan-hydrochlorothiazide (HYZAAR) 100-25 MG tablet, Take 1 tablet by mouth once daily, Disp: , Rfl: 0 .  meloxicam (MOBIC) 15 MG tablet, , Disp: , Rfl:  .  metFORMIN (GLUCOPHAGE) 500 MG tablet, Take 1 tablet (500 mg total) by mouth 2 (two) times daily with a meal. (Patient taking differently: Take 1,000 mg by mouth 2 (two) times daily with a meal. ), Disp: 60 tablet, Rfl: 3 .  methocarbamol  (ROBAXIN) 500 MG tablet, Take 1 tablet (500 mg total) by mouth every 8 (eight) hours as needed., Disp: 15 tablet, Rfl: 0 .  metoprolol tartrate (LOPRESSOR) 50 MG tablet, Take 50 mg by mouth daily. , Disp: , Rfl: 0 .  Multiple Vitamin (MULTIVITAMIN IRON-FREE) TABS, Take 1 tablet by mouth daily., Disp: , Rfl:  .  Multiple Vitamins-Minerals (HAIR SKIN AND NAILS FORMULA) TABS, Take 1 tablet by mouth daily. , Disp: , Rfl:  .  naproxen (NAPROSYN) 500 MG tablet, Take 1 tablet (500 mg total) by mouth 2 (two) times daily., Disp: 10 tablet, Rfl: 0 .  nicotine (NICODERM CQ - DOSED IN MG/24 HOURS) 14 mg/24hr patch, Place onto the skin. (Patient not taking: Reported on 12/08/2019), Disp: , Rfl:  .  Omega-3 Fatty Acids (FISH OIL) 1000 MG CAPS, Take by mouth., Disp: , Rfl:  .  omeprazole (PRILOSEC) 20 MG capsule, Take 20 mg by mouth daily. , Disp: , Rfl:  .  potassium chloride (K-DUR) 10 MEQ tablet, Take 1 tablet (10 mEq total) by mouth daily for 4 days., Disp: 4 tablet, Rfl: 0 .  potassium chloride (MICRO-K) 10 MEQ CR capsule, Take 10 mEq by mouth 2 (two) times daily., Disp: , Rfl:  .  pramipexole (MIRAPEX) 1.5 MG tablet, Take 1 tablet (1.5 mg total) by mouth at bedtime., Disp: 135 tablet, Rfl: 0 .  predniSONE (STERAPRED UNI-PAK 21 TAB) 10 MG (21) TBPK tablet, TK UTD, Disp: , Rfl:  .  sodium chloride (OCEAN) 0.65 % SOLN nasal spray, Place 1 spray into both nostrils daily as needed for congestion. , Disp: , Rfl:  .  traZODone (DESYREL) 100 MG tablet, Take 2 tablets (200 mg total) by mouth at bedtime as needed for sleep., Disp: 60 tablet, Rfl: 2 .  vitamin C (ASCORBIC ACID) 500 MG tablet, Take 500 mg by mouth daily., Disp: , Rfl:   Social History   Tobacco Use  Smoking Status Current Every Day Smoker  . Packs/day: 0.50  . Types: Cigarettes  Smokeless Tobacco Never Used    Allergies  Allergen Reactions  . Famotidine Nausea Only  . Mirtazapine Other (See Comments)    Overly drowsy, couldn't function     Objective:  There were no vitals filed for this visit. There is no height or weight on file to calculate BMI. Constitutional Well developed. Well nourished.  Vascular Dorsalis pedis pulses palpable  bilaterally. Posterior tibial pulses palpable bilaterally. Capillary refill normal to all digits.  No cyanosis or clubbing noted. Pedal hair growth normal.  Neurologic Normal speech. Oriented to person, place, and time. Epicritic sensation to light touch grossly present bilaterally.  Dermatologic Nails well groomed and normal in appearance. No open wounds. No skin lesions.  Orthopedic:  Pain on palpation to the right lateral ankle at the level of the ATFL ligament.  Pain with dorsiflexion eversion of the foot.  Mild pain with plantarflexion inversion of the foot.  No pain at the ankle joint.  No pain at the posterior tibial tendon, peroneal tendon, Achilles tendon  Pain on palpation to the medial calcaneal tuber right.  No pain with lateral calcaneal squeeze test.  Positive Silf[place]kiold gastrocnemius recession noted   Radiographs: 3 views of skeletally mature the right foot: No fractures noted.  Mild midfoot arthritis noted.  No other bony abnormalities noted. Assessment:   1. Plantar fasciitis of right foot   2. Capsulitis of right foot   3. Pes planovalgus    Plan:  Patient was evaluated and treated and all questions answered.  Right ATFL ligament attenuation with underlying capsulitis -I explained to the patient the etiology of capsulitis and various treatment options were discussed.  In the past patient has had a chronic ankle sprain with instability that could have led to pain at the ATFL ligament.  The amount of pain that is she is having I believe she will benefit from a steroid injection to help decrease acute inflammatory component associated pain.  Patient agrees with plan to proceed with injection -A steroid injection was performed at Right lateral foot a point of maximal  tenderness using 1% plain Lidocaine and 10 mg of Kenalog. This was well tolerated.   Plantar Fasciitis, right with underlying mild pes planovalgus - XR reviewed as above.  - Educated on icing and stretching. Instructions given.  - Injection delivered to the plantar fascia as below. - DME: None - Pharmacologic management: None  Procedure: Injection Tendon/Ligament Location: Right plantar fascia at the glabrous junction; medial approach. Skin Prep: alcohol Injectate: 0.5 cc 0.5% marcaine plain, 0.5 cc of 1% Lidocaine, 0.5 cc kenalog 10. Disposition: Patient tolerated procedure well. Injection site dressed with a band-aid.  No follow-ups on file.   No follow-ups on file.

## 2020-03-12 ENCOUNTER — Ambulatory Visit (INDEPENDENT_AMBULATORY_CARE_PROVIDER_SITE_OTHER): Payer: Medicare PPO | Admitting: Licensed Clinical Social Worker

## 2020-03-12 DIAGNOSIS — F331 Major depressive disorder, recurrent, moderate: Secondary | ICD-10-CM

## 2020-03-12 DIAGNOSIS — F5102 Adjustment insomnia: Secondary | ICD-10-CM | POA: Diagnosis not present

## 2020-03-12 DIAGNOSIS — F411 Generalized anxiety disorder: Secondary | ICD-10-CM | POA: Diagnosis not present

## 2020-03-12 DIAGNOSIS — F4310 Post-traumatic stress disorder, unspecified: Secondary | ICD-10-CM | POA: Diagnosis not present
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
## 2020-03-29 ENCOUNTER — Other Ambulatory Visit: Payer: Self-pay

## 2020-03-29 ENCOUNTER — Ambulatory Visit: Payer: Medicare PPO | Admitting: Podiatry

## 2020-03-29 DIAGNOSIS — M722 Plantar fascial fibromatosis: Secondary | ICD-10-CM

## 2020-03-29 DIAGNOSIS — M778 Other enthesopathies, not elsewhere classified: Secondary | ICD-10-CM

## 2020-04-01 ENCOUNTER — Encounter (HOSPITAL_COMMUNITY): Payer: Self-pay | Admitting: Psychiatry

## 2020-04-01 ENCOUNTER — Telehealth (INDEPENDENT_AMBULATORY_CARE_PROVIDER_SITE_OTHER): Payer: Medicare Other | Admitting: Psychiatry

## 2020-04-01 DIAGNOSIS — F331 Major depressive disorder, recurrent, moderate: Secondary | ICD-10-CM

## 2020-04-01 DIAGNOSIS — F4310 Post-traumatic stress disorder, unspecified: Secondary | ICD-10-CM | POA: Diagnosis not present

## 2020-04-01 MED ORDER — TRAZODONE HCL 100 MG PO TABS: 200.0000 mg | ORAL_TABLET | Freq: Every evening | ORAL | 2 refills | Status: DC | PRN

## 2020-04-01 MED ORDER — DESVENLAFAXINE SUCCINATE ER 100 MG PO TB24: 100.0000 mg | ORAL_TABLET | Freq: Every day | ORAL | 0 refills | Status: DC

## 2020-04-01 MED ORDER — DULOXETINE HCL 30 MG PO CPEP: 30.0000 mg | ORAL_CAPSULE | Freq: Every day | ORAL | 0 refills | Status: DC
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
## 2020-04-01 NOTE — Addendum Note (Signed)
Addended by: Merian Capron on: 04/01/2020 04:41 PM   Modules accepted: Orders

## 2020-04-02 ENCOUNTER — Ambulatory Visit (INDEPENDENT_AMBULATORY_CARE_PROVIDER_SITE_OTHER): Payer: Medicare Other | Admitting: Licensed Clinical Social Worker

## 2020-04-02 DIAGNOSIS — F5102 Adjustment insomnia: Secondary | ICD-10-CM | POA: Diagnosis not present

## 2020-04-02 DIAGNOSIS — F411 Generalized anxiety disorder: Secondary | ICD-10-CM

## 2020-04-02 DIAGNOSIS — F331 Major depressive disorder, recurrent, moderate: Secondary | ICD-10-CM

## 2020-04-02 DIAGNOSIS — F4310 Post-traumatic stress disorder, unspecified: Secondary | ICD-10-CM | POA: Diagnosis not present

## 2020-04-02 NOTE — Progress Notes (Signed)
Virtual Visit via Video Note  I connected with Sandra Bean on 04/02/20 at 11:00 AM EST by a video enabled telemedicine application and verified that I am speaking with the correct person using two identifiers.  Location: Patient: home Provider: home offfice   I discussed the limitations of evaluation and management by telemedicine and the availability of in person appointments. The patient expressed understanding and agreed to proceed.  I discussed the assessment and treatment plan with the patient. The patient was provided an opportunity to ask questions and all were answered. The patient agreed with the plan and demonstrated an understanding of the instructions.   The patient was advised to call back or seek an in-person evaluation if the symptoms worsen or if the condition fails to improve as anticipated.  I provided 60 minutes of non-face-to-face time during this encounter.  THERAPIST PROGRESS NOTE  Session Time: 11:00 AM to 12:00 PM  Participation Level: Active  Behavioral Response: CasualAlertEuthymic  Type of Therapy: Individual Therapy  Treatment Goals addressed:  Continue to work on processing through trauma to let go the past ("not worked on it at the level that I need to) challenge schemas from the past which are distorted and damaging the present (causes feelings of worthless, depression, new issues piling up on top of old issues) decrease trauma  Interventions: Solution Focused, Strength-based, Supportive and Other: trauma, coping  Summary: Sandra Bean is a 60 y.o. female who presents with doing well. She wrote a list of letter she needs to write. A couple of instances of where forgiveness came up. Came to realization that trauma holding her back. We tried with using [employer]) and not getting anywhere. Christmas Eve went to service been doing that since Thanksgiving. Doristine Bosworth said have to forgive and resolve with past issues in order for it to release you  and get the forgiveness of God. "Torn through" her because that is where she is at. Picked up the phone with brother and told him that she forgive him and felt release, patient describes that "she is hindering herself." Wrote a list of people need to forgive. Asked paster how to do that with people not here? Write the letter anyway the point is to forgive them. Wrote letter to friend. So releasing for patient don't think about her, no regrets and treasure the good memories. Therapist discussed with patient process that could take place with therapy and would help with the process. Patient talk about the trauma and then write the letter. Has to relive them. Want to speak about them. One thing about trauma was reliving, telling the story and reliving. The end of it was the forgiveness let go of resentment and in turn free her. Reliving, telling, sharing, listening, crying, getting the pain out. Things never spoken. Only severe effect has been on patient. Need to say it. When writing the letter and thinking about experiences bottom line was never the best friend, to patient's disappointment and pain. Saw hurt and pain build to resentment and didn't forgive and now can forgive. So many instances was not friend and could have walked away and prevent the pain with friend Alyse Low. Move on to be hurt again and again. Ignored and neglected making things worse when not feeling good about herself. Release pain, hurt doesn't exist anymore. Won't happen again won't have a relationship with her.  Patient will share in therapy; most painful aspects, what that felt like, why cause pain, why cause resentment. Forgiveness releases. Holding  in for herself and nobody knows. Deep seated that have to speak it has such a hold on her. Throwing it up speaking your truth, releasing the pain. Resolve it with forgiveness can be set free. For example things that happened with friend Alyse Low caused mistrust and resentment can't live a positive  life with. Can't move forward if holding on to it. 15 years not living, not starting new friendships what holding on to. Tricking down all this stuff so not living. Did a lot of recoding she thinks when younger, put aside, never let go of though, when young distractions and energy. Cope with it and move forward. When not resolved past trauma they have opportunity to come back "and bite your ass". Never remember a time go to bet and peaceful, mind racing about all this stuff. Doesn't want that anymore.  Don't want to prevent from joy and happiness. Has done this with one trauma and hopefully the other traumas, if nothing left, nothing holding her back. Spiritually, emotionally, physically held her back. Therapist reviewed process with patient in upcoming sessions, reliving, forgiving will be release physical, spiritually and mentally. After going through a person to write a letter. Therapist reviewed her part in process gauge how patient is feeling, think about how it would feel to be in patient's situation engaging feeling  summerize feelings and how feeling got to get out.  Therapist reviewed this in terms of theoretic therapy for trauma this being a way to resolve memory, processed through feelings and thoughts that get unresolved with trauma.  Additionally helping to change mental schema that was created through trauma.  Therapist processed patient's feelings helping her work through them as well as active listening open questions supportive interventions      Suicidal/Homicidal: No  Plan: Return again in 1 weeks.2. Continue to work on trauma processing, coping Diagnosis: Axis I:  major depressive disorder, recurrent, moderate, generalized anxiety disorder, PTSD, adjustment insomnia    Axis II: No diagnosis    [name], LCSW 04/02/2020

## 2020-04-03 ENCOUNTER — Encounter: Payer: Self-pay | Admitting: Podiatry

## 2020-04-03 NOTE — Progress Notes (Signed)
Subjective:  Patient ID: Sandra Bean, female    DOB: 1960/06/30,  MRN: [id number]  Chief Complaint  Patient presents with  . Foot Pain    Pt stated that she is doing great she has no major concerns at this time.    60 y.o. female presents with the above complaint.  Patient presents with the right plantar fasciitis is right lateral ATFL ligament sprain.  Patient states she is doing a lot better.  The injection helps a lot.  She does not have any major concern at this time.  She is ambulating back to regular sneaker with orthotics.   Review of Systems: Negative except as noted in the HPI. Denies N/V/F/Ch.  Past Medical History:  Diagnosis Date  . Anemia   . Anxiety    PTDS  . Arthritis    RIGHT SIDE  . COPD (chronic obstructive pulmonary disease) ([place])   . Depression   . Diabetes mellitus without complication (HCC)    Type 2  . Fibroids   . Fibromyalgia    DAILY PAIN  . GERD (gastroesophageal reflux disease)   . H/O hernia repair 2009  . Headache   . Hypertension   . Hyperthyroidism    NON PER PATIENT  . Mental disorder   . Mitral valve prolapse   . Ovarian cyst     Current Outpatient Medications:  .  Accu-Chek FastClix Lancets MISC, Check blood sugar once daily., Disp: , Rfl:  .  amLODipine (NORVASC) 5 MG tablet, Take 5 mg by mouth daily. , Disp: , Rfl: 0 .  aspirin EC 325 MG tablet, Take 975 mg by mouth daily as needed for mild pain (headaches)., Disp: , Rfl:  .  atorvastatin (LIPITOR) 20 MG tablet, Take 20 mg by mouth daily. , Disp: , Rfl:  .  atorvastatin (LIPITOR) 20 MG tablet, Take 20 mg by mouth daily., Disp: , Rfl:  .  BLACK CURRANT SEED OIL PO, Take by mouth., Disp: , Rfl:  .  desvenlafaxine (PRISTIQ) 100 MG 24 hr tablet, Take 1 tablet (100 mg total) by mouth daily., Disp: 90 tablet, Rfl: 0 .  diclofenac (VOLTAREN) 75 MG EC tablet, Take 75 mg by mouth 2 (two) times daily., Disp: , Rfl:  .  DULoxetine (CYMBALTA) 30 MG capsule, Take 1 capsule (30 mg total) by  mouth daily., Disp: 90 capsule, Rfl: 0 .  ECHINACEA EXTRACT PO, Take by mouth., Disp: , Rfl:  .  ELDERBERRY PO, Take by mouth., Disp: , Rfl:  .  Fe Fum-FePoly-FA-Vit C-Vit B3 (INTEGRA F) 125-1 MG CAPS, Take 1 capsule by mouth daily., Disp: 30 capsule, Rfl: 3 .  ferrous gluconate (FERGON) 324 MG tablet, , Disp: , Rfl:  .  ferrous sulfate 325 (65 FE) MG tablet, Take 1 tablet (325 mg total) by mouth daily for 7 days., Disp: 7 tablet, Rfl: 0 .  Flaxseed, Linseed, (FLAX SEED OIL PO), Take by mouth., Disp: , Rfl:  .  fluticasone (FLONASE) 50 MCG/ACT nasal spray, 1 spray by Both Nostrils route daily., Disp: , Rfl:  .  glucose blood (ACCU-CHEK GUIDE) test strip, Check blood sugar once daily., Disp: , Rfl:  .  HYDROcodone-acetaminophen (NORCO/VICODIN) 5-325 MG tablet, TK 1 T PO  Q 6 H PRN (Patient not taking: Reported on 12/08/2019), Disp: , Rfl:  .  ibuprofen (ADVIL,MOTRIN) 600 MG tablet, Take 1 tablet (600 mg total) by mouth every 6 (six) hours as needed (mild pain)., Disp: 30 tablet, Rfl: 0 .  JARDIANCE 10  MG TABS tablet, Take 10 mg by mouth daily. , Disp: , Rfl:  .  Lancets (ONETOUCH ULTRASOFT) lancets, , Disp: , Rfl:  .  levofloxacin (LEVAQUIN) 500 MG tablet, TK 1 T PO D FOR 10 DAYS, Disp: , Rfl:  .  linagliptin (TRADJENTA) 5 MG TABS tablet, Take by mouth., Disp: , Rfl:  .  losartan-hydrochlorothiazide (HYZAAR) 100-25 MG tablet, Take 1 tablet by mouth once daily, Disp: , Rfl: 0 .  meloxicam (MOBIC) 15 MG tablet, , Disp: , Rfl:  .  metFORMIN (GLUCOPHAGE) 500 MG tablet, Take 1 tablet (500 mg total) by mouth 2 (two) times daily with a meal. (Patient taking differently: Take 1,000 mg by mouth 2 (two) times daily with a meal. ), Disp: 60 tablet, Rfl: 3 .  methocarbamol (ROBAXIN) 500 MG tablet, Take 1 tablet (500 mg total) by mouth every 8 (eight) hours as needed., Disp: 15 tablet, Rfl: 0 .  metoprolol tartrate (LOPRESSOR) 50 MG tablet, Take 50 mg by mouth daily. , Disp: , Rfl: 0 .  Multiple Vitamin  (MULTIVITAMIN IRON-FREE) TABS, Take 1 tablet by mouth daily., Disp: , Rfl:  .  Multiple Vitamins-Minerals (HAIR SKIN AND NAILS FORMULA) TABS, Take 1 tablet by mouth daily. , Disp: , Rfl:  .  naproxen (NAPROSYN) 500 MG tablet, Take 1 tablet (500 mg total) by mouth 2 (two) times daily., Disp: 10 tablet, Rfl: 0 .  nicotine (NICODERM CQ - DOSED IN MG/24 HOURS) 14 mg/24hr patch, Place onto the skin. (Patient not taking: Reported on 12/08/2019), Disp: , Rfl:  .  Omega-3 Fatty Acids (FISH OIL) 1000 MG CAPS, Take by mouth., Disp: , Rfl:  .  omeprazole (PRILOSEC) 20 MG capsule, Take 20 mg by mouth daily. , Disp: , Rfl:  .  potassium chloride (K-DUR) 10 MEQ tablet, Take 1 tablet (10 mEq total) by mouth daily for 4 days., Disp: 4 tablet, Rfl: 0 .  potassium chloride (MICRO-K) 10 MEQ CR capsule, Take 10 mEq by mouth 2 (two) times daily., Disp: , Rfl:  .  pramipexole (MIRAPEX) 1.5 MG tablet, Take 1 tablet (1.5 mg total) by mouth at bedtime., Disp: 135 tablet, Rfl: 0 .  predniSONE (STERAPRED UNI-PAK 21 TAB) 10 MG (21) TBPK tablet, TK UTD, Disp: , Rfl:  .  sodium chloride (OCEAN) 0.65 % SOLN nasal spray, Place 1 spray into both nostrils daily as needed for congestion. , Disp: , Rfl:  .  traZODone (DESYREL) 100 MG tablet, Take 2 tablets (200 mg total) by mouth at bedtime as needed for sleep., Disp: 60 tablet, Rfl: 2 .  vitamin C (ASCORBIC ACID) 500 MG tablet, Take 500 mg by mouth daily., Disp: , Rfl:   Social History   Tobacco Use  Smoking Status Current Every Day Smoker  . Packs/day: 0.50  . Types: Cigarettes  Smokeless Tobacco Never Used    Allergies  Allergen Reactions  . Famotidine Nausea Only  . Mirtazapine Other (See Comments)    Overly drowsy, couldn't function    Objective:  There were no vitals filed for this visit. There is no height or weight on file to calculate BMI. Constitutional Well developed. Well nourished.  Vascular Dorsalis pedis pulses palpable bilaterally. Posterior tibial  pulses palpable bilaterally. Capillary refill normal to all digits.  No cyanosis or clubbing noted. Pedal hair growth normal.  Neurologic Normal speech. Oriented to person, place, and time. Epicritic sensation to light touch grossly present bilaterally.  Dermatologic Nails well groomed and normal in appearance. No open wounds.  No skin lesions.  Orthopedic:  Now pain on palpation to the right lateral ankle at the level of the ATFL ligament.  Pain with dorsiflexion eversion of the foot.  Mild pain with plantarflexion inversion of the foot.  No pain at the ankle joint.  No pain at the posterior tibial tendon, peroneal tendon, Achilles tendon  Now pain on palpation to the medial calcaneal tuber right.  No pain with lateral calcaneal squeeze test.  Positive Silfverskiold gastrocnemius recession noted   Radiographs: 3 views of skeletally mature the right foot: No fractures noted.  Mild midfoot arthritis noted.  No other bony abnormalities noted. Assessment:   1. Plantar fasciitis of right foot   2. Capsulitis of right foot    Plan:  Patient was evaluated and treated and all questions answered.  Right ATFL ligament attenuation with underlying capsulitis -Clinically resolved with a steroid injection.  Patient no longer experiences any pain.   Plantar Fasciitis, right with underlying mild pes planovalgus - Clinically healed with a steroid injection.  At this time continue wearing orthotics without any reservation and return back to regular activities.  Patient states understanding No follow-ups on file.   No follow-ups on file.

## 2020-04-09 ENCOUNTER — Telehealth (HOSPITAL_COMMUNITY): Payer: Self-pay | Admitting: Psychiatry

## 2020-04-09 NOTE — Telephone Encounter (Signed)
[facility] [employer] needs to verbally hear from Dr. De Nurse or our nurse that it is ok to fill the rx that he electronically sent.  (per the patient) They do not need a prior auth, just an ok to fill.   Please call them at [phone] Order number [phone] Its for pristiq, cymbalta, and trazadone.

## 2020-04-09 NOTE — Telephone Encounter (Signed)
Medication management - Telephone call with Otila Kluver, pharmacist with [facility] who wanted to verify patient's most recent orders from Dr. De Nurse to make sure patient is to be taking Pristiq, Cymbalta and Trazodone.  Verified orders from record review of patient's appointment with Dr. De Nurse on 04/01/20 and his new orders e-scribed that date to [facility].  [facility] stated plan to send out refills now that order was verified as e-scribed to them.

## 2020-04-10 ENCOUNTER — Ambulatory Visit (INDEPENDENT_AMBULATORY_CARE_PROVIDER_SITE_OTHER): Payer: Medicare Other | Admitting: Licensed Clinical Social Worker

## 2020-04-10 DIAGNOSIS — F411 Generalized anxiety disorder: Secondary | ICD-10-CM | POA: Diagnosis not present

## 2020-04-10 DIAGNOSIS — F5102 Adjustment insomnia: Secondary | ICD-10-CM

## 2020-04-10 DIAGNOSIS — F4310 Post-traumatic stress disorder, unspecified: Secondary | ICD-10-CM

## 2020-04-10 DIAGNOSIS — F331 Major depressive disorder, recurrent, moderate: Secondary | ICD-10-CM
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
## 2020-04-16 ENCOUNTER — Ambulatory Visit (INDEPENDENT_AMBULATORY_CARE_PROVIDER_SITE_OTHER): Payer: Medicare Other | Admitting: Licensed Clinical Social Worker

## 2020-04-16 DIAGNOSIS — F4310 Post-traumatic stress disorder, unspecified: Secondary | ICD-10-CM

## 2020-04-16 DIAGNOSIS — F411 Generalized anxiety disorder: Secondary | ICD-10-CM | POA: Diagnosis not present

## 2020-04-16 DIAGNOSIS — F331 Major depressive disorder, recurrent, moderate: Secondary | ICD-10-CM | POA: Diagnosis not present

## 2020-04-16 DIAGNOSIS — F5102 Adjustment insomnia: Secondary | ICD-10-CM

## 2020-04-16 NOTE — Progress Notes (Signed)
Virtual Visit via Video Note  I connected with Sandra Bean on 04/16/20 at  8:00 AM EST by a video enabled telemedicine application and verified that I am speaking with the correct person using two identifiers.  Location: Patient: home Provider: home office   I discussed the limitations of evaluation and management by telemedicine and the availability of in person appointments. The patient expressed understanding and agreed to proceed.   I discussed the assessment and treatment plan with the patient. The patient was provided an opportunity to ask questions and all were answered. The patient agreed with the plan and demonstrated an understanding of the instructions.   The patient was advised to call back or seek an in-person evaluation if the symptoms worsen or if the condition fails to improve as anticipated.  I provided 53 minutes of non-face-to-face time during this encounter.   THERAPIST PROGRESS NOTE  Session Time: 8:00 AM to 8:53 AM  Participation Level: Active  Behavioral Response: CasualAlertappropriate  Type of Therapy: Individual Therapy  Treatment Goals addressed:  Continue to work on processing through trauma to let go the past ("not worked on it at the level that I need to) challenge schemas from the past which are distorted and damaging the present (causes feelings of worthless, depression, new issues piling up on top of old issues) decrease trauma Interventions: Solution Focused, Strength-based, Supportive and Other: coping  Summary: Sandra Bean is a 60 y.o. female who presents with describing her daily devotions take her 20 minutes hold her accountable made her think. Spending time with God and learning who he is. More references the better it is for her to understand. Break up in morning and evening. Learning about God for herself not based on going to church when younger. Shares to therapist only way changes your life when you spend time with God. Patient shared  about her spiritual practices that are significant for her and coping. She explains that the relationship with God comes from your own relationship you build and the only way to get to know God is to spend time with God. Wants people to talk about him, to change our life, live a righteous life. More than going to church it is about patient getting to know God for herself. Need to develop a relationship with God is to spend time with him and that is on a daily basis. Spend time with God at the end of the day that is where devotions come in. At the end of the day read the story and spend time understanding the story strengthens the relationship. Find it is very helpful. Part of this will help patient with mental health issues. Helps with loneliness, depression. Reason for this is that she has been stuck for years. Where idea of forgiveness comes from. "All comes together." If you want forgiveness then you have to forgive. Carrying around resentment and anger is doing nothing for her life. If God forgives her then how can she not forgive them for what they have done. Forgive them and the resentment goes away. Very clear that she has held herself back because she can't forgive them. Basically needs to forgive them especially if she wants God to forgive her. Put in practice with one person in her life and doesn't haven't have any resentment anymore. As read more see God gives her direction.  At end of session both patient and therapist felt session was very inspiring and uplifting to discuss spirituality in one's life with a  helpful role it plays. Therapist reviewed symptoms, facilitated expression of thoughts and feelings and noted daily spiritual practices as significant in helping patient gaining insight,, direction helping with coping.  Therapist connected this to managing mental health and involves a daily program to maintain wellbeing and health so spiritual practices fall into line with the practice to maintain  wellbeing and mental health.  Therapist provided positive feedback for both patient's investment into spirituality as well as sharing some suggestions from her practice with therapist.  Therapist related it allows that time learning about God developing a relationship with God helps and insight and perspective very much along the same goals of therapy and helping alleviate symptoms and grow.  Noted to connection with trauma at [facility] for patient working on trauma symptoms therapist very much agreeing with perspective of helping her through trauma looks toward forgiveness as a way to work through trauma symptoms so very pivotal in helping patient move forward in her life by aligning herself with spiritual practice and growing in her spirituality.  Therapist provided active listening open questions supportive interventions.     Suicidal/Homicidal: No  Plan: Return again in 2 weeks.2.  Continue to work on trauma, therapist provide supportive interventions, work on effective coping skills  Diagnosis: Axis I:  major depressive disorder, recurrent, moderate, generalized anxiety disorder, PTSD, adjustment insomnia    Axis II: No diagnosis    [name], LCSW 04/16/2020

## 2020-05-01 ENCOUNTER — Ambulatory Visit (HOSPITAL_COMMUNITY): Payer: Medicare PPO | Admitting: Licensed Clinical Social Worker

## 2020-05-08 ENCOUNTER — Ambulatory Visit (HOSPITAL_COMMUNITY): Payer: Medicare Other | Admitting: Licensed Clinical Social Worker

## 2020-05-17 ENCOUNTER — Ambulatory Visit (HOSPITAL_COMMUNITY): Payer: Medicare Other | Admitting: Licensed Clinical Social Worker

## 2020-05-22 ENCOUNTER — Ambulatory Visit (HOSPITAL_COMMUNITY): Payer: Medicare Other | Admitting: Licensed Clinical Social Worker

## 2020-05-31 ENCOUNTER — Ambulatory Visit: Payer: Medicare PPO | Admitting: Podiatry

## 2020-06-06 ENCOUNTER — Other Ambulatory Visit (HOSPITAL_COMMUNITY): Payer: Self-pay | Admitting: Psychiatry

## 2020-06-26 ENCOUNTER — Ambulatory Visit: Payer: Medicare Other | Admitting: Podiatry

## 2020-06-26 ENCOUNTER — Other Ambulatory Visit: Payer: Self-pay

## 2020-06-26 DIAGNOSIS — M659 Synovitis and tenosynovitis, unspecified: Secondary | ICD-10-CM | POA: Diagnosis not present

## 2020-06-26 MED ORDER — METHYLPREDNISOLONE 4 MG PO TBPK: ORAL_TABLET | ORAL | 0 refills | Status: DC

## 2020-06-26 MED ORDER — BETAMETHASONE SOD PHOS & ACET 6 (3-3) MG/ML IJ SUSP
3.0000 mg | Freq: Once | INTRAMUSCULAR | Status: AC
  Administered 2020-06-26: 3 mg via INTRA_ARTICULAR

## 2020-06-26 NOTE — Progress Notes (Signed)
   Subjective:  60 y.o. female presenting today for follow-up evaluation of right ankle pain is been going on for approximately 1 year now.  She has a history of posterior tibial tendon repair to the medial aspect of the ankle October 2020.  Patient states that for the past year she has been dealing with lateral ankle pain.  She is received injections which only helped temporarily.  She denies a history of injury.  She is only taking Tylenol for the pain.  She presents for further treatment evaluation   Past Medical History:  Diagnosis Date  . Anemia   . Anxiety    PTDS  . Arthritis    RIGHT SIDE  . COPD (chronic obstructive pulmonary disease) ([place])   . Depression   . Diabetes mellitus without complication (HCC)    Type 2  . Fibroids   . Fibromyalgia    DAILY PAIN  . GERD (gastroesophageal reflux disease)   . H/O hernia repair 2009  . Headache   . Hypertension   . Hyperthyroidism    NON PER PATIENT  . Mental disorder   . Mitral valve prolapse   . Ovarian cyst      Objective / Physical Exam:  General:  The patient is alert and oriented x3 in no acute distress. Dermatology:  Skin is warm, dry and supple bilateral lower extremities. Negative for open lesions or macerations. Vascular:  Palpable pedal pulses bilaterally. No edema or erythema noted. Capillary refill within normal limits. Neurological:  Epicritic and protective threshold grossly intact bilaterally.  Musculoskeletal Exam:  Pain on palpation to the lateral aspect of the patient's right ankle. Mild edema noted. Range of motion within normal limits to all pedal and ankle joints bilateral. Muscle strength 5/5 in all groups bilateral.    Assessment: 1.  Synovitis right ankle  Plan of Care:  1. Patient was evaluated.  2. Injection of 0.5 mL Celestone Soluspan injected in the patient's right ankle. 3.  Prescription for Medrol Dosepak 4.  The patient has now had right ankle pain for approximately 1 year now.   Despite conservative treatment modalities there is been no improvement.  Order placed for an MRI right ankle without contrast 5.  Return to clinic in 4 weeks with Dr. Posey Pronto review to review results and discuss further treatment options   Edrick Kins, DPM [facility]  Dr. Edrick Kins, DPM    [address], [place] [address]                Office [phone]  Fax [phone]

## 2020-07-01 ENCOUNTER — Telehealth (HOSPITAL_COMMUNITY): Payer: Medicare Other | Admitting: Psychiatry

## 2020-07-02 ENCOUNTER — Telehealth (INDEPENDENT_AMBULATORY_CARE_PROVIDER_SITE_OTHER): Payer: Medicare Other | Admitting: Psychiatry

## 2020-07-02 DIAGNOSIS — F331 Major depressive disorder, recurrent, moderate: Secondary | ICD-10-CM | POA: Diagnosis not present

## 2020-07-02 DIAGNOSIS — F4310 Post-traumatic stress disorder, unspecified: Secondary | ICD-10-CM

## 2020-07-02 DIAGNOSIS — F411 Generalized anxiety disorder: Secondary | ICD-10-CM

## 2020-07-02 MED ORDER — TRAZODONE HCL 100 MG PO TABS: 200.0000 mg | ORAL_TABLET | Freq: Every evening | ORAL | 2 refills | Status: DC | PRN
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
## 2020-07-24 ENCOUNTER — Ambulatory Visit: Payer: Medicare Other | Admitting: Podiatry

## 2020-07-29 ENCOUNTER — Other Ambulatory Visit: Payer: Medicare Other

## 2020-07-31 ENCOUNTER — Ambulatory Visit: Payer: Medicare Other | Admitting: Podiatry

## 2020-08-26 ENCOUNTER — Other Ambulatory Visit (HOSPITAL_COMMUNITY): Payer: Self-pay | Admitting: Psychiatry

## 2020-09-16 ENCOUNTER — Other Ambulatory Visit (HOSPITAL_COMMUNITY): Payer: Self-pay | Admitting: Psychiatry

## 2020-10-02 ENCOUNTER — Other Ambulatory Visit: Payer: Medicare Other

## 2020-10-06 ENCOUNTER — Other Ambulatory Visit (HOSPITAL_COMMUNITY): Payer: Self-pay | Admitting: Psychiatry

## 2020-10-07 ENCOUNTER — Telehealth: Payer: Self-pay | Admitting: General Practice

## 2020-10-07 NOTE — Telephone Encounter (Signed)
Left message on VM for patient to contact our office to schedule Annual Exam with Dr. Ihor Dow in September 2022.

## 2020-10-09 ENCOUNTER — Ambulatory Visit: Payer: Medicare Other | Admitting: Podiatry

## 2020-10-10 ENCOUNTER — Other Ambulatory Visit: Payer: Self-pay

## 2020-10-10 ENCOUNTER — Ambulatory Visit: Payer: Medicare Other | Admitting: Podiatry

## 2020-10-10 DIAGNOSIS — M659 Synovitis and tenosynovitis, unspecified: Secondary | ICD-10-CM | POA: Diagnosis not present

## 2020-10-10 DIAGNOSIS — M899 Disorder of bone, unspecified: Secondary | ICD-10-CM

## 2020-10-10 DIAGNOSIS — M949 Disorder of cartilage, unspecified: Secondary | ICD-10-CM

## 2020-10-10 NOTE — Progress Notes (Signed)
   Subjective:  60 y.o. female presenting today for follow-up evaluation of right ankle pain is been going on for approximately 1 year now.  She states the injections have been helping and does give her temporary relief however she still continues to have pain.  She had an MRI ordered by Dr. Amalia Hailey.  However because of work schedule and she was not able to schedule it.  She states that she will try to schedule it again.  She denies any other acute complaints.   Past Medical History:  Diagnosis Date   Anemia    Anxiety    PTDS   Arthritis    RIGHT SIDE   COPD (chronic obstructive pulmonary disease) (HCC)    Depression    Diabetes mellitus without complication (HCC)    Type 2   Fibroids    Fibromyalgia    DAILY PAIN   GERD (gastroesophageal reflux disease)    H/O hernia repair 2009   Headache    Hypertension    Hyperthyroidism    NON PER PATIENT   Mental disorder    Mitral valve prolapse    Ovarian cyst      Objective / Physical Exam:  General:  The patient is alert and oriented x3 in no acute distress. Dermatology:  Skin is warm, dry and supple bilateral lower extremities. Negative for open lesions or macerations. Vascular:  Palpable pedal pulses bilaterally. No edema or erythema noted. Capillary refill within normal limits. Neurological:  Epicritic and protective threshold grossly intact bilaterally.  Musculoskeletal Exam:  Pain on palpation to the lateral aspect of the patient's right ankle. Mild edema noted. Range of motion within normal limits to all pedal and ankle joints bilateral. Muscle strength 5/5 in all groups bilateral.    Assessment: 1.  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 Patient was evaluated.  2. Injection of 0.5 mL Kenalog 10 injected in the patient's right ankle. 3.  Prescription for Medrol Dosepak 4.  I encouraged patient to obtain an MRI to assess the flow of the ankle.  Patient states understand we will obtain the  MRI 5.  Return to clinic in 4 weeks with Dr. Posey Pronto review to review results and discuss further treatment options  Boneta Lucks, D.P.M.

## 2020-10-14 ENCOUNTER — Other Ambulatory Visit (HOSPITAL_COMMUNITY): Payer: Self-pay | Admitting: Psychiatry

## 2020-10-16 ENCOUNTER — Ambulatory Visit: Payer: Medicare Other | Admitting: Podiatry

## 2020-10-25 ENCOUNTER — Telehealth: Payer: Self-pay | Admitting: General Practice

## 2020-10-25 NOTE — Telephone Encounter (Signed)
Left message on VM for pt to contact our office to reschedule Annual appt d/t her last Annual was 12/08/2019 with Dr. Ihor Dow.

## 2020-10-29 ENCOUNTER — Telehealth: Payer: Self-pay | Admitting: General Practice

## 2020-10-29 NOTE — Telephone Encounter (Signed)
Left message on VM informing patient to contact our office to reschedule appt.  Pt is scheduled on 11/27/2020 for Annual Exam, but will need to reschedule d/t last annual was 12/08/2019.

## 2020-11-04 ENCOUNTER — Telehealth (INDEPENDENT_AMBULATORY_CARE_PROVIDER_SITE_OTHER): Payer: Medicare Other | Admitting: Psychiatry

## 2020-11-04 ENCOUNTER — Encounter (HOSPITAL_COMMUNITY): Payer: Self-pay | Admitting: Psychiatry

## 2020-11-04 DIAGNOSIS — F4310 Post-traumatic stress disorder, unspecified: Secondary | ICD-10-CM

## 2020-11-04 DIAGNOSIS — F411 Generalized anxiety disorder: Secondary | ICD-10-CM | POA: Diagnosis not present

## 2020-11-04 DIAGNOSIS — F331 Major depressive disorder, recurrent, moderate: Secondary | ICD-10-CM

## 2020-11-04 MED ORDER — DULOXETINE HCL 30 MG PO CPEP: 30.0000 mg | ORAL_CAPSULE | Freq: Every day | ORAL | 0 refills | Status: DC

## 2020-11-04 MED ORDER — DESVENLAFAXINE SUCCINATE ER 100 MG PO TB24: 100.0000 mg | ORAL_TABLET | Freq: Every day | ORAL | 0 refills | Status: DC
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
## 2020-11-07 ENCOUNTER — Ambulatory Visit: Payer: Medicare Other | Admitting: Podiatry

## 2020-11-11 ENCOUNTER — Other Ambulatory Visit (HOSPITAL_COMMUNITY): Payer: Self-pay | Admitting: Psychiatry

## 2020-11-27 ENCOUNTER — Ambulatory Visit: Payer: Medicare Other | Admitting: Obstetrics & Gynecology

## 2020-12-10 ENCOUNTER — Other Ambulatory Visit (HOSPITAL_COMMUNITY): Payer: Self-pay | Admitting: Psychiatry

## 2020-12-11 ENCOUNTER — Ambulatory Visit: Payer: Medicare Other | Admitting: Obstetrics & Gynecology

## 2020-12-29 ENCOUNTER — Other Ambulatory Visit (HOSPITAL_COMMUNITY): Payer: Self-pay | Admitting: Psychiatry

## 2021-01-07 ENCOUNTER — Other Ambulatory Visit (HOSPITAL_COMMUNITY): Payer: Self-pay | Admitting: Psychiatry

## 2021-01-13 ENCOUNTER — Ambulatory Visit: Payer: Medicare Other | Admitting: Obstetrics & Gynecology

## 2021-02-04 ENCOUNTER — Other Ambulatory Visit (HOSPITAL_COMMUNITY): Payer: Self-pay | Admitting: Psychiatry

## 2021-02-05 ENCOUNTER — Ambulatory Visit: Payer: Medicare Other | Admitting: Obstetrics & Gynecology

## 2021-02-19 ENCOUNTER — Other Ambulatory Visit (HOSPITAL_COMMUNITY)
Admission: RE | Admit: 2021-02-19 | Discharge: 2021-02-19 | Disposition: A | Discharge status: Home / Self Care | Payer: Medicare Other | Source: Ambulatory Visit | Attending: Obstetrics & Gynecology | Admitting: Obstetrics & Gynecology

## 2021-02-19 ENCOUNTER — Ambulatory Visit (INDEPENDENT_AMBULATORY_CARE_PROVIDER_SITE_OTHER): Payer: Medicare Other | Admitting: Obstetrics & Gynecology

## 2021-02-19 ENCOUNTER — Other Ambulatory Visit: Payer: Self-pay

## 2021-02-19 ENCOUNTER — Encounter: Payer: Self-pay | Admitting: Obstetrics & Gynecology

## 2021-02-19 VITALS — BP 150/78 | HR 80 | Resp 16 | Ht 65.0 in | Wt 167.0 lb

## 2021-02-19 DIAGNOSIS — Z01419 Encounter for gynecological examination (general) (routine) without abnormal findings: Secondary | ICD-10-CM | POA: Diagnosis not present

## 2021-02-19 DIAGNOSIS — Z1151 Encounter for screening for human papillomavirus (HPV): Secondary | ICD-10-CM | POA: Diagnosis not present

## 2021-02-19 DIAGNOSIS — R87612 Low grade squamous intraepithelial lesion on cytologic smear of cervix (LGSIL): Secondary | ICD-10-CM | POA: Diagnosis not present

## 2021-02-19 DIAGNOSIS — Z720 Tobacco use: Secondary | ICD-10-CM | POA: Diagnosis not present

## 2021-02-19 NOTE — Progress Notes (Signed)
Subjective:     Sandra Bean is a 60 y.o. female here for a routine exam.  Current complaints: none.  Recently began a new job. Works from home. Reports that she has started wearing the Nicotine patch. Does smoke at home. Recently had a screening CT for lung cancer that was neg.   Gynecologic History No LMP recorded. Patient is postmenopausal. Contraception: status post hysterectomy Last Pap: 12/08/2019. Results were: abnormal Last mammogram: 12/15/2019. Results were: normal  Obstetric History OB History  Gravida Para Term Preterm AB Living  3       3    SAB IAB Ectopic Multiple Live Births    3          # Outcome Date GA Lbr Len/2nd Weight Sex Delivery Anes PTL Lv  3 IAB           2 IAB           1 IAB              The following portions of the patient's history were reviewed and updated as appropriate: allergies, current medications, past family history, past medical history, past social history, past surgical history, and problem list.  Review of Systems Pertinent items are noted in HPI.    Objective:  BP (!) 150/78   Pulse 80   Resp 16   Ht 5\' 5"  (1.651 m)   Wt 167 lb (75.8 kg)   BMI 27.79 kg/m   General Appearance:    Alert, cooperative, no distress, appears stated age  Head:    Normocephalic, without obvious abnormality, atraumatic  Eyes:    conjunctiva/corneas clear, EOM's intact, both eyes  Ears:    Normal external ear canals, both ears  Nose:   Nares normal, septum midline, mucosa normal, no drainage    or sinus tenderness  Throat:   Lips, mucosa, and tongue normal; teeth and gums normal  Neck:   Supple, symmetrical, trachea midline, no adenopathy;    thyroid:  no enlargement/tenderness/nodules  Back:     Symmetric, no curvature, ROM normal, no CVA tenderness  Lungs:     respirations unlabored  Chest Wall:    No tenderness or deformity   Heart:    Regular rate and rhythm  Breast Exam:    No tenderness, masses, or nipple abnormality  Abdomen:     Soft,  non-tender, bowel sounds active all four quadrants,    no masses, no organomegaly  Genitalia:    Normal female without lesion, discharge or tenderness   Cervix in situ. Pap obtained.   Extremities:   Extremities normal, atraumatic, no cyanosis or edema  Pulses:   2+ and symmetric all extremities  Skin:   Skin color, texture, turgor normal, no rashes or lesions     Assessment:    Healthy female exam.    Plan:   Sandra Bean was seen today for gynecologic exam.  Diagnoses and all orders for this visit:  Well female exam with routine gynecological exam -     Cytology - PAP( [facility])  Tobacco use  Low grade squamous intraepithelial lesion (LGSIL) on cervical Pap smear   Reviewed tob cessation- rec no smoking in house.   Repeat PAP obtained.  F/u in 1 year or sooner prn   Sandra Bean, M.D., Sandra Bean

## 2021-02-24 LAB — CYTOLOGY - PAP
Comment: NEGATIVE
Diagnosis: NEGATIVE
High risk HPV: NEGATIVE

## 2021-03-04 ENCOUNTER — Other Ambulatory Visit (HOSPITAL_COMMUNITY): Payer: Self-pay | Admitting: Psychiatry

## 2021-03-10 ENCOUNTER — Telehealth (HOSPITAL_COMMUNITY): Payer: Medicare Other | Admitting: Psychiatry

## 2021-03-19 ENCOUNTER — Encounter (HOSPITAL_COMMUNITY): Payer: Self-pay | Admitting: Psychiatry

## 2021-03-19 ENCOUNTER — Telehealth (INDEPENDENT_AMBULATORY_CARE_PROVIDER_SITE_OTHER): Payer: Medicare Other | Admitting: Psychiatry

## 2021-03-19 DIAGNOSIS — F5102 Adjustment insomnia: Secondary | ICD-10-CM

## 2021-03-19 DIAGNOSIS — F331 Major depressive disorder, recurrent, moderate: Secondary | ICD-10-CM | POA: Diagnosis not present

## 2021-03-19 DIAGNOSIS — F411 Generalized anxiety disorder: Secondary | ICD-10-CM

## 2021-03-19 DIAGNOSIS — F4310 Post-traumatic stress disorder, unspecified: Secondary | ICD-10-CM | POA: Diagnosis not present

## 2021-03-19 MED ORDER — DULOXETINE HCL 30 MG PO CPEP: 30.0000 mg | ORAL_CAPSULE | Freq: Every day | ORAL | 0 refills | Status: DC
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
## 2021-03-24 ENCOUNTER — Other Ambulatory Visit (HOSPITAL_COMMUNITY): Payer: Self-pay | Admitting: Psychiatry

## 2021-04-01 ENCOUNTER — Other Ambulatory Visit (HOSPITAL_COMMUNITY): Payer: Self-pay | Admitting: Psychiatry

## 2021-04-30 ENCOUNTER — Other Ambulatory Visit (HOSPITAL_COMMUNITY): Payer: Self-pay | Admitting: Psychiatry

## 2021-05-27 ENCOUNTER — Other Ambulatory Visit (HOSPITAL_COMMUNITY): Payer: Self-pay | Admitting: Psychiatry

## 2021-05-28 ENCOUNTER — Other Ambulatory Visit: Payer: Self-pay

## 2021-05-28 ENCOUNTER — Encounter: Payer: Self-pay | Admitting: Podiatry

## 2021-05-28 ENCOUNTER — Ambulatory Visit: Payer: Medicare Other | Admitting: Podiatry

## 2021-05-28 DIAGNOSIS — M25571 Pain in right ankle and joints of right foot: Secondary | ICD-10-CM

## 2021-05-28 DIAGNOSIS — M19071 Primary osteoarthritis, right ankle and foot: Secondary | ICD-10-CM

## 2021-05-28 NOTE — Patient Instructions (Signed)
Call [facility] at [phone] to schedule your MRI ? ?

## 2021-05-28 NOTE — Progress Notes (Signed)
?  Subjective:  ?Patient ID: Sandra Bean, female    DOB: [birth year],  MRN: [id number] ? ?Chief Complaint  ?Patient presents with  ? Foot Pain  ?  "Same problem, it's swelling.  The pain came back worse."  ? ? ?61 y.o. female presents with the above complaint. History confirmed with patient.  She presents for follow-up of right foot and ankle pain that has been ongoing.  She last saw Dr. Posey Pronto in July 2022.  She has been recommended to get an MRI but she has not called to schedule this yet mostly due to work issues.  It was manageable until recently and over the last few weeks is gotten quite painful enough that she can hardly stand on it.  She says her diabetes is doing better and is in the 6% range.  She continues to smoke. ? ?Objective:  ?Physical Exam: ?warm, good capillary refill, no trophic changes or ulcerative lesions, normal DP and PT pulses, normal sensory exam, and sharp tenderness over right sinus tarsi and anterior ankle, none medially or over the peroneals. ? ?Assessment:  ? 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 Arthritis of right subtalar joint   ? ? ? ?Plan:  ?Patient was evaluated and treated and all questions answered. ? ?Discussed with her I think she likely has subtalar arthritis or sinus tarsi syndrome.  Recommended she schedule her MRI which she says she will do and I gave her the number for this.  She will follow-up with Dr. Posey Pronto for further treatment following the MRI. ? ?I also discussed injection therapy for symptomatic relief until her MRI is completed.  She agreed to proceed with this today and after sterile prep with Betadine 20 mg of Kenalog and 2 mg of dexamethasone phosphate were injected via sinus tarsi approach into the subtalar joint.  She tolerated procedure well and was dressed with a Band-Aid.  I cautioned her about postinjection steroid flare the next day or 2 and that she should closely watch her blood sugar ? ?Return for after MRI to review.  ? ?

## 2021-06-08 ENCOUNTER — Other Ambulatory Visit: Payer: Medicare Other

## 2021-06-13 ENCOUNTER — Telehealth (INDEPENDENT_AMBULATORY_CARE_PROVIDER_SITE_OTHER): Payer: Medicare Other | Admitting: Psychiatry

## 2021-06-13 ENCOUNTER — Encounter (HOSPITAL_COMMUNITY): Payer: Self-pay | Admitting: Psychiatry

## 2021-06-13 DIAGNOSIS — F411 Generalized anxiety disorder: Secondary | ICD-10-CM

## 2021-06-13 DIAGNOSIS — F5102 Adjustment insomnia: Secondary | ICD-10-CM | POA: Diagnosis not present

## 2021-06-13 DIAGNOSIS — F4310 Post-traumatic stress disorder, unspecified: Secondary | ICD-10-CM | POA: Diagnosis not present

## 2021-06-13 DIAGNOSIS — F331 Major depressive disorder, recurrent, moderate: Secondary | ICD-10-CM | POA: Diagnosis not present

## 2021-06-13 MED ORDER — DULOXETINE HCL 30 MG PO CPEP: 30.0000 mg | ORAL_CAPSULE | Freq: Every day | ORAL | 0 refills | Status: DC

## 2021-06-13 MED ORDER — DESVENLAFAXINE SUCCINATE ER 100 MG PO TB24: 100.0000 mg | ORAL_TABLET | Freq: Every day | ORAL | 0 refills | Status: DC
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
## 2021-06-24 ENCOUNTER — Other Ambulatory Visit (HOSPITAL_COMMUNITY): Payer: Self-pay | Admitting: Psychiatry

## 2021-07-22 ENCOUNTER — Other Ambulatory Visit (HOSPITAL_COMMUNITY): Payer: Self-pay | Admitting: Psychiatry

## 2021-08-07 ENCOUNTER — Other Ambulatory Visit (HOSPITAL_COMMUNITY): Payer: Self-pay | Admitting: Psychiatry

## 2021-08-13 ENCOUNTER — Telehealth: Payer: Self-pay | Admitting: *Deleted

## 2021-08-13 NOTE — Telephone Encounter (Signed)
Patient would like to proceed with MRI, can someone please call her back about this.

## 2021-08-14 ENCOUNTER — Other Ambulatory Visit: Payer: Self-pay | Admitting: Podiatry

## 2021-08-14 DIAGNOSIS — M25571 Pain in right ankle and joints of right foot: Secondary | ICD-10-CM

## 2021-08-14 DIAGNOSIS — M19071 Primary osteoarthritis, right ankle and foot: Secondary | ICD-10-CM

## 2021-08-18 ENCOUNTER — Other Ambulatory Visit (HOSPITAL_COMMUNITY): Payer: Self-pay | Admitting: Psychiatry

## 2021-08-28 ENCOUNTER — Ambulatory Visit
Admission: RE | Admit: 2021-08-28 | Discharge: 2021-08-28 | Disposition: A | Discharge status: Home / Self Care | Payer: Medicare Other | Source: Ambulatory Visit | Attending: Podiatry | Admitting: Podiatry

## 2021-08-28 DIAGNOSIS — M25571 Pain in right ankle and joints of right foot: Secondary | ICD-10-CM

## 2021-08-28 DIAGNOSIS — M19071 Primary osteoarthritis, right ankle and foot: Secondary | ICD-10-CM

## 2021-09-02 ENCOUNTER — Ambulatory Visit: Payer: Medicare Other | Admitting: Podiatry

## 2021-09-02 DIAGNOSIS — M778 Other enthesopathies, not elsewhere classified: Secondary | ICD-10-CM

## 2021-09-02 DIAGNOSIS — Q666 Other congenital valgus deformities of feet: Secondary | ICD-10-CM

## 2021-09-03 NOTE — Progress Notes (Signed)
Subjective:  Patient ID: Sandra Bean, female    DOB: 1960-11-20,  MRN: [id number]  Chief Complaint  Patient presents with   Injections    61 y.o. female presents with the above complaint.  Patient presents with continuous right lateral foot pain.  Patient states that the injection Dr. Lynnell Catalan gave only lasted a month.  She states it started to hurt again.  She is having a lot of arch heel and lateral foot pain.  She wanted to get it evaluated she denies seeing anyone else prior to seeing me.  She is also here to go over her MRI.  She would also Bean to do another injection if possible.   Review of Systems: Negative except as noted in the HPI. Denies N/V/F/Ch.  Past Medical History:  Diagnosis Date   Anemia    Anxiety    PTDS   Arthritis    RIGHT SIDE   COPD (chronic obstructive pulmonary disease) (HCC)    Depression    Diabetes mellitus without complication (HCC)    Type 2   Fibroids    Fibromyalgia    DAILY PAIN   GERD (gastroesophageal reflux disease)    H/O hernia repair 2009   Headache    Hypertension    Hyperthyroidism    NON PER PATIENT   Mental disorder    Mitral valve prolapse    Ovarian cyst     Current Outpatient Medications:    Accu-Chek FastClix Lancets MISC, Check blood sugar once daily., Disp: , Rfl:    amLODipine (NORVASC) 5 MG tablet, Take 5 mg by mouth daily. , Disp: , Rfl: 0   amLODipine (NORVASC) 5 MG tablet, Take 1 tablet by mouth every morning., Disp: , Rfl:    atorvastatin (LIPITOR) 20 MG tablet, Take 20 mg by mouth daily., Disp: , Rfl:    betamethasone dipropionate 0.05 % lotion, Apply topically., Disp: , Rfl:    clonazePAM (KLONOPIN) 0.5 MG tablet, Take by mouth., Disp: , Rfl:    desvenlafaxine (PRISTIQ) 100 MG 24 hr tablet, TAKE 1 TABLET BY MOUTH DAILY, Disp: 90 tablet, Rfl: 0   diclofenac (VOLTAREN) 75 MG EC tablet, Take 75 mg by mouth 2 (two) times daily., Disp: , Rfl:    diclofenac (VOLTAREN) 75 MG EC tablet, Take 1 tablet by mouth daily  as needed., Disp: , Rfl:    DULoxetine (CYMBALTA) 30 MG capsule, Take by mouth., Disp: , Rfl:    DULoxetine (CYMBALTA) 30 MG capsule, TAKE 1 CAPSULE BY MOUTH DAILY, Disp: 90 capsule, Rfl: 0   ferrous gluconate (FERGON) 324 MG tablet, , Disp: , Rfl:    glucose blood (ACCU-CHEK GUIDE) test strip, Check blood sugar once daily., Disp: , Rfl:    losartan-hydrochlorothiazide (HYZAAR) 100-25 MG tablet, Take 1 tablet by mouth daily., Disp: , Rfl:    metFORMIN (GLUCOPHAGE) 500 MG tablet, Take 1 tablet (500 mg total) by mouth 2 (two) times daily with a meal. (Patient taking differently: Take 1,000 mg by mouth 2 (two) times daily with a meal.), Disp: 60 tablet, Rfl: 3   methylPREDNISolone (MEDROL DOSEPAK) 4 MG TBPK tablet, 6 day dose pack - take as directed, Disp: 21 tablet, Rfl: 0   Multiple Vitamin (MULTIVITAMIN IRON-FREE) TABS, Take 1 tablet by mouth daily., Disp: , Rfl:    Multiple Vitamins-Minerals (HAIR SKIN AND NAILS FORMULA) TABS, Take 1 tablet by mouth daily. , Disp: , Rfl:    Omega-3 Fatty Acids (FISH OIL) 1000 MG CAPS, Take by mouth., Disp: ,  Rfl:    omeprazole (PRILOSEC) 20 MG capsule, Take 20 mg by mouth daily. , Disp: , Rfl:    traZODone (DESYREL) 100 MG tablet, TAKE 2 TABLETS BY MOUTH AT  BEDTIME AS NEEDED FOR SLEEP, Disp: 60 tablet, Rfl: 11   vitamin C (ASCORBIC ACID) 500 MG tablet, Take 500 mg by mouth daily., Disp: , Rfl:    vitamin E 1000 UNIT capsule, Take by mouth., Disp: , Rfl:   Social History   Tobacco Use  Smoking Status Every Day   Packs/day: 1.00   Types: Cigarettes  Smokeless Tobacco Never    Allergies  Allergen Reactions   Famotidine Nausea Only   Mirtazapine Other (See Comments)    Overly drowsy, couldn't function    Objective:  There were no vitals filed for this visit. There is no height or weight on file to calculate BMI. Constitutional Well developed. Well nourished.  Vascular Dorsalis pedis pulses palpable bilaterally. Posterior tibial pulses palpable  bilaterally. Capillary refill normal to all digits.  No cyanosis or clubbing noted. Pedal hair growth normal.  Neurologic Normal speech. Oriented to person, place, and time. Epicritic sensation to light touch grossly present bilaterally.  Dermatologic Nails well groomed and normal in appearance. No open wounds. No skin lesions.  Orthopedic: Pes planovalgus foot structure noted with calcaneovalgus to many toe signs unable to recruit the arch with dorsiflexion of the hallux.  Pain on palpation of right lateral ankle at the subtalar joint.  Pain with range of motion of the subtalar joint mildly.  No pain at the posterior tibial tendon/previous surgery.   Radiographs: IMPRESSION: 1. Mild tendinosis of the posterior tibial tendon with a tiny interstitial tear. 2. Subcortical extra-articular bone marrow edema involving the lateral talus as can be seen with posterolateral ankle impingement. 3. 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 Assessment:   1. Capsulitis of right foot   2. Pes planovalgus    Plan:  Patient was evaluated and treated and all questions answered.  Right foot capsulitis -All questions and concerns were discussed with the patient in extensive detail.  Given the amount of pain that she is having we will try 1 more steroid injection of decrease acute inflammatory component of surgical pain.  Patient agrees with plan Bean to proceed with injection. -A steroid injection was performed at right lateral foot at point of maximal tenderness using 1% plain Lidocaine and 10 mg of Kenalog. This was well tolerated.  Right severe pes planovalgus foot deformity -All questions and concerns were discussed with the patient in extensive detail given that all of her pain as well as the MRI points to worse flatfoot deformity and the lateral ankle impingement due to being very flat-footed in a valgus position I believe patient will ultimately benefit  from flatfoot reconstruction.  I briefly discussed that today with the patient she states understanding would Bean to think about it.   No follow-ups on file.

## 2021-09-15 ENCOUNTER — Other Ambulatory Visit: Payer: Medicare Other

## 2021-09-16 ENCOUNTER — Ambulatory Visit: Payer: Medicare Other

## 2021-09-16 DIAGNOSIS — Q666 Other congenital valgus deformities of feet: Secondary | ICD-10-CM

## 2021-10-02 ENCOUNTER — Ambulatory Visit: Payer: Medicare Other | Admitting: Podiatry

## 2021-10-03 ENCOUNTER — Telehealth (INDEPENDENT_AMBULATORY_CARE_PROVIDER_SITE_OTHER): Payer: Medicare Other | Admitting: Psychiatry

## 2021-10-03 ENCOUNTER — Encounter (HOSPITAL_COMMUNITY): Payer: Self-pay | Admitting: Psychiatry

## 2021-10-03 DIAGNOSIS — F331 Major depressive disorder, recurrent, moderate: Secondary | ICD-10-CM | POA: Diagnosis not present

## 2021-10-03 DIAGNOSIS — F4310 Post-traumatic stress disorder, unspecified: Secondary | ICD-10-CM | POA: Diagnosis not present

## 2021-10-03 DIAGNOSIS — F5102 Adjustment insomnia: Secondary | ICD-10-CM

## 2021-10-03 DIAGNOSIS — F411 Generalized anxiety disorder: Secondary | ICD-10-CM | POA: Diagnosis not present

## 2021-10-03 MED ORDER — DESVENLAFAXINE SUCCINATE ER 100 MG PO TB24: 100.0000 mg | ORAL_TABLET | Freq: Every day | ORAL | 0 refills | Status: DC

## 2021-10-03 MED ORDER — DULOXETINE HCL 30 MG PO CPEP: 30.0000 mg | ORAL_CAPSULE | Freq: Every day | ORAL | 0 refills | Status: DC
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
## 2021-10-28 ENCOUNTER — Ambulatory Visit (INDEPENDENT_AMBULATORY_CARE_PROVIDER_SITE_OTHER): Payer: Medicare Other

## 2021-10-28 DIAGNOSIS — Q666 Other congenital valgus deformities of feet: Secondary | ICD-10-CM

## 2021-10-28 NOTE — Progress Notes (Signed)
Patient presents today to pick up custom molded foot orthotics, diagnosed with pes planovalgus by Dr. Posey Pronto.   Orthotics were dispensed and fit was satisfactory. Reviewed instructions for break-in and wear. Written instructions given to patient.  Patient will follow up as needed.   [facility] - order # [id number]

## 2021-12-04 ENCOUNTER — Other Ambulatory Visit (HOSPITAL_COMMUNITY): Payer: Self-pay | Admitting: Psychiatry

## 2022-01-18 ENCOUNTER — Other Ambulatory Visit (HOSPITAL_COMMUNITY): Payer: Self-pay | Admitting: Psychiatry

## 2022-02-06 ENCOUNTER — Telehealth (INDEPENDENT_AMBULATORY_CARE_PROVIDER_SITE_OTHER): Payer: Medicare Other | Admitting: Psychiatry

## 2022-02-06 ENCOUNTER — Encounter (HOSPITAL_COMMUNITY): Payer: Self-pay | Admitting: Psychiatry

## 2022-02-06 DIAGNOSIS — F4310 Post-traumatic stress disorder, unspecified: Secondary | ICD-10-CM | POA: Diagnosis not present

## 2022-02-06 DIAGNOSIS — F411 Generalized anxiety disorder: Secondary | ICD-10-CM

## 2022-02-06 DIAGNOSIS — F5102 Adjustment insomnia: Secondary | ICD-10-CM

## 2022-02-06 DIAGNOSIS — F331 Major depressive disorder, recurrent, moderate: Secondary | ICD-10-CM

## 2022-02-06 MED ORDER — DESVENLAFAXINE SUCCINATE ER 100 MG PO TB24: 100.0000 mg | ORAL_TABLET | Freq: Every day | ORAL | 0 refills | Status: DC
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
## 2022-03-22 ENCOUNTER — Other Ambulatory Visit (HOSPITAL_COMMUNITY): Payer: Self-pay | Admitting: Psychiatry

## 2022-04-01 DIAGNOSIS — R69 Illness, unspecified: Secondary | ICD-10-CM | POA: Diagnosis not present

## 2022-04-01 DIAGNOSIS — I10 Essential (primary) hypertension: Secondary | ICD-10-CM | POA: Diagnosis not present

## 2022-04-01 DIAGNOSIS — D509 Iron deficiency anemia, unspecified: Secondary | ICD-10-CM | POA: Diagnosis not present

## 2022-04-01 DIAGNOSIS — R040 Epistaxis: Secondary | ICD-10-CM | POA: Diagnosis not present

## 2022-04-02 DIAGNOSIS — M272 Inflammatory conditions of jaws: Secondary | ICD-10-CM | POA: Diagnosis not present

## 2022-04-16 ENCOUNTER — Ambulatory Visit: Payer: Medicare Other | Admitting: Family Medicine

## 2022-04-21 ENCOUNTER — Encounter: Payer: Self-pay | Admitting: Family Medicine

## 2022-04-21 ENCOUNTER — Ambulatory Visit (INDEPENDENT_AMBULATORY_CARE_PROVIDER_SITE_OTHER): Payer: Medicare HMO | Admitting: Family Medicine

## 2022-04-21 VITALS — Temp 99.4°F | Ht 66.0 in | Wt 166.8 lb

## 2022-04-21 DIAGNOSIS — I10 Essential (primary) hypertension: Secondary | ICD-10-CM

## 2022-04-21 DIAGNOSIS — K219 Gastro-esophageal reflux disease without esophagitis: Secondary | ICD-10-CM | POA: Diagnosis not present

## 2022-04-21 DIAGNOSIS — M19012 Primary osteoarthritis, left shoulder: Secondary | ICD-10-CM | POA: Diagnosis not present

## 2022-04-21 DIAGNOSIS — E119 Type 2 diabetes mellitus without complications: Secondary | ICD-10-CM | POA: Diagnosis not present

## 2022-04-21 LAB — POCT GLYCOSYLATED HEMOGLOBIN (HGB A1C): Hemoglobin A1C: 5.8 % — AB (ref 4.0–5.6)

## 2022-04-21 MED ORDER — LOSARTAN POTASSIUM-HCTZ 100-25 MG PO TABS: 1.0000 | ORAL_TABLET | Freq: Every day | ORAL | 3 refills | Status: DC

## 2022-04-21 MED ORDER — DICLOFENAC SODIUM 75 MG PO TBEC: 75.0000 mg | DELAYED_RELEASE_TABLET | Freq: Two times a day (BID) | ORAL | 1 refills | Status: DC

## 2022-04-21 MED ORDER — ATORVASTATIN CALCIUM 20 MG PO TABS: 20.0000 mg | ORAL_TABLET | Freq: Every day | ORAL | 3 refills | Status: DC

## 2022-04-21 MED ORDER — METOPROLOL SUCCINATE ER 50 MG PO TB24: 50.0000 mg | ORAL_TABLET | Freq: Every day | ORAL | 3 refills | Status: DC

## 2022-04-21 MED ORDER — AMLODIPINE BESYLATE 5 MG PO TABS: 5.0000 mg | ORAL_TABLET | Freq: Every morning | ORAL | 3 refills | Status: DC

## 2022-04-21 MED ORDER — OMEPRAZOLE 20 MG PO CPDR: 20.0000 mg | DELAYED_RELEASE_CAPSULE | Freq: Every day | ORAL | 1 refills | Status: DC

## 2022-04-21 NOTE — Assessment & Plan Note (Signed)
Now diet controlled, off metformin, A1C today is 5.8, will continue to monitor this at each visit.

## 2022-04-21 NOTE — Assessment & Plan Note (Signed)
On diclofenac 75 mg BID PRN, this is controlling her pain well, will refill this for her.

## 2022-04-21 NOTE — Progress Notes (Signed)
New Patient Office Visit  Subjective    Patient ID: Sandra Bean, female    DOB: 31-Mar-1960  Age: 62 y.o. MRN: [id number]  CC:  Chief Complaint  Patient presents with   Establish Care    HPI YAKIMA KREITZER presents to establish care Patient was seeing Dr. Valora Piccolo. States that she has a history of diabetes, was previously on metformin but she reports she stopped taking the medication because she did not want to be on it long term. Last A1C was 6.1 in August which is under good control. Today her A1C is 5.8.  Patient also reports a history of iron deficiency anemia and she sees the hematologist for. States that she gets regular iron infusions for this. I reviewed her last CBC and iron panel, Hb was WNL and iron was in the normal range.   She also sees a [facility] provider -- reports she has PTSD and depression, states that she no longer takes the clonazepam, hasn't in some time. States that her symptoms are controlled on the current medications. Sees him every 3-4 months regularly.   HTN-- pt has a history of this, states that she is taking amlodipine 5 mg, metoprolol 50 mg and Losartan HCT daily. Reports she is doing well on this medication.  Outpatient Encounter Medications as of 04/21/2022  Medication Sig   Accu-Chek FastClix Lancets MISC Check blood sugar once daily.   desvenlafaxine (PRISTIQ) 100 MG 24 hr tablet Take 1 tablet (100 mg total) by mouth daily.   DULoxetine (CYMBALTA) 30 MG capsule TAKE 1 CAPSULE BY MOUTH DAILY   ferrous gluconate (FERGON) 324 MG tablet    glucose blood (ACCU-CHEK GUIDE) test strip Check blood sugar once daily.   Multiple Vitamin (MULTIVITAMIN IRON-FREE) TABS Take 1 tablet by mouth daily.   Multiple Vitamins-Minerals (HAIR SKIN AND NAILS FORMULA) TABS Take 1 tablet by mouth daily.    Omega-3 Fatty Acids (FISH OIL) 1000 MG CAPS Take by mouth.   traZODone (DESYREL) 100 MG tablet TAKE 2 TABLETS BY MOUTH AT  BEDTIME AS NEEDED FOR SLEEP   vitamin C (ASCORBIC ACID) 500  MG tablet Take 500 mg by mouth daily.   vitamin E 1000 UNIT capsule Take by mouth.   [DISCONTINUED] amLODipine (NORVASC) 5 MG tablet Take 5 mg by mouth daily.    [DISCONTINUED] amLODipine (NORVASC) 5 MG tablet Take 1 tablet by mouth every morning.   [DISCONTINUED] atorvastatin (LIPITOR) 20 MG tablet Take 20 mg by mouth daily.   [DISCONTINUED] betamethasone dipropionate 0.05 % lotion Apply topically.   [DISCONTINUED] clonazePAM (KLONOPIN) 0.5 MG tablet Take by mouth.   [DISCONTINUED] diclofenac (VOLTAREN) 75 MG EC tablet Take 75 mg by mouth 2 (two) times daily.   [DISCONTINUED] diclofenac (VOLTAREN) 75 MG EC tablet Take 1 tablet by mouth daily as needed.   [DISCONTINUED] losartan-hydrochlorothiazide (HYZAAR) 100-25 MG tablet Take 1 tablet by mouth daily.   [DISCONTINUED] metFORMIN (GLUCOPHAGE) 500 MG tablet Take 1 tablet (500 mg total) by mouth 2 (two) times daily with a meal. (Patient taking differently: Take 1,000 mg by mouth 2 (two) times daily with a meal.)   [DISCONTINUED] methylPREDNISolone (MEDROL DOSEPAK) 4 MG TBPK tablet 6 day dose pack - take as directed   [DISCONTINUED] metoprolol succinate (TOPROL-XL) 50 MG 24 hr tablet Take 1 tablet by mouth daily.   [DISCONTINUED] omeprazole (PRILOSEC) 20 MG capsule Take 20 mg by mouth daily.    amLODipine (NORVASC) 5 MG tablet Take 1 tablet (5 mg total) by mouth  every morning.   atorvastatin (LIPITOR) 20 MG tablet Take 1 tablet (20 mg total) by mouth daily.   diclofenac (VOLTAREN) 75 MG EC tablet Take 1 tablet (75 mg total) by mouth 2 (two) times daily.   losartan-hydrochlorothiazide (HYZAAR) 100-25 MG tablet Take 1 tablet by mouth daily.   metoprolol succinate (TOPROL-XL) 50 MG 24 hr tablet Take 1 tablet (50 mg total) by mouth daily.   omeprazole (PRILOSEC) 20 MG capsule Take 1 capsule (20 mg total) by mouth daily.   [DISCONTINUED] prazosin (MINIPRESS) 5 MG capsule Take 5 mg by mouth at bedtime.    No facility-administered encounter medications  on file as of 04/21/2022.    Past Medical History:  Diagnosis Date   Anemia    Anxiety    PTDS   Arthritis    RIGHT SIDE   COPD (chronic obstructive pulmonary disease) (HCC)    Depression    Diabetes mellitus without complication (HCC)    Type 2   Fibroids    Fibromyalgia    DAILY PAIN   GERD (gastroesophageal reflux disease)    H/O hernia repair 2009   Headache    Hypertension    Hyperthyroidism    NON PER PATIENT   Mental disorder    Mitral valve prolapse    Ovarian cyst     Past Surgical History:  Procedure Laterality Date   ABDOMINAL HYSTERECTOMY     BREAST SURGERY     FOOT SURGERY     HERNIA REPAIR     LAPAROTOMY Bilateral 01/19/2017   Procedure: MINI-LAPAROTOMY TO REMOVE UTERUS, BILATERAL FALLOPIAN TUBES AND BILATERAL OVARIES;  Surgeon: Lavonia Drafts, MD;  Location: [facility];  Service: Gynecology;  Laterality: Bilateral;   NOSE SURGERY     ROBOTIC ASSISTED TOTAL HYSTERECTOMY WITH BILATERAL SALPINGO OOPHERECTOMY Bilateral 01/19/2017   Procedure: ROBOTIC ASSISTED TOTAL LAPAROSCOPIC SUPRACERVICAL HYSTERECTOMY WITH BILATERAL SALPINGO OOPHORECTOMY WITH MINI LAPAROTOMY TO REMOVE SPECIMEN;  Surgeon: Lavonia Drafts, MD;  Location: [facility];  Service: Gynecology;  Laterality: Bilateral;   THERAPEUTIC ABORTION      Family History  Problem Relation Age of Onset   Depression Mother    Alcohol abuse Mother    Hypertension Father    Alcohol abuse Sister    Diabetes Sister    Hypertension Sister    Diabetes Brother    Hypertension Brother     Social History   Socioeconomic History   Marital status: Divorced    Spouse name: Not on file   Number of children: Not on file   Years of education: Not on file   Highest education level: Not on file  Occupational History   Not on file  Tobacco Use   Smoking status: Every Day    Packs/day: 1.00    Types: Cigarettes   Smokeless tobacco: Never  Vaping Use   Vaping Use: Never used  Substance and Sexual  Activity   Alcohol use: No   Drug use: No   Sexual activity: Not Currently    Birth control/protection: Post-menopausal  Other Topics Concern   Not on file  Social History Narrative   Not on file   Social Determinants of Health   Financial Resource Strain: Not on file  Food Insecurity: Not on file  Transportation Needs: Not on file  Physical Activity: Not on file  Stress: Not on file  Social Connections: Not on file  Intimate Partner Violence: Not on file    Review of Systems  All other systems reviewed and are negative.  Objective    Temp 99.4 F (37.4 C) (Oral)   Ht '5\' 6"'$  (1.676 m)   Wt 166 lb 12.8 oz (75.7 kg)   BMI 26.92 kg/m   Physical Exam Vitals reviewed.  Constitutional:      Appearance: Normal appearance. She is well-groomed and normal weight.  Eyes:     Conjunctiva/sclera: Conjunctivae normal.  Neck:     Thyroid: No thyromegaly.  Cardiovascular:     Rate and Rhythm: Normal rate and regular rhythm.     Pulses: Normal pulses.     Heart sounds: S1 normal and S2 normal.  Pulmonary:     Effort: Pulmonary effort is normal.     Breath sounds: Normal breath sounds and air entry.  Abdominal:     General: Bowel sounds are normal.  Musculoskeletal:     Right lower leg: No edema.     Left lower leg: No edema.  Neurological:     Mental Status: She is alert and oriented to person, place, and time. Mental status is at baseline.     Gait: Gait is intact.  Psychiatric:        Mood and Affect: Mood and affect normal.        Speech: Speech normal.        Behavior: Behavior normal.        Judgment: Judgment normal.          Assessment & Plan:   Problem List Items Addressed This Visit       Unprioritized   Benign essential hypertension - Primary    Current hypertension medications:       Sig   amLODipine (NORVASC) 5 MG tablet Take 1 tablet (5 mg total) by mouth every morning.   losartan-hydrochlorothiazide (HYZAAR) 100-25 MG tablet Take 1  tablet by mouth daily.   metoprolol succinate (TOPROL-XL) 50 MG 24 hr tablet Take 1 tablet (50 mg total) by mouth daily.     BP is well controlled at home. Will refill these medications. I was able to review her previous labs from last year in the computer.        Relevant Medications   amLODipine (NORVASC) 5 MG tablet   losartan-hydrochlorothiazide (HYZAAR) 100-25 MG tablet   metoprolol succinate (TOPROL-XL) 50 MG 24 hr tablet   atorvastatin (LIPITOR) 20 MG tablet   Type 2 diabetes mellitus, without long-term current use of insulin (HCC)    Now diet controlled, off metformin, A1C today is 5.8, will continue to monitor this at each visit.       Relevant Medications   losartan-hydrochlorothiazide (HYZAAR) 100-25 MG tablet   atorvastatin (LIPITOR) 20 MG tablet   Other Relevant Orders   POC HgB A1c (Completed)   Arthritis of shoulder region, left    On diclofenac 75 mg BID PRN, this is controlling her pain well, will refill this for her.       Relevant Medications   diclofenac (VOLTAREN) 75 MG EC tablet   Other Visit Diagnoses     Gastroesophageal reflux disease without esophagitis       Relevant Medications   omeprazole (PRILOSEC) 20 MG capsule       Return in about 3 months (around 07/21/2022) for DM.   Farrel Conners, MD

## 2022-04-21 NOTE — Assessment & Plan Note (Signed)
Current hypertension medications:       Sig   amLODipine (NORVASC) 5 MG tablet Take 1 tablet (5 mg total) by mouth every morning.   losartan-hydrochlorothiazide (HYZAAR) 100-25 MG tablet Take 1 tablet by mouth daily.   metoprolol succinate (TOPROL-XL) 50 MG 24 hr tablet Take 1 tablet (50 mg total) by mouth daily.     BP is well controlled at home. Will refill these medications. I was able to review her previous labs from last year in the computer.

## 2022-04-29 ENCOUNTER — Telehealth (HOSPITAL_COMMUNITY): Payer: Self-pay | Admitting: *Deleted

## 2022-04-29 MED ORDER — DESVENLAFAXINE SUCCINATE ER 100 MG PO TB24: 100.0000 mg | ORAL_TABLET | Freq: Every day | ORAL | 0 refills | Status: DC

## 2022-04-29 NOTE — Telephone Encounter (Signed)
PATIENT CALLED TO REQUEST REFILL & TO INFORM NEW Rx PREFERRED BY INSURANCE -- desvenlafaxine (PRISTIQ) 100 MG 24 hr tablet   [facility] [place] [place] to Registered [employer] Sites   LAST APPT   02/06/22 NEXT APPT   06/03/22

## 2022-04-29 NOTE — Addendum Note (Signed)
Addended by: Merian Capron on: 04/29/2022 02:37 PM   Modules accepted: Orders

## 2022-04-29 NOTE — Telephone Encounter (Signed)
Patient LVM that her insurance  is now [employer]  & that  uses the preferred  Rx-- but wasn't clear of name of Rx-- LVM for return call to clarify

## 2022-05-14 ENCOUNTER — Telehealth: Payer: Self-pay | Admitting: Family Medicine

## 2022-05-14 NOTE — Telephone Encounter (Signed)
Contacted [facility] to schedule their annual wellness visit. Appointment made for 05/21/22.  [facility]ark[facility] [employer] [facility] direct phone # [phone]

## 2022-05-21 ENCOUNTER — Ambulatory Visit (INDEPENDENT_AMBULATORY_CARE_PROVIDER_SITE_OTHER): Payer: Medicare HMO | Admitting: Family Medicine

## 2022-05-21 DIAGNOSIS — Z0000 Encounter for general adult medical examination without abnormal findings: Secondary | ICD-10-CM

## 2022-05-21 NOTE — Progress Notes (Signed)
PATIENT CHECK-IN and HEALTH RISK ASSESSMENT QUESTIONNAIRE:  -completed by phone/video for upcoming Medicare Preventive Visit  Pre-Visit Check-in: 1)Vitals (height, wt, BP, etc) - record in vitals section for visit on day of visit 2)Review and Update Medications, Allergies PMH, Surgeries, Social history in Epic 3)Hospitalizations in the last year with date/reason?  No 4)Review and Update Care Team (patient's specialists) in Epic 5) Complete PHQ9 in Epic  6) Complete Fall Screening in Epic 7)Review all Health Maintenance Due and order under PCP if not done.  8)Medicare Wellness Questionnaire: Answer theses question about your habits: Do you drink alcohol? Yes   If yes, how many drinks do you have a day?   Rare Have you ever smoked?Yes    Quit date if applicable?  currently  a smoker How many packs a day do/did you smoke? 1 pack per day, interested in quitting - she quit last year using the patch, but then restarted. She plans to start over again - but reports now is not a good time. She is working with some job stress right now and then she plans to quit.  Do you use smokeless tobacco?No  Do you use an illicit drugs?No  Do you exercises? No    IF so, what type and how many days/minutes per week?patient does walk dog - 15000! Steps daily!!! Walks 3x per day. Also starting silver sneakers.  Are you sexually active? No Number of partners?0  Typical breakfast: banana, cottage cheese.  Smoothie.  Typical lunch-eat a late breakfast.   No lunch Typical dinner: protein, vegetables Typical snacks:no snack  Beverages: water, V8, coffee, green tea  Answer theses question about you: Can you perform most household chores?Yes Do you find it hard to follow a conversation in a noisy room?No  Do you often ask people to speak up or repeat themselves?No Do you feel that you have a problem with memory?No  Do you balance your checkbook and or bank acounts?Yes Do you feel safe at home?Yes Last dentist  visit?05/20/22 Do you need assistance with any of the following: Please note if so No, assistance needed with below  Driving?  Feeding yourself?  Getting from bed to chair?  Getting to the toilet?  Bathing or showering?  Dressing yourself?  Managing money?  Climbing a flight of stairs  Preparing meals?  Do you have Advanced Directives in place (Living Will, Healthcare Power or Attorney)? She is working on this currently.     Last eye Exam and location?Dec 2022. [facility] in [place]   Do you currently use prescribed or non-prescribed narcotic or opioid pain medications?No  Do you have a history or close family history of breast, ovarian, tubal or peritoneal cancer or a family member with BRCA (breast cancer susceptibility 1 and 2) gene mutations?  Nurse/Assistant Credentials/time stamp:St    ----------------------------------------------------------------------------------------------------------------------------------------------------------------------------------------------------------------------   MEDICARE ANNUAL PREVENTIVE VISIT WITH PROVIDER: (Welcome to [employer], initial annual wellness or annual wellness exam)  Virtual Visit via Phone Note  I connected with Sandra Bean on 05/21/22 by phone and verified that I am speaking with the correct person using two identifiers.  Location patient: home Location provider:work or home office Persons participating in the virtual visit: patient, provider  Concerns and/or follow up today:   See HM section in Epic for other details of completed HM.    ROS: negative for report of fevers, unintentional weight loss, vision changes, vision loss, hearing loss or change, chest pain, sob, hemoptysis, melena, hematochezia, hematuria, genital discharge or lesions,  falls, bleeding or bruising, loc, thoughts of suicide or self harm, memory loss  Patient-completed extensive health risk assessment - reviewed and discussed with the  patient: See Health Risk Assessment completed with patient prior to the visit either above or in recent phone note. This was reviewed in detailed with the patient today and appropriate recommendations, orders and referrals were placed as needed per Summary below and patient instructions.   Review of Medical History: -PMH, PSH, Family History and current specialty and care providers reviewed and updated and listed below   Patient Care Team: Farrel Conners, MD as PCP - General (Family Medicine)   Past Medical History:  Diagnosis Date   Anemia    Anxiety    PTDS   Arthritis    RIGHT SIDE   COPD (chronic obstructive pulmonary disease) ([place])    Depression    Diabetes mellitus without complication ([place])    Type 2   Fibroids    Fibromyalgia    DAILY PAIN   GERD (gastroesophageal reflux disease)    H/O hernia repair 2009   Headache    Hypertension    Hyperthyroidism    NON PER PATIENT   Mental disorder    Mitral valve prolapse    Ovarian cyst     Past Surgical History:  Procedure Laterality Date   ABDOMINAL HYSTERECTOMY     BREAST SURGERY     FOOT SURGERY     HERNIA REPAIR     LAPAROTOMY Bilateral 01/19/2017   Procedure: MINI-LAPAROTOMY TO REMOVE UTERUS, BILATERAL FALLOPIAN TUBES AND BILATERAL OVARIES;  Surgeon: Lavonia Drafts, MD;  Location: [facility];  Service: Gynecology;  Laterality: Bilateral;   NOSE SURGERY     ROBOTIC ASSISTED TOTAL HYSTERECTOMY WITH BILATERAL SALPINGO OOPHERECTOMY Bilateral 01/19/2017   Procedure: ROBOTIC ASSISTED TOTAL LAPAROSCOPIC SUPRACERVICAL HYSTERECTOMY WITH BILATERAL SALPINGO OOPHORECTOMY WITH MINI LAPAROTOMY TO REMOVE SPECIMEN;  Surgeon: Lavonia Drafts, MD;  Location: [facility];  Service: Gynecology;  Laterality: Bilateral;   THERAPEUTIC ABORTION      Social History   Socioeconomic History   Marital status: Divorced    Spouse name: Not on file   Number of children: Not on file   Years of education: Not on file   Highest  education level: Not on file  Occupational History   Not on file  Tobacco Use   Smoking status: Every Day    Packs/day: 1.00    Types: Cigarettes   Smokeless tobacco: Never  Vaping Use   Vaping Use: Never used  Substance and Sexual Activity   Alcohol use: No   Drug use: No   Sexual activity: Not Currently    Birth control/protection: Post-menopausal  Other Topics Concern   Not on file  Social History Narrative   Not on file   Social Determinants of Health   Financial Resource Strain: Not on file  Food Insecurity: Not on file  Transportation Needs: Not on file  Physical Activity: Not on file  Stress: Not on file  Social Connections: Not on file  Intimate Partner Violence: Not on file    Family History  Problem Relation Age of Onset   Depression Mother    Alcohol abuse Mother    Hypertension Father    Alcohol abuse Sister    Diabetes Sister    Hypertension Sister    Diabetes Brother    Hypertension Brother     Current Outpatient Medications on File Prior to Visit  Medication Sig Dispense Refill   Accu-Chek FastClix Lancets MISC  Check blood sugar once daily.     amLODipine (NORVASC) 5 MG tablet Take 1 tablet (5 mg total) by mouth every morning. 90 tablet 3   atorvastatin (LIPITOR) 20 MG tablet Take 1 tablet (20 mg total) by mouth daily. 90 tablet 3   desvenlafaxine (PRISTIQ) 100 MG 24 hr tablet Take 1 tablet (100 mg total) by mouth daily. 90 tablet 0   diclofenac (VOLTAREN) 75 MG EC tablet Take 1 tablet (75 mg total) by mouth 2 (two) times daily. 180 tablet 1   DULoxetine (CYMBALTA) 30 MG capsule TAKE 1 CAPSULE BY MOUTH DAILY 90 capsule 0   ferrous gluconate (FERGON) 324 MG tablet      glucose blood (ACCU-CHEK GUIDE) test strip Check blood sugar once daily.     losartan-hydrochlorothiazide (HYZAAR) 100-25 MG tablet Take 1 tablet by mouth daily. 90 tablet 3   metoprolol succinate (TOPROL-XL) 50 MG 24 hr tablet Take 1 tablet (50 mg total) by mouth daily. 90 tablet 3    Multiple Vitamin (MULTIVITAMIN IRON-FREE) TABS Take 1 tablet by mouth daily.     Multiple Vitamins-Minerals (HAIR SKIN AND NAILS FORMULA) TABS Take 1 tablet by mouth daily.      Omega-3 Fatty Acids (FISH OIL) 1000 MG CAPS Take by mouth.     omeprazole (PRILOSEC) 20 MG capsule Take 1 capsule (20 mg total) by mouth daily. 90 capsule 1   traZODone (DESYREL) 100 MG tablet TAKE 2 TABLETS BY MOUTH AT  BEDTIME AS NEEDED FOR SLEEP 60 tablet 11   vitamin C (ASCORBIC ACID) 500 MG tablet Take 500 mg by mouth daily.     vitamin E 1000 UNIT capsule Take by mouth.     [DISCONTINUED] prazosin (MINIPRESS) 5 MG capsule Take 5 mg by mouth at bedtime.      No current facility-administered medications on file prior to visit.    Allergies  Allergen Reactions   Famotidine Nausea Only   Mirtazapine Other (See Comments)    Overly drowsy, couldn't function        Physical Exam There were no vitals filed for this visit. Estimated body mass index is 26.92 kg/m as calculated from the following:   Height as of 04/21/22: '5\' 6"'$  (1.676 m).   Weight as of 04/21/22: 166 lb 12.8 oz (75.7 kg).  EKG (optional): deferred due to virtual visit  GENERAL: alert, oriented, no acute distress detected, full vision exam deferred due to pandemic and/or virtual encounter  PSYCH/NEURO: pleasant and cooperative, no obvious depression or anxiety, speech and thought processing grossly intact, Cognitive function grossly intact  [place] Office Visit from 05/21/2022 in [facility] at [facility]  PHQ-9 Total Score 21           05/21/2022    3:52 PM 10/29/2016    2:51 PM  Depression screen PHQ 2/9  Decreased Interest 3 3  Down, Depressed, Hopeless 3 3  PHQ - 2 Score 6 6  Altered sleeping 3 3  Tired, decreased energy 3 3  Change in appetite 0 3  Feeling bad or failure about yourself  3 3  Trouble concentrating 3 3  Moving slowly or fidgety/restless 3 2  Suicidal thoughts 0 1  PHQ-9 Score 21 24   Difficult doing work/chores Not difficult at all   Reports is stable and seeing psychiatrist. Feels safe.      01/26/2018    4:53 PM 06/06/2018   12:45 PM 07/23/2018    9:24 PM 05/21/2022    3:52  PM  Fall Risk  Falls in the past year?    0  Was there an injury with Fall?    0  Fall Risk Category Calculator    0  (RETIRED) Patient Fall Risk Level Low fall risk Low fall risk Low fall risk      SUMMARY AND PLAN:  Encounter for Medicare annual wellness exam   Discussed applicable health maintenance/preventive health measures and advised and referred or ordered per patient preferences:  Health Maintenance  Topic Date Due   FOOT EXAM  Plans to do with PCP at upcoming visit   [place]  Never done, reports she will schedule   Diabetic kidney evaluation - Urine ACR  Plans to do with PCP at upcoming visit   Hepatitis C Screening  Plans to do with PCP at upcoming visit   COLONOSCOPY (Pts 45-7yr Insurance coverage will need to be confirmed)  [facility] and reports was told was normal. Asked nursing staff to obtain report to be sent to PCP office to abstract.   Lung Cancer Scre[facility]   [facility] and reports was told was normal. Asked nursing staff to obtain report to be sent to PCP office to abstract.   [facility]  [facility] and reports was told was normal. Asked nursing staff to obtain report to be sent to PCP.    Diabetic kidney evaluation - eGFR measurement  Plans to do with PCP at upcoming visit.    Medicare Annual Wellness (AWV)  06/25/2023   INFLUENZA VACCINE  10/21/2021, advised can do at pharmacy   COVID-19 Vaccine (6 - 2023-24 season) 11/21/2021, advised, can do at pharmacy   HEMOGLOBIN A1C  10/20/2022   PAP SMEAR-Modifier  02/20/2024   DTaP/Tdap/Td (3 - Td or Tdap) 09/11/2028   HIV Screening  Completed   Zoster Vaccines- Shingrix  Completed   HPV VACCINES   Aged OSafeco Corporationand counseling on the following was provided based on the above review of health and a plan/checklist for the patient, along with additional information discussed, was provided for the patient in the patient instructions :  -Advised on importance of and resources for completing advanced directives - she is in the process of completing, further info provided -continues care with her psychiatrist per her report and feels is doing ok -Congratulated on regular exercise and goals and healthier diet, further infor provided in patient instructions. -Advise yearly dental visits at minimum and regular eye exams -Advised and counseled on tobacco use, in contemplation phase, has quit in the past, offered help, let her know about quit line. She is not ready to quit again right now.   Follow up: see patient instructions     Patient Instructions  I really enjoyed getting to talk with you today! I am available on Tuesdays and Thursdays for virtual visits if you have any questions or concerns, or if I can be of any further assistance.   CHECKLIST FROM ANNUAL WELLNESS VISIT:  -Follow up (please call to schedule if not scheduled after visit):  -yearly for annual wellness visit with primary care office  Here is a list of your preventive care/health maintenance measures and the plan for each if any are due:  Health Maintenance  Topic Date Due   FOOT EXAM  Please request at upcoming appointment with your doctor   OPHTHALMOLOGY EXAM  Please schedule yearly diabetic eye exam with eye doctor   Diabetic kidney evaluation - Urine ACR  Can do labs at upcoming appointment with your doctor   Hepatitis C Screening  Can do labs at upcoming appointment with your doctor   COLONOSCOPY (Pts 45-92yr Insurance coverage will need to be confirmed)  Advised nurse to request report, if you get a copy - please bring a copy to our office   Lung Cancer Screening  Should be done year, Advised nurse to  request report, if you get a copy - please bring a copy to our office   MAMMOGRAM  Should be done yearly, Advised nurse to request report, if you get a copy - please bring a copy to our office   Diabetic kidney evaluation - eGFR measurement  Can do labs at upcoming appointment with your doctor   INFLUENZA VACCINE  10/21/2021, can do at pharmacy   COVID-19 Vaccine (6 - 2023-24 season) 11/21/2021, can do at pharmacy   HEMOGLOBIN A1C  10/20/2022   Medicare Annual Wellness (AWV)  05/21/2023   PAP SMEAR-Modifier  02/20/2024   DTaP/Tdap/Td (3 - Td or Tdap) 09/11/2028   HIV Screening  Completed   Zoster Vaccines- Shingrix  Completed   HPV VACCINES  Aged Out    -See a dentist at least yearly  -Get your eyes checked and then per your eye specialist's recommendations  -Other issues addressed today: -Please quit smoking when able - let [place] if you need any help.  -I have included below further information regarding a healthy whole foods based diet, physical activity guidelines for adults, stress management and opportunities for social connections. I hope you find this information useful.   -----------------------------------------------------------------------------------------------------------------------------------------------------------------------------------------------------------------------------------------------------------  NUTRITION: -eat real food: lots of colorful vegetables (half the plate) and fruits -5-7 servings of vegetables and fruits per day (fresh or steamed is best), exp. 2 servings of vegetables with lunch and dinner and 2 servings of fruit per day. Berries and greens such as kale and collards are great choices.  -consume on a regular basis: whole grains (make sure first ingredient on label contains the word "whole"), fresh fruits, fish, nuts, seeds, healthy oils (such as olive oil, avocado oil, grape seed oil) -may eat small amounts of dairy and lean meat on occasion,  but avoid processed meats such as ham, bacon, lunch meat, etc. -drink water -try to avoid fast food and pre-packaged foods, processed meat -most experts advise limiting sodium to < '2300mg'$  per day, should limit further is any chronic conditions such as high blood pressure, heart disease, diabetes, etc. The American Heart Association advised that < '1500mg'$  is is ideal -try to avoid foods that contain any ingredients with names you do not recognize  -try to avoid sugar/sweets (except for the natural sugar that occurs in fresh fruit) -try to avoid sweet drinks -try to avoid white rice, white bread, pasta (unless whole grain), white or yellow potatoes  EXERCISE GUIDELINES FOR ADULTS: -if you wish to increase your physical activity, do so gradually and with the approval of your doctor -STOP and seek medical care immediately if you have any chest pain, chest discomfort or trouble breathing when starting or increasing exercise  -move and stretch your body, legs, feet and arms when sitting for long periods -Physical activity guidelines for optimal health in adults: -least 150 minutes per week of aerobic exercise (can talk, but not sing) once approved by your doctor, 20-30 minutes of sustained activity or two 10 minute episodes of  sustained activity every day.  -resistance training at least 2 days per week if approved by your doctor -balance exercises 3+ days per week:   Stand somewhere where you have something sturdy to hold onto if you lose balance.    1) lift up on toes, start with 5x per day and work up to 20x   2) stand and lift on leg straight out to the side so that foot is a few inches of the floor, start with 5x each side and work up to 20x each side   3) stand on one foot, start with 5 seconds each side and work up to 20 seconds on each side  If you need ideas or help with getting more active:  -Silver sneakers https://tools.silversneakers.com  -Walk with a  Doc: http://stephens-thompson.biz/  -try to include resistance (weight lifting/strength building) and balance exercises twice per week: or the following link for ideas: ChessContest.fr  UpdateClothing.com.cy  STRESS MANAGEMENT: -can try meditating, or just sitting quietly with deep breathing while intentionally relaxing all parts of your body for 5 minutes daily -if you need further help with stress, anxiety or depression please follow up with your primary doctor or contact the wonderful folks at [place]: [place]: -options in [place] if you wish to engage in more social and exercise related activities:  -Silver sneakers https://tools.silversneakers.com  -Walk with a Doc: http://stephens-thompson.biz/  -Check out the [place] 50+ section on the [place] [employer] (hiking clubs, book clubs, cards and games, chess, exercise classes, aquatic classes and much more) - see the website for details: https://www.[place].gov/departments/parks-recreation/active-adults50  -YouTube has lots of exercise videos for different ages and abilities as well  -[place] (a variety of indoor and outdoor inperson activities for adults). 5873896807. [address]  -Virtual Online Classes (a variety of topics): see seniorplanet.org or call ([phone])[phone]  -consider volunteering at a school, hospice center, church, senior center or elsewhere           Lucretia Kern, DO

## 2022-05-21 NOTE — Patient Instructions (Addendum)
I really enjoyed getting to talk with you today! I am available on Tuesdays and Thursdays for virtual visits if you have any questions or concerns, or if I can be of any further assistance.   CHECKLIST FROM ANNUAL WELLNESS VISIT:  -Follow up (please call to schedule if not scheduled after visit):  -yearly for annual wellness visit with primary care office  Here is a list of your preventive care/health maintenance measures and the plan for each if any are due:  Health Maintenance  Topic Date Due   FOOT EXAM  Please request at upcoming appointment with your doctor   OPHTHALMOLOGY EXAM  Please schedule yearly diabetic eye exam with eye doctor   Diabetic kidney evaluation - Urine ACR  Can do labs at upcoming appointment with your doctor   Hepatitis C Screening  Can do labs at upcoming appointment with your doctor   COLONOSCOPY (Pts 45-47yr Insurance coverage will need to be confirmed)  Advised nurse to request report, if you get a copy - please bring a copy to our office   Lung Cancer Screening  Should be done year, Advised nurse to request report, if you get a copy - please bring a copy to our office   MAMMOGRAM  Should be done yearly, Advised nurse to request report, if you get a copy - please bring a copy to our office   Diabetic kidney evaluation - eGFR measurement  Can do labs at upcoming appointment with your doctor   INFLUENZA VACCINE  10/21/2021, can do at pharmacy   COVID-19 Vaccine (6 - 2023-24 season) 11/21/2021, can do at pharmacy   HEMOGLOBIN A1C  10/20/2022   Medicare Annual Wellness (AWV)  05/21/2023   PAP SMEAR-Modifier  02/20/2024   DTaP/Tdap/Td (3 - Td or Tdap) 09/11/2028   HIV Screening  Completed   Zoster Vaccines- Shingrix  Completed   HPV VACCINES  Aged Out    -See a dentist at least yearly  -Get your eyes checked and then per your eye specialist's recommendations  -Other issues addressed today: -Please quit smoking when able - let uKoreaknow if you need any  help.  -I have included below further information regarding a healthy whole foods based diet, physical activity guidelines for adults, stress management and opportunities for social connections. I hope you find this information useful.   -----------------------------------------------------------------------------------------------------------------------------------------------------------------------------------------------------------------------------------------------------------  NUTRITION: -eat real food: lots of colorful vegetables (half the plate) and fruits -5-7 servings of vegetables and fruits per day (fresh or steamed is best), exp. 2 servings of vegetables with lunch and dinner and 2 servings of fruit per day. Berries and greens such as kale and collards are great choices.  -consume on a regular basis: whole grains (make sure first ingredient on label contains the word "whole"), fresh fruits, fish, nuts, seeds, healthy oils (such as olive oil, avocado oil, grape seed oil) -may eat small amounts of dairy and lean meat on occasion, but avoid processed meats such as ham, bacon, lunch meat, etc. -drink water -try to avoid fast food and pre-packaged foods, processed meat -most experts advise limiting sodium to < '2300mg'$  per day, should limit further is any chronic conditions such as high blood pressure, heart disease, diabetes, etc. The American Heart Association advised that < '1500mg'$  is is ideal -try to avoid foods that contain any ingredients with names you do not recognize  -try to avoid sugar/sweets (except for the natural sugar that occurs in fresh fruit) -try to avoid sweet drinks -try to avoid white  rice, white bread, pasta (unless whole grain), white or yellow potatoes  EXERCISE GUIDELINES FOR ADULTS: -if you wish to increase your physical activity, do so gradually and with the approval of your doctor -STOP and seek medical care immediately if you have any chest pain, chest  discomfort or trouble breathing when starting or increasing exercise  -move and stretch your body, legs, feet and arms when sitting for long periods -Physical activity guidelines for optimal health in adults: -least 150 minutes per week of aerobic exercise (can talk, but not sing) once approved by your doctor, 20-30 minutes of sustained activity or two 10 minute episodes of sustained activity every day.  -resistance training at least 2 days per week if approved by your doctor -balance exercises 3+ days per week:   Stand somewhere where you have something sturdy to hold onto if you lose balance.    1) lift up on toes, start with 5x per day and work up to 20x   2) stand and lift on leg straight out to the side so that foot is a few inches of the floor, start with 5x each side and work up to 20x each side   3) stand on one foot, start with 5 seconds each side and work up to 20 seconds on each side  If you need ideas or help with getting more active:  -Silver sneakers https://tools.silversneakers.com  -Walk with a Doc: http://stephens-thompson.biz/  -try to include resistance (weight lifting/strength building) and balance exercises twice per week: or the following link for ideas: ChessContest.fr  UpdateClothing.com.cy  STRESS MANAGEMENT: -can try meditating, or just sitting quietly with deep breathing while intentionally relaxing all parts of your body for 5 minutes daily -if you need further help with stress, anxiety or depression please follow up with your primary doctor or contact the wonderful folks at [facility]: Maish Vaya: -options in [place] if you wish to engage in more social and exercise related activities:  -Silver sneakers https://tools.silversneakers.com  -Walk with a Doc: http://stephens-thompson.biz/  -Check out the [place] 50+  section on the [place] [employer] (hiking clubs, book clubs, cards and games, chess, exercise classes, aquatic classes and much more) - see the website for details: https://www.[place].gov/departments/parks-recreation/active-adults50  -YouTube has lots of exercise videos for different ages and abilities as well  -[employer] (a variety of indoor and outdoor inperson activities for adults). [phone]. [address].  -Virtual Online Classes (a variety of topics): see seniorplanet.org or call 9098549287  -consider volunteering at a school, hospice center, church, senior center or elsewhere

## 2022-05-27 LAB — HM COLONOSCOPY

## 2022-06-03 ENCOUNTER — Telehealth (HOSPITAL_COMMUNITY): Payer: Medicare HMO | Admitting: Psychiatry

## 2022-06-08 ENCOUNTER — Encounter (HOSPITAL_COMMUNITY): Payer: Self-pay | Admitting: Psychiatry

## 2022-06-08 ENCOUNTER — Telehealth (HOSPITAL_COMMUNITY): Payer: Medicare HMO | Admitting: Psychiatry

## 2022-06-08 DIAGNOSIS — F5102 Adjustment insomnia: Secondary | ICD-10-CM | POA: Diagnosis not present

## 2022-06-08 DIAGNOSIS — F4310 Post-traumatic stress disorder, unspecified: Secondary | ICD-10-CM

## 2022-06-08 DIAGNOSIS — F331 Major depressive disorder, recurrent, moderate: Secondary | ICD-10-CM | POA: Diagnosis not present

## 2022-06-08 DIAGNOSIS — F411 Generalized anxiety disorder: Secondary | ICD-10-CM | POA: Diagnosis not present

## 2022-06-08 DIAGNOSIS — R69 Illness, unspecified: Secondary | ICD-10-CM | POA: Diagnosis not present

## 2022-06-08 MED ORDER — DULOXETINE HCL 30 MG PO CPEP: 30.0000 mg | ORAL_CAPSULE | Freq: Every day | ORAL | 0 refills | Status: DC

## 2022-06-08 NOTE — Progress Notes (Signed)
[place] Follow up visit  Patient Identification: Sandra Bean MRN:  [id number] Date of Evaluation:  06/08/2022 Referral Source: primary care Chief Complaint:    Depression follow up  Visit Diagnosis:    ICD-10-CM   1. Major depressive disorder, recurrent episode, moderate (HCC)  F33.1     2. GAD (generalized anxiety disorder)  F41.1     3. PTSD (post-traumatic stress disorder)  F43.10     4. Adjustment insomnia  F51.02     Virtual Visit via Video Note  I connected with Sandra Bean on 06/08/22 at  4:30 PM EDT by a video enabled telemedicine application and verified that I am speaking with the correct person using two identifiers.  Location: Patient: home Provider: home office   I discussed the limitations of evaluation and management by telemedicine and the availability of in person appointments. The patient expressed understanding and agreed to proceed.     I discussed the assessment and treatment plan with the patient. The patient was provided an opportunity to ask questions and all were answered. The patient agreed with the plan and demonstrated an understanding of the instructions.   The patient was advised to call back or seek an in-person evaluation if the symptoms worsen or if the condition fails to improve as anticipated.  I provided 15 minutes of non-face-to-face time during this encounter.     History of Present Illness:  Ms. Sandra Bean is a 62  years old currently single African-American female initially referred by primary care physician for management of depression and PTSD.  Has been doing better but ending job , did 16 months, company finances not stable Handling it fair and plans to move on  Meds are the same no side effects  Remains positive or to get some positive out of this  Aggravating factors; multiple medical issues including diabetes.  Abuse history Past jobs Modifying factors: dog  Duration  20 plus years Severity; manageable   Past Psychiatric  History: depression    Past Medical History:  Past Medical History:  Diagnosis Date   Anemia    Anxiety    PTDS   Arthritis    RIGHT SIDE   COPD (chronic obstructive pulmonary disease) ([place])    Depression    Diabetes mellitus without complication (HCC)    Type 2   Fibroids    Fibromyalgia    DAILY PAIN   GERD (gastroesophageal reflux disease)    H/O hernia repair 2009   Headache    Hypertension    Hyperthyroidism    NON PER PATIENT   Mental disorder    Mitral valve prolapse    Ovarian cyst     Past Surgical History:  Procedure Laterality Date   ABDOMINAL HYSTERECTOMY     BREAST SURGERY     FOOT SURGERY     HERNIA REPAIR     LAPAROTOMY Bilateral 01/19/2017   Procedure: MINI-LAPAROTOMY TO REMOVE UTERUS, BILATERAL FALLOPIAN TUBES AND BILATERAL OVARIES;  Surgeon: Lavonia Drafts, MD;  Location: [facility];  Service: Gynecology;  Laterality: Bilateral;   NOSE SURGERY     ROBOTIC ASSISTED TOTAL HYSTERECTOMY WITH BILATERAL SALPINGO OOPHERECTOMY Bilateral 01/19/2017   Procedure: ROBOTIC ASSISTED TOTAL LAPAROSCOPIC SUPRACERVICAL HYSTERECTOMY WITH BILATERAL SALPINGO OOPHORECTOMY WITH MINI LAPAROTOMY TO REMOVE SPECIMEN;  Surgeon: Lavonia Drafts, MD;  Location: [facility];  Service: Gynecology;  Laterality: Bilateral;   THERAPEUTIC ABORTION      Family Psychiatric History: Parents : alcoholic  Family History:  Family History  Problem Relation Age  of Onset   Depression Mother    Alcohol abuse Mother    Hypertension Father    Alcohol abuse Sister    Diabetes Sister    Hypertension Sister    Diabetes Brother    Hypertension Brother     Social History:   Social History   Socioeconomic History   Marital status: Divorced    Spouse name: Not on file   Number of children: Not on file   Years of education: Not on file   Highest education level: Not on file  Occupational History   Not on file  Tobacco Use   Smoking status: Every Day    Packs/day: 1    Types:  Cigarettes   Smokeless tobacco: Never  Vaping Use   Vaping Use: Never used  Substance and Sexual Activity   Alcohol use: No   Drug use: No   Sexual activity: Not Currently    Birth control/protection: Post-menopausal  Other Topics Concern   Not on file  Social History Narrative   Not on file   Social Determinants of Health   Financial Resource Strain: Not on file  Food Insecurity: Not on file  Transportation Needs: Not on file  Physical Activity: Not on file  Stress: Not on file  Social Connections: Not on file     Allergies:   Allergies  Allergen Reactions   Famotidine Nausea Only   Mirtazapine Other (See Comments)    Overly drowsy, couldn't function     Metabolic Disorder Labs: Lab Results  Component Value Date   HGBA1C 5.8 (A) 04/21/2022   No results found for: "PROLACTIN" No results found for: "CHOL", "TRIG", "HDL", "CHOLHDL", "VLDL", "LDLCALC" No results found for: "TSH"  Therapeutic Level Labs: No results found for: "LITHIUM" No results found for: "CBMZ" No results found for: "VALPROATE"  Current Medications: Current Outpatient Medications  Medication Sig Dispense Refill   Accu-Chek FastClix Lancets MISC Check blood sugar once daily.     amLODipine (NORVASC) 5 MG tablet Take 1 tablet (5 mg total) by mouth every morning. 90 tablet 3   atorvastatin (LIPITOR) 20 MG tablet Take 1 tablet (20 mg total) by mouth daily. 90 tablet 3   desvenlafaxine (PRISTIQ) 100 MG 24 hr tablet Take 1 tablet (100 mg total) by mouth daily. 90 tablet 0   diclofenac (VOLTAREN) 75 MG EC tablet Take 1 tablet (75 mg total) by mouth 2 (two) times daily. 180 tablet 1   DULoxetine (CYMBALTA) 30 MG capsule Take 1 capsule (30 mg total) by mouth daily. 90 capsule 0   ferrous gluconate (FERGON) 324 MG tablet      glucose blood (ACCU-CHEK GUIDE) test strip Check blood sugar once daily.     losartan-hydrochlorothiazide (HYZAAR) 100-25 MG tablet Take 1 tablet by mouth daily. 90 tablet 3    metoprolol succinate (TOPROL-XL) 50 MG 24 hr tablet Take 1 tablet (50 mg total) by mouth daily. 90 tablet 3   Multiple Vitamin (MULTIVITAMIN IRON-FREE) TABS Take 1 tablet by mouth daily.     Multiple Vitamins-Minerals (HAIR SKIN AND NAILS FORMULA) TABS Take 1 tablet by mouth daily.      Omega-3 Fatty Acids (FISH OIL) 1000 MG CAPS Take by mouth.     omeprazole (PRILOSEC) 20 MG capsule Take 1 capsule (20 mg total) by mouth daily. 90 capsule 1   traZODone (DESYREL) 100 MG tablet TAKE 2 TABLETS BY MOUTH AT  BEDTIME AS NEEDED FOR SLEEP 60 tablet 11   vitamin C (ASCORBIC ACID)  500 MG tablet Take 500 mg by mouth daily.     vitamin E 1000 UNIT capsule Take by mouth.     No current facility-administered medications for this visit.     Psychiatric Specialty Exam: Review of Systems  Cardiovascular:  Negative for chest pain.  Psychiatric/Behavioral:  Negative for substance abuse and suicidal ideas.     There were no vitals taken for this visit.There is no height or weight on file to calculate BMI.  General Appearance: Casual  Eye Contact:  Fair  Speech:  Normal Rate  Volume:  Normal  Mood:fair  Affect:  Congruent  Thought Process:  Goal Directed  Orientation:  Full (Time, Place, and Person)  Thought Content:  Logical  Suicidal Thoughts:  No  Homicidal Thoughts:  No  Memory:  Immediate;   Fair Recent;   Fair  Judgement:  Fair  Insight:  Fair  Psychomotor Activity:  Normal  Concentration:  Concentration: Fair and Attention Span: Fair  Recall:  [employer] of Knowledge:Fair  Language: Fair  Akathisia:  No  Handed:  Right  AIMS (if indicated):  not done  Assets:  Desire for Improvement  ADL's:  intact  Cognition: WNL  Sleep:  Fair   Screenings: GAD-7    [place] Office Visit from 10/29/2016 in [place] for [facility]  Total GAD-7 Score 20      PHQ2-9    [place] Visit from 05/21/2022 in [place] [facility]  from 10/29/2016 in [place] for [facility]  PHQ-2 Total Score 6 6  PHQ-9 Total Score 21 24      Flowsheet Row Video Visit from 02/06/2022 in [place] at [facility] Video Visit from 10/03/2021 in [place] at [facility] Video Visit from 06/13/2021 in [place] at [place] No Risk No Risk No Risk       Assessment and Plan: as follows Prior documentation reviewed   MDD moderate to severe: fair continue cymbalta, pristiq  GAD/ PTSD: managing it despite current stressors, continue meds as above   Insomnia: fair on trazadone, has refills  Fu 39m.  Merian Capron, MD 3/18/20244:36 PM

## 2022-06-30 DIAGNOSIS — E119 Type 2 diabetes mellitus without complications: Secondary | ICD-10-CM | POA: Diagnosis not present

## 2022-07-05 DIAGNOSIS — Z0100 Encounter for examination of eyes and vision without abnormal findings: Secondary | ICD-10-CM | POA: Diagnosis not present

## 2022-07-21 ENCOUNTER — Encounter: Payer: Medicare HMO | Admitting: Family Medicine

## 2022-07-29 DIAGNOSIS — D509 Iron deficiency anemia, unspecified: Secondary | ICD-10-CM | POA: Diagnosis not present

## 2022-07-29 DIAGNOSIS — R040 Epistaxis: Secondary | ICD-10-CM | POA: Diagnosis not present

## 2022-07-29 DIAGNOSIS — I10 Essential (primary) hypertension: Secondary | ICD-10-CM | POA: Diagnosis not present

## 2022-07-29 DIAGNOSIS — F17219 Nicotine dependence, cigarettes, with unspecified nicotine-induced disorders: Secondary | ICD-10-CM | POA: Diagnosis not present

## 2022-07-31 ENCOUNTER — Encounter: Payer: Self-pay | Admitting: Family Medicine

## 2022-07-31 ENCOUNTER — Ambulatory Visit (INDEPENDENT_AMBULATORY_CARE_PROVIDER_SITE_OTHER): Payer: Medicare HMO | Admitting: Family Medicine

## 2022-07-31 VITALS — BP 150/105 | HR 72 | Temp 98.7°F | Ht 65.35 in | Wt 170.7 lb

## 2022-07-31 DIAGNOSIS — I10 Essential (primary) hypertension: Secondary | ICD-10-CM

## 2022-07-31 DIAGNOSIS — F17200 Nicotine dependence, unspecified, uncomplicated: Secondary | ICD-10-CM | POA: Diagnosis not present

## 2022-07-31 DIAGNOSIS — R7303 Prediabetes: Secondary | ICD-10-CM | POA: Diagnosis not present

## 2022-07-31 DIAGNOSIS — Z0001 Encounter for general adult medical examination with abnormal findings: Secondary | ICD-10-CM | POA: Diagnosis not present

## 2022-07-31 LAB — POCT GLYCOSYLATED HEMOGLOBIN (HGB A1C): Hemoglobin A1C: 6.1 % — AB (ref 4.0–5.6)

## 2022-07-31 MED ORDER — AMLODIPINE BESYLATE 10 MG PO TABS: 10.0000 mg | ORAL_TABLET | Freq: Every morning | ORAL | 1 refills | Status: DC

## 2022-07-31 NOTE — Progress Notes (Unsigned)
Complete physical exam  Patient: Sandra Bean   DOB: Jun 11, 1960   62 y.o. Female  MRN: [id number]  Subjective:    Chief Complaint  Patient presents with   Annual Exam    Sandra Bean is a 62 y.o. female who presents today for a complete physical exam. She reports consuming a general diet. Home exercise routine includes patient walks every day with her dog. She generally feels see discussion below. She reports sleeping fairly well. She does not have additional problems to discuss today.   Patient reports she lost her job at the end of March. States that this is the reason for her elevated PHQ, she just saw her psychiatrist in March, she states that each day is getting worse so she knows to call him, states she think she needs to see a therapist to help. Pt states she has been in therapy in the past, states she hasn't found a good fit yet.   HTN-- BP is elevated today. I rechecked in office and remains elevated. Pt is going through a significant amount of stress and anxiety, see above. We discussed medications.   Most recent fall risk assessment:    05/21/2022    3:52 PM  Fall Risk   Falls in the past year? 0  Number falls in past yr: 0  Injury with Fall? 0     Most recent depression screenings:    05/21/2022    3:52 PM 10/29/2016    2:51 PM  PHQ 2/9 Scores  PHQ - 2 Score 6 6  PHQ- 9 Score 21 24    Vision:Within last year and had visit on April 9th, [facility] Dental: No current dental problems and Receives regular dental care  Patient Active Problem List   Diagnosis Date Noted   Prediabetes 07/31/2022   Arthritis of shoulder region, left 04/21/2022   Hyperlipidemia LDL goal <100 09/12/2018   Varicose veins with pain 08/31/2018   IDA (iron deficiency anemia) 08/17/2018   History of partial hysterectomy 02/03/2017   Pelvic peritoneal adhesions, female 01/19/2017   Anemia due to chronic illness 01/19/2017   Benign essential hypertension 01/19/2017    Tobacco abuse 01/19/2017   Post-operative state 01/19/2017   Cigarette nicotine dependence with nicotine-induced disorder 01/19/2017   Fibroid uterus 10/26/2016   Abdominal pain in female 10/26/2016   Bacterial vaginitis 10/26/2016   Anxiety 10/12/2016   Depression 10/12/2016   PTSD (post-traumatic stress disorder) 10/12/2016      Patient Care Team: Karie Georges, MD as PCP - General (Family Medicine)   Outpatient Medications Prior to Visit  Medication Sig   Accu-Chek FastClix Lancets MISC Check blood sugar once daily.   amLODipine (NORVASC) 5 MG tablet Take 1 tablet (5 mg total) by mouth every morning.   atorvastatin (LIPITOR) 20 MG tablet Take 1 tablet (20 mg total) by mouth daily.   desvenlafaxine (PRISTIQ) 100 MG 24 hr tablet Take 1 tablet (100 mg total) by mouth daily.   diclofenac (VOLTAREN) 75 MG EC tablet Take 1 tablet (75 mg total) by mouth 2 (two) times daily.   DULoxetine (CYMBALTA) 30 MG capsule Take 1 capsule (30 mg total) by mouth daily.   ferrous gluconate (FERGON) 324 MG tablet    glucose blood (ACCU-CHEK GUIDE) test strip Check blood sugar once daily.   losartan-hydrochlorothiazide (HYZAAR) 100-25 MG tablet Take 1 tablet by mouth daily.   metoprolol succinate (TOPROL-XL) 50 MG 24 hr tablet Take 1 tablet (50  mg total) by mouth daily.   Multiple Vitamin (MULTIVITAMIN IRON-FREE) TABS Take 1 tablet by mouth daily.   Multiple Vitamins-Minerals (HAIR SKIN AND NAILS FORMULA) TABS Take 1 tablet by mouth daily.    Omega-3 Fatty Acids (FISH OIL) 1000 MG CAPS Take by mouth.   omeprazole (PRILOSEC) 20 MG capsule Take 1 capsule (20 mg total) by mouth daily.   traZODone (DESYREL) 100 MG tablet TAKE 2 TABLETS BY MOUTH AT  BEDTIME AS NEEDED FOR SLEEP   vitamin C (ASCORBIC ACID) 500 MG tablet Take 500 mg by mouth daily.   vitamin E 1000 UNIT capsule Take by mouth.   No facility-administered medications prior to visit.    Review of Systems  HENT:  Negative for hearing  loss.   Eyes:  Negative for blurred vision.  Respiratory:  Negative for shortness of breath.   Cardiovascular:  Negative for chest pain.  Gastrointestinal: Negative.   Genitourinary: Negative.   Musculoskeletal:  Negative for back pain.  Neurological:  Negative for headaches.  Psychiatric/Behavioral:  Positive for depression. The patient is nervous/anxious.        Objective:     BP (!) 150/90 (BP Location: Left Arm, Patient Position: Sitting, Cuff Size: Normal)   Pulse 72   Temp 98.7 F (37.1 C) (Oral)   Ht 5' 5.35" (1.66 m)   Wt 170 lb 11.2 oz (77.4 kg)   SpO2 97%   BMI 28.10 kg/m  BP Readings from Last 3 Encounters:  07/31/22 (!) 150/90  02/19/21 (!) 150/78  12/08/19 (!) 143/80      Physical Exam Vitals reviewed.  Constitutional:      Appearance: Normal appearance. She is well-groomed and normal weight.  Eyes:     Conjunctiva/sclera: Conjunctivae normal.  Neck:     Thyroid: No thyromegaly.  Cardiovascular:     Rate and Rhythm: Normal rate and regular rhythm.     Pulses: Normal pulses.     Heart sounds: S1 normal and S2 normal.  Pulmonary:     Effort: Pulmonary effort is normal.     Breath sounds: Normal breath sounds and air entry.  Abdominal:     General: Bowel sounds are normal.  Musculoskeletal:     Right lower leg: No edema.     Left lower leg: No edema.  Neurological:     Mental Status: She is alert and oriented to person, place, and time. Mental status is at baseline.     Gait: Gait is intact.  Psychiatric:        Mood and Affect: Mood and affect normal.        Speech: Speech normal.        Behavior: Behavior normal.        Judgment: Judgment normal.      No results found for any visits on 07/31/22. {Show previous labs (optional):23779}    Assessment & Plan:    Routine Health Maintenance and Physical Exam  Immunization History  Administered Date(s) Administered   Influenza, Seasonal, Injecte, Preservative Fre 01/20/2017    Influenza,inj,Quad PF,6+ Mos 01/20/2017   Influenza-Unspecified 01/24/2018, 12/13/2018   PFIZER(Purple Top)SARS-COV-2 Vaccination 11/19/2019, 06/30/2020   Pfizer Covid-19 Vaccine Bivalent Booster 57yrs & up 12/21/2020   Pneumococcal Polysaccharide-23 10/12/2016   Td 09/12/2018   Tdap 03/23/2008    Health Maintenance  Topic Date Due   FOOT EXAM  Never done   OPHTHALMOLOGY EXAM  Never done   Diabetic kidney evaluation - Urine ACR  Never done   Hepatitis C  Screening  Never done   Diabetic kidney evaluation - eGFR measurement  07/23/2019   COVID-19 Vaccine (6 - 2023-24 season) 11/21/2021   Lung Cancer Screening  01/27/2022   HEMOGLOBIN A1C  10/20/2022   INFLUENZA VACCINE  10/22/2022   Medicare Annual Wellness (AWV)  05/21/2023   MAMMOGRAM  06/27/2023   PAP SMEAR-Modifier  02/20/2024   DTaP/Tdap/Td (3 - Td or Tdap) 09/11/2028   COLONOSCOPY (Pts 45-26yrs Insurance coverage will need to be confirmed)  05/26/2032   HIV Screening  Completed   Zoster Vaccines- Shingrix  Completed   HPV VACCINES  Aged Out    Discussed health benefits of physical activity, and encouraged her to engage in regular exercise appropriate for her age and condition.  Prediabetes -     POCT glycosylated hemoglobin (Hb A1C) -     Lipid panel  Benign essential hypertension -     Lipid panel  Tobacco use disorder -     CT CHEST LUNG CANCER SCREENING LOW DOSE WO CONTRAST; Future    No follow-ups on file.     Karie Georges, MD

## 2022-07-31 NOTE — Patient Instructions (Signed)

## 2022-08-01 LAB — LIPID PANEL
Cholesterol: 124 mg/dL (ref <200)
HDL: 59 mg/dL (ref >=50)
LDL Cholesterol (Calc): 49 mg/dL (calc)
Non-HDL Cholesterol (Calc): 65 mg/dL (calc) (ref <130)
Total CHOL/HDL Ratio: 2.1 (calc) (ref <5.0)
Triglycerides: 78 mg/dL (ref <150)

## 2022-08-04 NOTE — Assessment & Plan Note (Signed)
>>  ASSESSMENT AND PLAN FOR PREDIABETES WRITTEN ON 08/04/2022  2:28 PM BY Aida House, MD  Pt is not on any medication for this, I do not see any A1C's that are higher than 6.5, I believe she is not a full diabetic, just has prediabetes. I have updated her problem list to reflect this.

## 2022-08-04 NOTE — Assessment & Plan Note (Signed)
Pt is not on any medication for this, I do not see any A1C's that are higher than 6.5, I believe she is not a full diabetic, just has prediabetes. I have updated her problem list to reflect this.

## 2022-08-04 NOTE — Assessment & Plan Note (Signed)
BP is very elevated today, I advised that we increase her amlodipine to 10 mg daily, at least temporarily until her stress levels improve. She will continue the losartan HCT 100/25 daily as well. I will have her back in 3 months for a BP recheck

## 2022-09-07 ENCOUNTER — Ambulatory Visit (HOSPITAL_BASED_OUTPATIENT_CLINIC_OR_DEPARTMENT_OTHER)
Admission: RE | Admit: 2022-09-07 | Discharge: 2022-09-07 | Disposition: A | Discharge status: Home / Self Care | Payer: Medicare HMO | Source: Ambulatory Visit | Attending: Family Medicine | Admitting: Family Medicine

## 2022-09-07 DIAGNOSIS — J438 Other emphysema: Secondary | ICD-10-CM | POA: Insufficient documentation

## 2022-09-07 DIAGNOSIS — J432 Centrilobular emphysema: Secondary | ICD-10-CM | POA: Diagnosis not present

## 2022-09-07 DIAGNOSIS — I700 Atherosclerosis of aorta: Secondary | ICD-10-CM | POA: Insufficient documentation

## 2022-09-07 DIAGNOSIS — F17210 Nicotine dependence, cigarettes, uncomplicated: Secondary | ICD-10-CM | POA: Diagnosis not present

## 2022-09-07 DIAGNOSIS — Z122 Encounter for screening for malignant neoplasm of respiratory organs: Secondary | ICD-10-CM | POA: Insufficient documentation

## 2022-09-07 DIAGNOSIS — F17200 Nicotine dependence, unspecified, uncomplicated: Secondary | ICD-10-CM | POA: Insufficient documentation

## 2022-09-09 ENCOUNTER — Telehealth (HOSPITAL_COMMUNITY): Payer: Medicare HMO | Admitting: Psychiatry

## 2022-09-09 ENCOUNTER — Encounter (HOSPITAL_COMMUNITY): Payer: Self-pay

## 2022-09-09 ENCOUNTER — Encounter (HOSPITAL_COMMUNITY): Payer: Self-pay | Admitting: Psychiatry

## 2022-10-21 ENCOUNTER — Ambulatory Visit: Payer: Medicare HMO | Admitting: Obstetrics & Gynecology

## 2022-10-30 ENCOUNTER — Ambulatory Visit: Payer: Medicare HMO | Admitting: Family Medicine

## 2022-12-15 ENCOUNTER — Ambulatory Visit (INDEPENDENT_AMBULATORY_CARE_PROVIDER_SITE_OTHER): Payer: Medicare HMO | Admitting: Family Medicine

## 2022-12-15 ENCOUNTER — Ambulatory Visit: Payer: Medicare HMO

## 2022-12-15 ENCOUNTER — Encounter: Payer: Self-pay | Admitting: Family Medicine

## 2022-12-15 VITALS — BP 152/96 | HR 75 | Temp 98.8°F | Ht 66.0 in | Wt 179.8 lb

## 2022-12-15 DIAGNOSIS — M160 Bilateral primary osteoarthritis of hip: Secondary | ICD-10-CM | POA: Diagnosis not present

## 2022-12-15 DIAGNOSIS — I10 Essential (primary) hypertension: Secondary | ICD-10-CM

## 2022-12-15 DIAGNOSIS — M25551 Pain in right hip: Secondary | ICD-10-CM

## 2022-12-15 DIAGNOSIS — F332 Major depressive disorder, recurrent severe without psychotic features: Secondary | ICD-10-CM

## 2022-12-15 MED ORDER — PREDNISONE 20 MG PO TABS: ORAL_TABLET | ORAL | 0 refills | Status: AC

## 2022-12-15 MED ORDER — AMLODIPINE BESYLATE 10 MG PO TABS: 10.0000 mg | ORAL_TABLET | Freq: Every morning | ORAL | 1 refills | Status: DC

## 2022-12-15 MED ORDER — CYCLOBENZAPRINE HCL 10 MG PO TABS: 10.0000 mg | ORAL_TABLET | Freq: Three times a day (TID) | ORAL | 0 refills | Status: DC | PRN

## 2022-12-15 MED ORDER — ARIPIPRAZOLE 5 MG PO TABS: 5.0000 mg | ORAL_TABLET | Freq: Every day | ORAL | 0 refills | Status: DC

## 2022-12-15 NOTE — Patient Instructions (Signed)
Ask your [facility] provider about a mood stabilizer to be added to the pristique

## 2022-12-15 NOTE — Assessment & Plan Note (Addendum)
Chronic, uncontrolled, pt is in pain and still scoring very high on her PHQ, also has not yet taken her BP meds today.  I advised that we should see her back in 2 months for another BP check to be sure. Patient is scoring rather high on her PHQ despite treatment with antidepressants and she is under stress.

## 2022-12-18 DIAGNOSIS — M25551 Pain in right hip: Secondary | ICD-10-CM | POA: Insufficient documentation

## 2022-12-18 NOTE — Assessment & Plan Note (Addendum)
Could be SI joint pain vs sciatica, will start work up with X-rays of the hip, will give pt a trial of muscle relaxers and I gave her a prednisone taper  to help reduce inflammation and improve pain.

## 2022-12-18 NOTE — Assessment & Plan Note (Signed)
Severe and refractory, pt lost her job some time ago and despite her best efforts has not been able to secure employment, she does have a psychiatrist however she does not have an appt with them for some time. I feel that the situation right now is rather urgent and so

## 2022-12-23 ENCOUNTER — Telehealth (INDEPENDENT_AMBULATORY_CARE_PROVIDER_SITE_OTHER): Payer: Medicare HMO | Admitting: Psychiatry

## 2022-12-23 ENCOUNTER — Encounter (HOSPITAL_COMMUNITY): Payer: Self-pay | Admitting: Psychiatry

## 2022-12-23 DIAGNOSIS — F411 Generalized anxiety disorder: Secondary | ICD-10-CM

## 2022-12-23 DIAGNOSIS — F5102 Adjustment insomnia: Secondary | ICD-10-CM

## 2022-12-23 DIAGNOSIS — F4310 Post-traumatic stress disorder, unspecified: Secondary | ICD-10-CM | POA: Diagnosis not present

## 2022-12-23 DIAGNOSIS — F331 Major depressive disorder, recurrent, moderate: Secondary | ICD-10-CM

## 2022-12-23 DIAGNOSIS — F332 Major depressive disorder, recurrent severe without psychotic features: Secondary | ICD-10-CM | POA: Diagnosis not present

## 2022-12-23 MED ORDER — DESVENLAFAXINE SUCCINATE ER 100 MG PO TB24: 100.0000 mg | ORAL_TABLET | Freq: Every day | ORAL | 0 refills | Status: DC

## 2022-12-23 MED ORDER — DULOXETINE HCL 30 MG PO CPEP
30.0000 mg | ORAL_CAPSULE | Freq: Every day | ORAL | 0 refills | Status: DC
Start: 2022-12-23 — End: 2023-05-05

## 2022-12-23 MED ORDER — ARIPIPRAZOLE 5 MG PO TABS: 5.0000 mg | ORAL_TABLET | Freq: Every day | ORAL | 1 refills | Status: DC

## 2022-12-23 NOTE — Progress Notes (Signed)
[facility] Follow up visit  Patient Identification: Sandra Bean MRN:  [id number] Date of Evaluation:  12/23/2022 Referral Source: primary care Chief Complaint:    Depression follow up  Visit Diagnosis:    ICD-10-CM   1. Major depressive disorder, recurrent episode, moderate (HCC)  F33.1     2. GAD (generalized anxiety disorder)  F41.1     3. PTSD (post-traumatic stress disorder)  F43.10     4. Adjustment insomnia  F51.02     5. Severe episode of recurrent major depressive disorder, without psychotic features (HCC)  F33.2 ARIPiprazole (ABILIFY) 5 MG tablet    Virtual Visit via Video Note  I connected with Sandra Bean on 12/23/22 at  8:00 AM EDT by a video enabled telemedicine application and verified that I am speaking with the correct person using two identifiers.  Location: Patient: home Provider: home office   I discussed the limitations of evaluation and management by telemedicine and the availability of in person appointments. The patient expressed understanding and agreed to proceed.      I discussed the assessment and treatment plan with the patient. The patient was provided an opportunity to ask questions and all were answered. The patient agreed with the plan and demonstrated an understanding of the instructions.   The patient was advised to call back or seek an in-person evaluation if the symptoms worsen or if the condition fails to improve as anticipated.  I provided 20 minutes of non-face-to-face time during this encounter.       History of Present Illness:  Sandra Bean is a 62  years old currently single African-American female initially referred by primary care physician for management of depression and PTSD.  Missed last appointment. Feeling subdued, lonliness and financially strained. 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 This has helped her to give purpose and she is looking  forward to that Worried of social security cut off as last she made more and has an hearing with them  Some better since looking forward to the job and finding a purpose Aggravating factors; multiple medical issues including diabetes.  Abuse history Past jobs, finances Modifying factors: dog  Duration  20 plus years Severity; subdued   Past Psychiatric History: depression    Past Medical History:  Past Medical History:  Diagnosis Date   Anemia    Anxiety    PTDS   Arthritis    RIGHT SIDE   COPD (chronic obstructive pulmonary disease) (HCC)    Depression    Diabetes mellitus without complication (HCC)    Type 2   Fibroids    Fibromyalgia    DAILY PAIN   GERD (gastroesophageal reflux disease)    H/O hernia repair 2009   Headache    Hypertension    Hyperthyroidism    NON PER PATIENT   Mental disorder    Mitral valve prolapse    Ovarian cyst     Past Surgical History:  Procedure Laterality Date   ABDOMINAL HYSTERECTOMY     BREAST SURGERY     FOOT SURGERY     HERNIA REPAIR     LAPAROTOMY Bilateral 01/19/2017   Procedure: MINI-LAPAROTOMY TO REMOVE UTERUS, BILATERAL FALLOPIAN TUBES AND BILATERAL OVARIES;  Surgeon: Willodean Rosenthal, MD;  Location: [facility];  Service: Gynecology;  Laterality: Bilateral;   NOSE SURGERY     ROBOTIC ASSISTED TOTAL HYSTERECTOMY WITH BILATERAL SALPINGO OOPHERECTOMY Bilateral 01/19/2017  Procedure: ROBOTIC ASSISTED TOTAL LAPAROSCOPIC SUPRACERVICAL HYSTERECTOMY WITH BILATERAL SALPINGO OOPHORECTOMY WITH MINI LAPAROTOMY TO REMOVE SPECIMEN;  Surgeon: Willodean Rosenthal, MD;  Location: [facility];  Service: Gynecology;  Laterality: Bilateral;   THERAPEUTIC ABORTION      Family Psychiatric History: Parents : alcoholic  Family History:  Family History  Problem Relation Age of Onset   Depression Mother    Alcohol abuse Mother    Hypertension Father    Alcohol abuse Sister    Diabetes Sister    Hypertension Sister    Diabetes Brother     Hypertension Brother     Social History:   Social History   Socioeconomic History   Marital status: Divorced    Spouse name: Not on file   Number of children: Not on file   Years of education: Not on file   Highest education level: Not on file  Occupational History   Not on file  Tobacco Use   Smoking status: Every Day    Current packs/day: 1.00    Types: Cigarettes   Smokeless tobacco: Never  Vaping Use   Vaping status: Never Used  Substance and Sexual Activity   Alcohol use: No   Drug use: No   Sexual activity: Not Currently    Birth control/protection: Post-menopausal  Other Topics Concern   Not on file  Social History Narrative   Not on file   Social Determinants of Health   Financial Resource Strain: Medium Risk (06/21/2020)   Received from [facility]   Overall Financial Resource Strain (CARDIA)    Difficulty of Paying Living Expenses: Somewhat hard  Food Insecurity: No Food Insecurity (04/24/2021)   Received from [facility], [facility]   Hunger Vital Sign    Worried About Running Out of Food in the Last Year: Never true    Ran Out of Food in the Last Year: Never true  Transportation Needs: No Transportation Needs (06/21/2020)   Received from [facility], [facility]   PRAPARE - Transportation    Lack of Transportation (Medical): No    Lack of Transportation (Non-Medical): No  Physical Activity: Sufficiently Active (06/21/2020)   Received [facility]   Exercise Vital Sign    Days of Exercise per Week: 3 days    Minutes of Exercise per Session: 60 min  Stress: Stress Concern Present (06/21/2020)   Received from [facility], [facility] - Occupational Stress Questionnaire    Feeling of Stress : Very much  Social Connections: Unknown (07/20/2021)   Received from [facility], [facility]   Social Network    Social Network: Not on file     Allergies:   Allergies   Allergen Reactions   Famotidine Nausea Only   Mirtazapine Other (See Comments)    Overly drowsy, couldn't function     Metabolic Disorder Labs: Lab Results  Component Value Date   HGBA1C 6.1 (A) 07/31/2022   No results found for: "PROLACTIN" Lab Results  Component Value Date   CHOL 124 07/31/2022   TRIG 78 07/31/2022   HDL 59 07/31/2022   CHOLHDL 2.1 07/31/2022   LDLCALC 49 07/31/2022   No results found for: "TSH"  Therapeutic Level Labs: No results found for: "LITHIUM" No results found for: "CBMZ" No results found for: "VALPROATE"  Current Medications: Current Outpatient Medications  Medication Sig Dispense Refill   Accu-Chek FastClix Lancets MISC Check blood sugar once daily.     amLODipine (  NORVASC) 10 MG tablet Take 1 tablet (10 mg total) by mouth every morning. 90 tablet 1   ARIPiprazole (ABILIFY) 5 MG tablet Take 1 tablet (5 mg total) by mouth daily. 30 tablet 1   atorvastatin (LIPITOR) 20 MG tablet Take 1 tablet (20 mg total) by mouth daily. 90 tablet 3   cyclobenzaprine (FLEXERIL) 10 MG tablet Take 1 tablet (10 mg total) by mouth 3 (three) times daily as needed for muscle spasms. 30 tablet 0   desvenlafaxine (PRISTIQ) 100 MG 24 hr tablet Take 1 tablet (100 mg total) by mouth daily. 90 tablet 0   diclofenac (VOLTAREN) 75 MG EC tablet Take 1 tablet (75 mg total) by mouth 2 (two) times daily. 180 tablet 1   DULoxetine (CYMBALTA) 30 MG capsule Take 1 capsule (30 mg total) by mouth daily. 90 capsule 0   ferrous gluconate (FERGON) 324 MG tablet      glucose blood (ACCU-CHEK GUIDE) test strip Check blood sugar once daily.     losartan-hydrochlorothiazide (HYZAAR) 100-25 MG tablet Take 1 tablet by mouth daily. 90 tablet 3   metoprolol succinate (TOPROL-XL) 50 MG 24 hr tablet Take 1 tablet (50 mg total) by mouth daily. 90 tablet 3   Multiple Vitamin (MULTIVITAMIN IRON-FREE) TABS Take 1 tablet by mouth daily.     Multiple Vitamins-Minerals (HAIR SKIN AND NAILS FORMULA) TABS  Take 1 tablet by mouth daily.      Omega-3 Fatty Acids (FISH OIL) 1000 MG CAPS Take by mouth.     omeprazole (PRILOSEC) 20 MG capsule Take 1 capsule (20 mg total) by mouth daily. 90 capsule 1   predniSONE (DELTASONE) 20 MG tablet Take 3 tablets (60 mg total) by mouth daily with breakfast for 2 days, THEN 2 tablets (40 mg total) daily with breakfast for 2 days, THEN 1 tablet (20 mg total) daily with breakfast for 2 days, THEN 0.5 tablets (10 mg total) daily with breakfast for 2 days. 13 tablet 0   traZODone (DESYREL) 100 MG tablet TAKE 2 TABLETS BY MOUTH AT  BEDTIME AS NEEDED FOR SLEEP 60 tablet 11   vitamin C (ASCORBIC ACID) 500 MG tablet Take 500 mg by mouth daily.     vitamin E 1000 UNIT capsule Take by mouth.     No current facility-administered medications for this visit.     Psychiatric Specialty Exam: Review of Systems  Cardiovascular:  Negative for chest pain.  Neurological:  Negative for tremors.  Psychiatric/Behavioral:  Positive for depression. Negative for substance abuse and suicidal ideas.     There were no vitals taken for this visit.There is no height or weight on file to calculate BMI.  General Appearance: Casual  Eye Contact:  Fair  Speech:  Normal Rate  Volume:  Normal  Mood: somewhat subdued  Affect:  Congruent  Thought Process:  Goal Directed  Orientation:  Full (Time, Place, and Person)  Thought Content:  Logical  Suicidal Thoughts:  No  Homicidal Thoughts:  No  Memory:  Immediate;   Fair Recent;   Fair  Judgement:  Fair  Insight:  Fair  Psychomotor Activity:  Normal  Concentration:  Concentration: Fair and Attention Span: Fair  Recall:  [employer] of Knowledge:Fair  Language: Fair  Akathisia:  No  Handed:  Right  AIMS (if indicated):  not done  Assets:  Desire for Improvement  ADL's:  intact  Cognition: WNL  Sleep:  Fair   Screenings: GAD-7    Flowsheet Row Office Visit from 10/29/2016  in [facility]  Total GAD-7  Score 20      PHQ2-9    Flowsheet Row Office Visit from 12/15/2022 in [facility] [place] Office Visit from 05/21/2022 in [facility] [place] Office Visit from 10/29/2016 in [facility]  PHQ-2 Total Score 6 6 6   PHQ-9 Total Score 27 21 24       Flowsheet Row Video Visit from 02/06/2022 in [facility] Video Visit from 10/03/2021 in [facility] at [facility] Video Visit from 06/13/2021 in [facility] at [facility]  C-SSRS RISK CATEGORY No Risk No Risk No Risk       Assessment and Plan: as follows  Prior documentation reviewed   MDD moderate to severe:  somewhat subdued due to circumstances but now has a purpose will keep meds same and also abilify . 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

## 2022-12-25 ENCOUNTER — Telehealth: Payer: Self-pay | Admitting: Family Medicine

## 2022-12-25 DIAGNOSIS — M5431 Sciatica, right side: Secondary | ICD-10-CM

## 2022-12-25 DIAGNOSIS — M25551 Pain in right hip: Secondary | ICD-10-CM

## 2022-12-25 NOTE — Telephone Encounter (Signed)
Pt checking on results from imaging last week. Advised not released yet

## 2022-12-28 NOTE — Telephone Encounter (Signed)
Pt called again to say she is in a lot of pain and needs someone to call her  with the results of her imaging, so that she knows what to do next. Please call Pt back at your earliest convenience.

## 2022-12-28 NOTE — Telephone Encounter (Signed)
Pt called again, mad that she has not received a call back, explained that the provider is seeing patient's today and return calls and they will call her as soon as they have a moment, says she will find another doctor that cares about her being in pain.

## 2022-12-29 NOTE — Telephone Encounter (Signed)
Please apologize to the patient, the results are still not back as the radiologist has not read the film yet. If she would like I can place a referral to sports medicine for an evaluation.

## 2022-12-29 NOTE — Telephone Encounter (Signed)
Sport medicine should be able to tell us where the pain is coming from exactly-- I do not think the pain is originating in the hip-- most like could be SI joint pain or sciatica coming from the lower back. They can offer her injections to help with the pain and decide which part of the body needs additional imaging. I have placed the referral

## 2022-12-29 NOTE — Telephone Encounter (Signed)
Spoke to pt and went over with pt on provider's advise. Verbalized understanding.   Pt states she would like go forward with the sport medicine referral.   Pt reports the medication that was prescribed to her only give her a minimal relief. The pain is radiating from R hip to low back. She added that now she is noticing some pain on L hip. She can't bend. Putting on socks, sitting on toilet seat is a chores.   Her concern is that same thing had happened to her ankle in the past, she wound up having to do a major surgery for her ankle.   Pt wants to know whether Dr. Casimiro Needle thinks it is best for her to get an MRI or would a referral to sport medicine a better option for her?  Please advise.

## 2022-12-29 NOTE — Addendum Note (Signed)
Addended by: Karie Georges on: 12/29/2022 01:00 PM   Modules accepted: Orders

## 2022-12-30 NOTE — Progress Notes (Unsigned)
    Sandra Bean D.[facility] [address] [place] [address] Phone: [phone]   Assessment and Plan:     There are no diagnoses linked to this encounter.  ***   Pertinent previous records reviewed include ***   Follow Up: ***     Subjective:   I, Sandra Bean, am serving as a Neurosurgeon for Doctor Sandra Bean  Chief Complaint: low back pain   HPI:   12/31/2022 Patient is a 62 year old female complaining of low back pain. Patient states  Relevant Historical Information: ***  Additional pertinent review of systems negative.   Current Outpatient Medications:    Accu-Chek FastClix Lancets MISC, Check blood sugar once daily., Disp: , Rfl:    amLODipine (NORVASC) 10 MG tablet, Take 1 tablet (10 mg total) by mouth every morning., Disp: 90 tablet, Rfl: 1   ARIPiprazole (ABILIFY) 5 MG tablet, Take 1 tablet (5 mg total) by mouth daily., Disp: 30 tablet, Rfl: 1   atorvastatin (LIPITOR) 20 MG tablet, Take 1 tablet (20 mg total) by mouth daily., Disp: 90 tablet, Rfl: 3   cyclobenzaprine (FLEXERIL) 10 MG tablet, Take 1 tablet (10 mg total) by mouth 3 (three) times daily as needed for muscle spasms., Disp: 30 tablet, Rfl: 0   desvenlafaxine (PRISTIQ) 100 MG 24 hr tablet, Take 1 tablet (100 mg total) by mouth daily., Disp: 90 tablet, Rfl: 0   diclofenac (VOLTAREN) 75 MG EC tablet, Take 1 tablet (75 mg total) by mouth 2 (two) times daily., Disp: 180 tablet, Rfl: 1   DULoxetine (CYMBALTA) 30 MG capsule, Take 1 capsule (30 mg total) by mouth daily., Disp: 90 capsule, Rfl: 0   ferrous gluconate (FERGON) 324 MG tablet, , Disp: , Rfl:    glucose blood (ACCU-CHEK GUIDE) test strip, Check blood sugar once daily., Disp: , Rfl:    losartan-hydrochlorothiazide (HYZAAR) 100-25 MG tablet, Take 1 tablet by mouth daily., Disp: 90 tablet, Rfl: 3   metoprolol succinate (TOPROL-XL) 50 MG 24 hr tablet, Take 1 tablet (50 mg total) by mouth daily., Disp: 90  tablet, Rfl: 3   Multiple Vitamin (MULTIVITAMIN IRON-FREE) TABS, Take 1 tablet by mouth daily., Disp: , Rfl:    Multiple Vitamins-Minerals (HAIR SKIN AND NAILS FORMULA) TABS, Take 1 tablet by mouth daily. , Disp: , Rfl:    Omega-3 Fatty Acids (FISH OIL) 1000 MG CAPS, Take by mouth., Disp: , Rfl:    omeprazole (PRILOSEC) 20 MG capsule, Take 1 capsule (20 mg total) by mouth daily., Disp: 90 capsule, Rfl: 1   traZODone (DESYREL) 100 MG tablet, TAKE 2 TABLETS BY MOUTH AT  BEDTIME AS NEEDED FOR SLEEP, Disp: 60 tablet, Rfl: 11   vitamin C (ASCORBIC ACID) 500 MG tablet, Take 500 mg by mouth daily., Disp: , Rfl:    vitamin E 1000 UNIT capsule, Take by mouth., Disp: , Rfl:    Objective:     There were no vitals filed for this visit.    There is no height or weight on file to calculate BMI.    Physical Exam:    ***   Electronically signed by:  Sandra Bean D.[facility] 3:34 PM 12/30/22

## 2022-12-31 ENCOUNTER — Ambulatory Visit: Payer: Medicare HMO

## 2022-12-31 ENCOUNTER — Ambulatory Visit (INDEPENDENT_AMBULATORY_CARE_PROVIDER_SITE_OTHER): Payer: Medicare HMO | Admitting: Sports Medicine

## 2022-12-31 VITALS — BP 132/84 | HR 84 | Ht 66.0 in | Wt 182.0 lb

## 2022-12-31 DIAGNOSIS — M25551 Pain in right hip: Secondary | ICD-10-CM | POA: Diagnosis not present

## 2022-12-31 DIAGNOSIS — M7061 Trochanteric bursitis, right hip: Secondary | ICD-10-CM | POA: Diagnosis not present

## 2022-12-31 NOTE — Patient Instructions (Signed)
Glute HEP  4 week follow up

## 2023-01-01 ENCOUNTER — Telehealth: Payer: Self-pay | Admitting: *Deleted

## 2023-01-01 NOTE — Telephone Encounter (Signed)
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 She would like this release to mychart.

## 2023-01-01 NOTE — Telephone Encounter (Signed)
Note sent via mychart

## 2023-01-05 NOTE — Telephone Encounter (Signed)
Attempted to reach pt. Left a detail message for pt and will send message to mychart as well.

## 2023-01-06 IMAGING — MR MR ANKLE*R* W/O CM
5 series · 37 of 40 positions shown · non-contrast
Comparison: None Available.

CLINICAL DATA: Foot pain and swelling.

EXAM:
MRI OF THE RIGHT ANKLE WITHOUT CONTRAST
TECHNIQUE: Multiplanar, multisequence MR imaging of the ankle was performed. No
intravenous contrast was administered.

[Series 4: T2 fat-sat · axial · 3.0mm · 0.50mm/px · z∈[-72,+49]mm · 9 of 32 slices shown (1 of 2)]
[im 1/32]
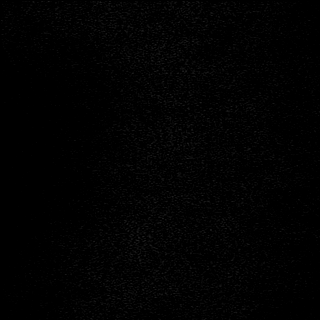
[im 4/32]
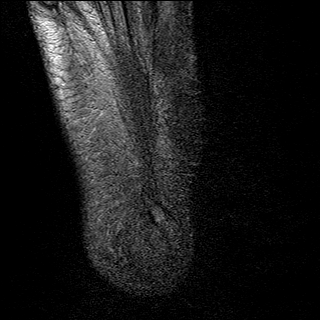
[im 8/32]
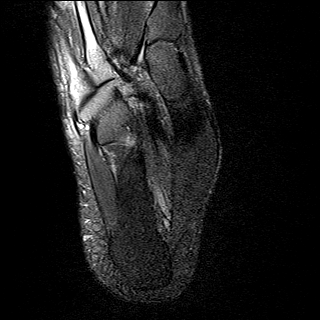
[im 12/32]
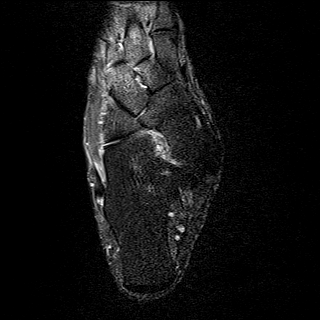
[im 16/32]
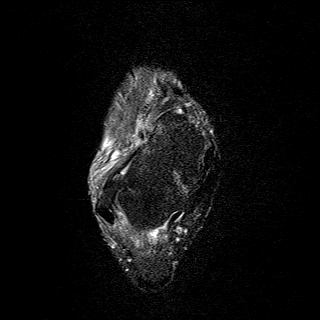
[im 20/32]
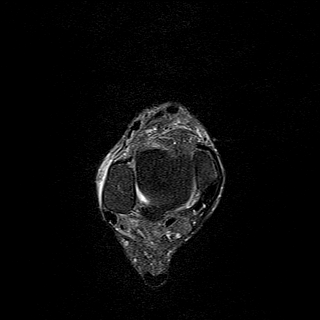
[im 24/32]
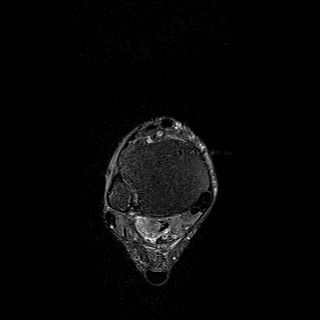
[im 28/32]
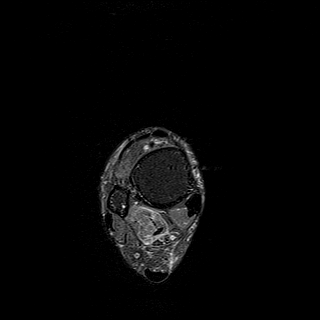
[im 32/32]
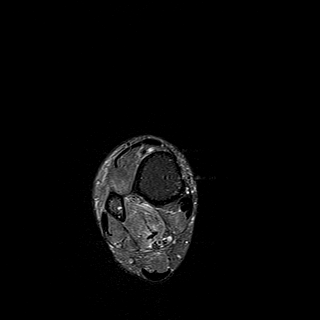

[Series 5: PD fat-sat · axial · 3.0mm · 0.50mm/px · z∈[-72,+49]mm · 9 of 32 slices shown]
[im 1/32]
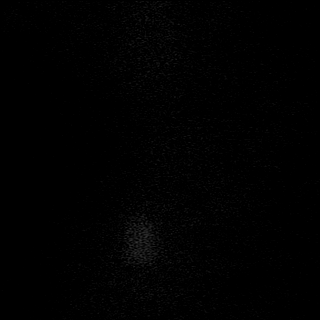
[im 4/32]
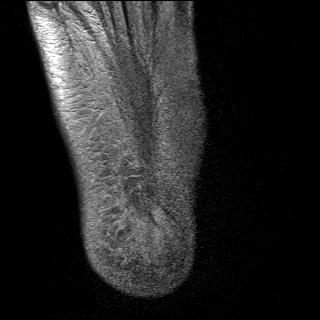
[im 8/32]
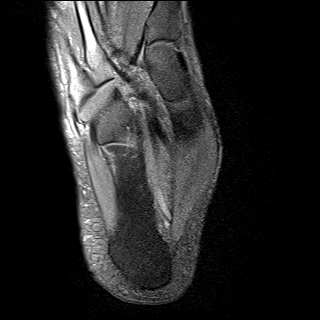
[im 12/32]
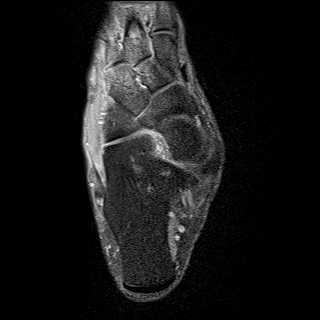
[im 16/32]
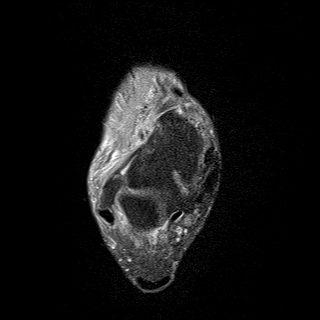
[im 20/32]
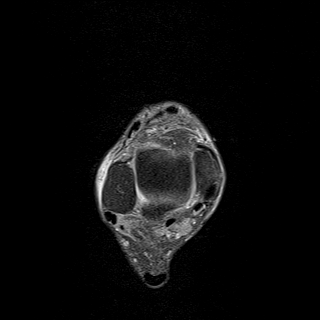
[im 24/32]
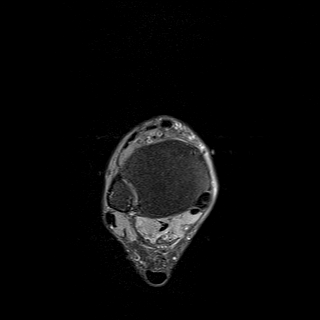
[im 28/32]
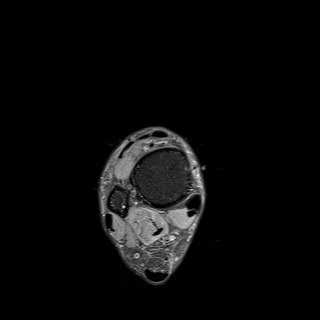
[im 32/32]
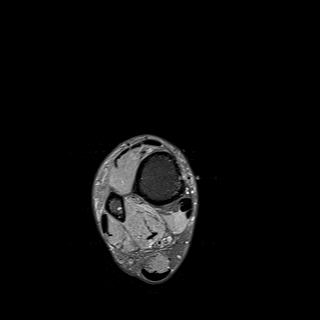

[Series 6: T1 · sagittal · 4.0mm · 0.56mm/px · 6 of 22 slices shown]
[im 1/22]
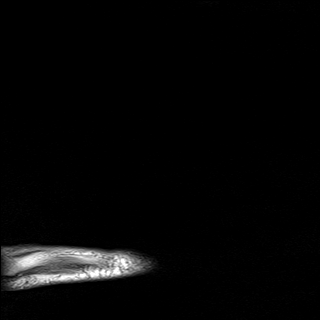
[im 5/22]
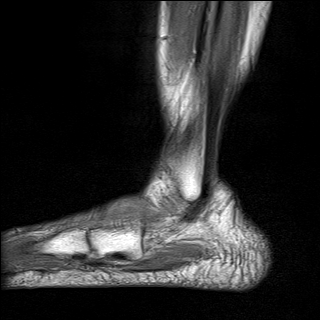
[im 9/22]
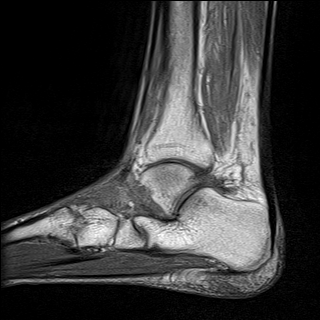
[im 13/22]
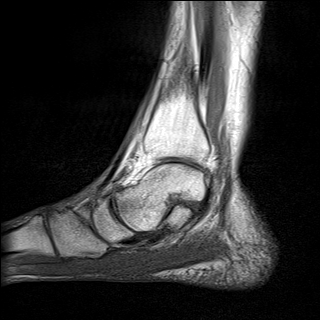
[im 17/22]
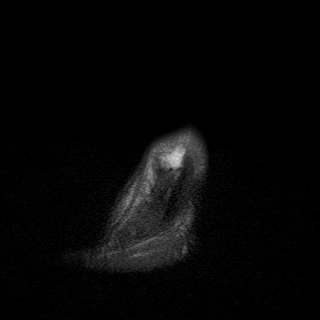
[im 22/22]
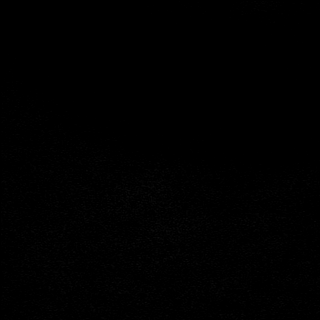

[Series 7: STIR · sagittal · 4.0mm · 0.35mm/px · 5 of 22 slices shown]
[im 1/22]
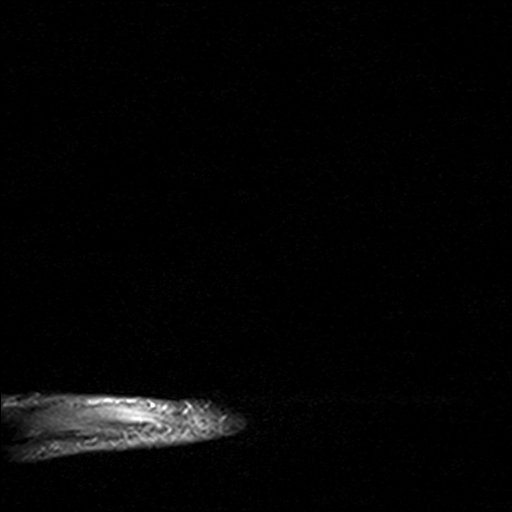
[im 5/22]
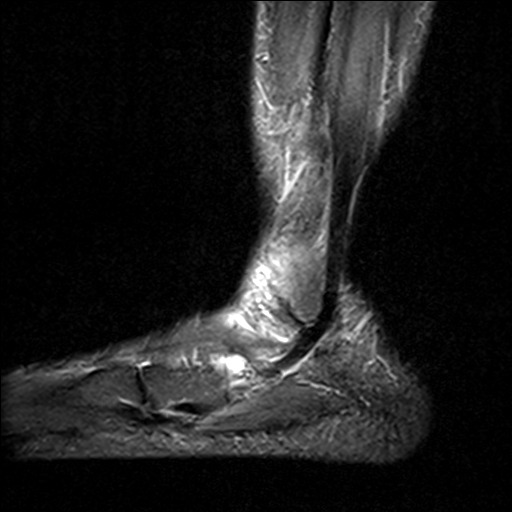
[im 9/22]
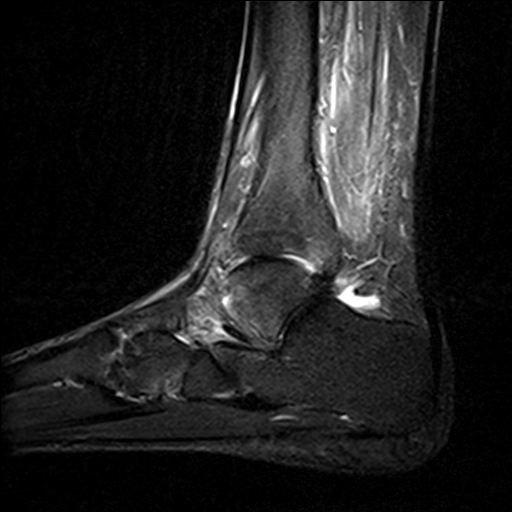
[im 13/22]
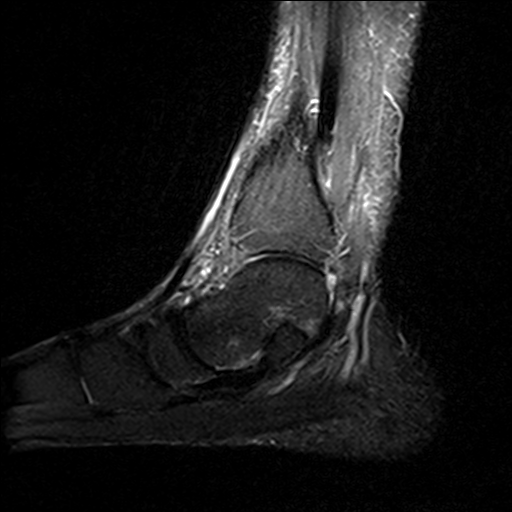
[im 17/22]
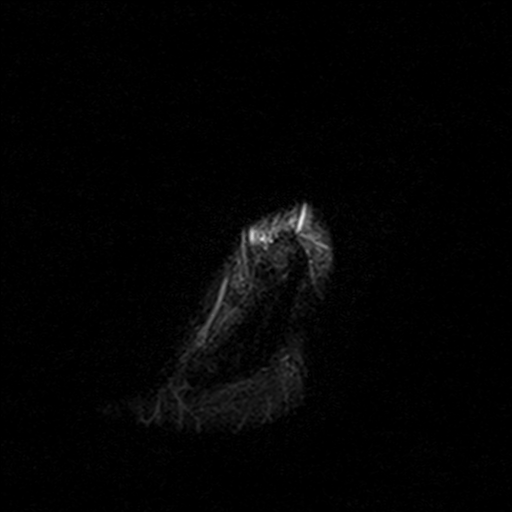

[Series 8: T2 fat-sat · coronal · 3.0mm · 0.50mm/px · 8 of 35 slices shown (2 of 2)]
[im 1/35]
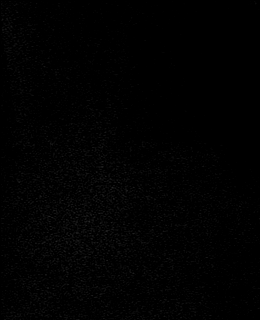
[im 4/35]
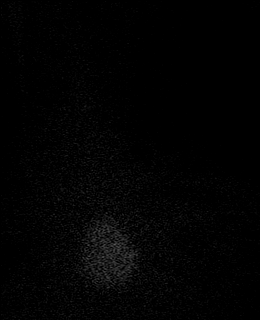
[im 12/35]
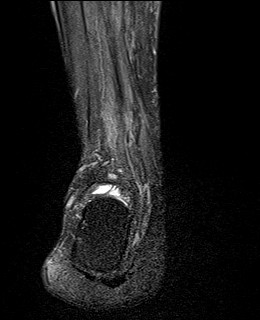
[im 16/35]
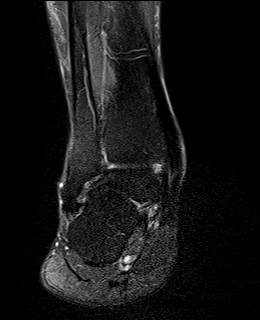
[im 19/35]
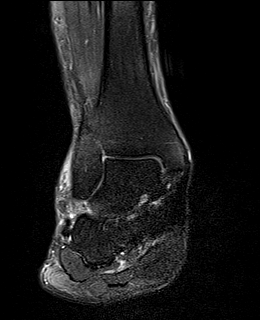
[im 23/35]
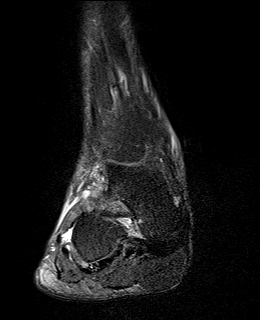
[im 31/35]
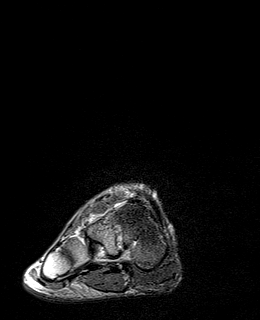
[im 35/35]
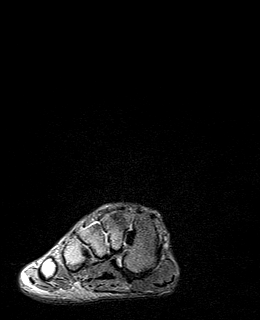

[37 of 40 positions shown; findings below may reference images not displayed]

FINDINGS: TENDONS

Peroneal: Peroneal longus tendon intact. Peroneal brevis intact.

Posteromedial: Mild tendinosis of the posterior tibial tendon with a
tiny interstitial tear. Flexor hallucis longus tendon intact. Flexor
digitorum longus tendon intact.

Anterior: Tibialis anterior tendon intact. Extensor hallucis longus
tendon intact Extensor digitorum longus tendon intact.

Achilles:  Intact.

Plantar Fascia: Intact.

LIGAMENTS

Lateral: Anterior talofibular ligament intact. Calcaneofibular
ligament intact. Posterior talofibular ligament intact. Anterior and
posterior tibiofibular ligaments intact.

Medial: Deltoid ligament intact. Spring ligament intact.

CARTILAGE

Ankle Joint: No joint effusion. Normal ankle mortise. No chondral
defect.

Subtalar Joints/Sinus Tarsi: Normal subtalar joints. No subtalar
joint effusion. Effacement of the normal sinus tarsi fat as can be
seen with sinus tarsi syndrome.

Bones: No aggressive osseous lesion. No fracture or dislocation.
Relative pes planus. Subcortical extra-articular bone marrow edema
involving the lateral talus as can be seen with posterolateral ankle
impingement.

Soft Tissue: No fluid collection or hematoma. Muscles are normal
without edema or atrophy. Tarsal tunnel is normal.
IMPRESSION: 1. Mild tendinosis of the posterior tibial tendon with a tiny
interstitial tear.
2. Subcortical extra-articular bone marrow edema involving the
lateral talus as can be seen with posterolateral ankle impingement.
3. Effacement of the normal sinus tarsi fat as can be seen with
sinus tarsi syndrome.

## 2023-01-15 NOTE — Telephone Encounter (Signed)
Attempted to call pt to follow up on mychart message. Looks like it has not been read. Left a detail message.

## 2023-01-25 ENCOUNTER — Ambulatory Visit: Payer: Medicare HMO | Admitting: Sports Medicine

## 2023-01-25 NOTE — Progress Notes (Deleted)
Sandra Bean [facility] [address] [place] [address] Phone: [phone]   Assessment and Plan:     There are no diagnoses linked to this encounter.  ***   Pertinent previous records reviewed include ***   Follow Up: ***     Subjective:   I, Sandra Bean, am serving as a Neurosurgeon for Doctor Richardean Sale   Chief Complaint: low back pain    HPI:    12/31/2022 Patient is a 62 year old female complaining of low back pain. Patient states 11/23/2022 her right knee buckled and she fell out of bed. Has been using tylenol and that dint help . Icy hot does not help. She has pain when walking her dog. She isnt able to do her ADLs with out pain. Pain when sitting down on the toilet . She is not able to sleep through the night. Pain radiates from the hip to the low back. Prednisone doesn't help much . Also has flexeril . TTP    01/25/2023 Patient states    Relevant Historical Information: Hypertension    Additional pertinent review of systems negative.   Current Outpatient Medications:    Accu-Chek FastClix Lancets MISC, Check blood sugar once daily., Disp: , Rfl:    amLODipine (NORVASC) 10 MG tablet, Take 1 tablet (10 mg total) by mouth every morning., Disp: 90 tablet, Rfl: 1   ARIPiprazole (ABILIFY) 5 MG tablet, Take 1 tablet (5 mg total) by mouth daily., Disp: 30 tablet, Rfl: 1   atorvastatin (LIPITOR) 20 MG tablet, Take 1 tablet (20 mg total) by mouth daily., Disp: 90 tablet, Rfl: 3   cyclobenzaprine (FLEXERIL) 10 MG tablet, Take 1 tablet (10 mg total) by mouth 3 (three) times daily as needed for muscle spasms., Disp: 30 tablet, Rfl: 0   desvenlafaxine (PRISTIQ) 100 MG 24 hr tablet, Take 1 tablet (100 mg total) by mouth daily., Disp: 90 tablet, Rfl: 0   diclofenac (VOLTAREN) 75 MG EC tablet, Take 1 tablet (75 mg total) by mouth 2 (two) times daily., Disp: 180 tablet, Rfl: 1   DULoxetine (CYMBALTA) 30 MG capsule, Take 1 capsule (30  mg total) by mouth daily., Disp: 90 capsule, Rfl: 0   ferrous gluconate (FERGON) 324 MG tablet, , Disp: , Rfl:    glucose blood (ACCU-CHEK GUIDE) test strip, Check blood sugar once daily., Disp: , Rfl:    losartan-hydrochlorothiazide (HYZAAR) 100-25 MG tablet, Take 1 tablet by mouth daily., Disp: 90 tablet, Rfl: 3   metoprolol succinate (TOPROL-XL) 50 MG 24 hr tablet, Take 1 tablet (50 mg total) by mouth daily., Disp: 90 tablet, Rfl: 3   Multiple Vitamin (MULTIVITAMIN IRON-FREE) TABS, Take 1 tablet by mouth daily., Disp: , Rfl:    Multiple Vitamins-Minerals (HAIR SKIN AND NAILS FORMULA) TABS, Take 1 tablet by mouth daily. , Disp: , Rfl:    Omega-3 Fatty Acids (FISH OIL) 1000 MG CAPS, Take by mouth., Disp: , Rfl:    omeprazole (PRILOSEC) 20 MG capsule, Take 1 capsule (20 mg total) by mouth daily., Disp: 90 capsule, Rfl: 1   traZODone (DESYREL) 100 MG tablet, TAKE 2 TABLETS BY MOUTH AT  BEDTIME AS NEEDED FOR SLEEP, Disp: 60 tablet, Rfl: 11   vitamin C (ASCORBIC ACID) 500 MG tablet, Take 500 mg by mouth daily., Disp: , Rfl:    vitamin E 1000 UNIT capsule, Take by mouth., Disp: , Rfl:    Objective:     There were no vitals filed for  this visit.    There is no height or weight on file to calculate BMI.    Physical Exam:    ***   Electronically signed by:  Sandra Bean [facility] 12:50 PM 01/25/23

## 2023-01-26 NOTE — Progress Notes (Unsigned)
Sandra Bean D.[facility] [address] [place] [address] Phone: [phone]   Assessment and Plan:     There are no diagnoses linked to this encounter.  ***   Pertinent previous records reviewed include ***   Follow Up: ***     Subjective:   I, Sandra Bean, am serving as a Neurosurgeon for Doctor Richardean Sale   Chief Complaint: low back pain    HPI:    12/31/2022 Patient is a 62 year old female complaining of low back pain. Patient states 11/23/2022 her right knee buckled and she fell out of bed. Has been using tylenol and that dint help . Icy hot does not help. She has pain when walking her dog. She isnt able to do her ADLs with out pain. Pain when sitting down on the toilet . She is not able to sleep through the night. Pain radiates from the hip to the low back. Prednisone doesn't help much . Also has flexeril . TTP    01/27/2023 Patient states    Relevant Historical Information: Hypertension    Additional pertinent review of systems negative.   Current Outpatient Medications:    Accu-Chek FastClix Lancets MISC, Check blood sugar once daily., Disp: , Rfl:    amLODipine (NORVASC) 10 MG tablet, Take 1 tablet (10 mg total) by mouth every morning., Disp: 90 tablet, Rfl: 1   ARIPiprazole (ABILIFY) 5 MG tablet, Take 1 tablet (5 mg total) by mouth daily., Disp: 30 tablet, Rfl: 1   atorvastatin (LIPITOR) 20 MG tablet, Take 1 tablet (20 mg total) by mouth daily., Disp: 90 tablet, Rfl: 3   cyclobenzaprine (FLEXERIL) 10 MG tablet, Take 1 tablet (10 mg total) by mouth 3 (three) times daily as needed for muscle spasms., Disp: 30 tablet, Rfl: 0   desvenlafaxine (PRISTIQ) 100 MG 24 hr tablet, Take 1 tablet (100 mg total) by mouth daily., Disp: 90 tablet, Rfl: 0   diclofenac (VOLTAREN) 75 MG EC tablet, Take 1 tablet (75 mg total) by mouth 2 (two) times daily., Disp: 180 tablet, Rfl: 1   DULoxetine (CYMBALTA) 30 MG capsule, Take 1 capsule (30  mg total) by mouth daily., Disp: 90 capsule, Rfl: 0   ferrous gluconate (FERGON) 324 MG tablet, , Disp: , Rfl:    glucose blood (ACCU-CHEK GUIDE) test strip, Check blood sugar once daily., Disp: , Rfl:    losartan-hydrochlorothiazide (HYZAAR) 100-25 MG tablet, Take 1 tablet by mouth daily., Disp: 90 tablet, Rfl: 3   metoprolol succinate (TOPROL-XL) 50 MG 24 hr tablet, Take 1 tablet (50 mg total) by mouth daily., Disp: 90 tablet, Rfl: 3   Multiple Vitamin (MULTIVITAMIN IRON-FREE) TABS, Take 1 tablet by mouth daily., Disp: , Rfl:    Multiple Vitamins-Minerals (HAIR SKIN AND NAILS FORMULA) TABS, Take 1 tablet by mouth daily. , Disp: , Rfl:    Omega-3 Fatty Acids (FISH OIL) 1000 MG CAPS, Take by mouth., Disp: , Rfl:    omeprazole (PRILOSEC) 20 MG capsule, Take 1 capsule (20 mg total) by mouth daily., Disp: 90 capsule, Rfl: 1   traZODone (DESYREL) 100 MG tablet, TAKE 2 TABLETS BY MOUTH AT  BEDTIME AS NEEDED FOR SLEEP, Disp: 60 tablet, Rfl: 11   vitamin C (ASCORBIC ACID) 500 MG tablet, Take 500 mg by mouth daily., Disp: , Rfl:    vitamin E 1000 UNIT capsule, Take by mouth., Disp: , Rfl:    Objective:     There were no vitals filed for  this visit.    There is no height or weight on file to calculate BMI.    Physical Exam:    ***   Electronically signed by:  Sandra Bean D.[facility] 3:54 PM 01/26/23

## 2023-01-27 ENCOUNTER — Ambulatory Visit: Payer: Medicare HMO

## 2023-01-27 ENCOUNTER — Ambulatory Visit: Payer: Medicare HMO | Admitting: Sports Medicine

## 2023-01-27 VITALS — HR 95 | Ht 66.0 in | Wt 189.0 lb

## 2023-01-27 DIAGNOSIS — M47816 Spondylosis without myelopathy or radiculopathy, lumbar region: Secondary | ICD-10-CM | POA: Diagnosis not present

## 2023-01-27 DIAGNOSIS — M545 Low back pain, unspecified: Secondary | ICD-10-CM | POA: Diagnosis not present

## 2023-01-27 DIAGNOSIS — M25552 Pain in left hip: Secondary | ICD-10-CM

## 2023-01-27 DIAGNOSIS — M5136 Other intervertebral disc degeneration, lumbar region with discogenic back pain only: Secondary | ICD-10-CM | POA: Diagnosis not present

## 2023-01-27 DIAGNOSIS — M48061 Spinal stenosis, lumbar region without neurogenic claudication: Secondary | ICD-10-CM | POA: Diagnosis not present

## 2023-01-27 DIAGNOSIS — M5126 Other intervertebral disc displacement, lumbar region: Secondary | ICD-10-CM | POA: Diagnosis not present

## 2023-01-27 DIAGNOSIS — G8929 Other chronic pain: Secondary | ICD-10-CM

## 2023-01-27 DIAGNOSIS — M25551 Pain in right hip: Secondary | ICD-10-CM

## 2023-01-27 MED ORDER — METHYLPREDNISOLONE ACETATE 80 MG/ML IJ SUSP
80.0000 mg | Freq: Once | INTRAMUSCULAR | Status: AC
  Administered 2023-01-27: 80 mg via INTRAMUSCULAR

## 2023-01-27 MED ORDER — MELOXICAM 15 MG PO TABS: 15.0000 mg | ORAL_TABLET | Freq: Every day | ORAL | 0 refills | Status: DC

## 2023-01-27 MED ORDER — KETOROLAC TROMETHAMINE 60 MG/2ML IM SOLN
60.0000 mg | Freq: Once | INTRAMUSCULAR | Status: AC
  Administered 2023-01-27: 60 mg via INTRAMUSCULAR

## 2023-01-27 NOTE — Patient Instructions (Signed)
-   Start meloxicam 15 mg daily x2 weeks. May use remaining meloxicam as needed once daily for pain control.  Do not to use additional NSAIDs while taking meloxicam.  May use Tylenol [phone] mg 2 to 3 times a day for breakthrough pain. Lumbar MRI  Follow up 5 days after MRI to discuss results

## 2023-01-27 NOTE — Addendum Note (Signed)
Addended by: Evon Slack on: 01/27/2023 03:34 PM   Modules accepted: Orders

## 2023-01-28 ENCOUNTER — Ambulatory Visit: Payer: Medicare HMO | Admitting: Sports Medicine

## 2023-01-31 ENCOUNTER — Ambulatory Visit: Payer: Medicare HMO

## 2023-01-31 DIAGNOSIS — M47816 Spondylosis without myelopathy or radiculopathy, lumbar region: Secondary | ICD-10-CM | POA: Diagnosis not present

## 2023-01-31 DIAGNOSIS — M5186 Other intervertebral disc disorders, lumbar region: Secondary | ICD-10-CM

## 2023-01-31 DIAGNOSIS — G8929 Other chronic pain: Secondary | ICD-10-CM

## 2023-01-31 DIAGNOSIS — M5125 Other intervertebral disc displacement, thoracolumbar region: Secondary | ICD-10-CM | POA: Diagnosis not present

## 2023-01-31 DIAGNOSIS — M48061 Spinal stenosis, lumbar region without neurogenic claudication: Secondary | ICD-10-CM

## 2023-01-31 DIAGNOSIS — M25551 Pain in right hip: Secondary | ICD-10-CM

## 2023-01-31 DIAGNOSIS — M25552 Pain in left hip: Secondary | ICD-10-CM

## 2023-01-31 DIAGNOSIS — M4807 Spinal stenosis, lumbosacral region: Secondary | ICD-10-CM | POA: Diagnosis not present

## 2023-02-03 ENCOUNTER — Encounter (HOSPITAL_COMMUNITY): Payer: Self-pay | Admitting: Psychiatry

## 2023-02-03 ENCOUNTER — Telehealth (INDEPENDENT_AMBULATORY_CARE_PROVIDER_SITE_OTHER): Payer: Medicare HMO | Admitting: Psychiatry

## 2023-02-03 DIAGNOSIS — F5102 Adjustment insomnia: Secondary | ICD-10-CM | POA: Diagnosis not present

## 2023-02-03 DIAGNOSIS — F331 Major depressive disorder, recurrent, moderate: Secondary | ICD-10-CM

## 2023-02-03 DIAGNOSIS — F332 Major depressive disorder, recurrent severe without psychotic features: Secondary | ICD-10-CM | POA: Diagnosis not present

## 2023-02-03 DIAGNOSIS — F411 Generalized anxiety disorder: Secondary | ICD-10-CM

## 2023-02-03 DIAGNOSIS — F4310 Post-traumatic stress disorder, unspecified: Secondary | ICD-10-CM | POA: Diagnosis not present
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
## 2023-02-09 ENCOUNTER — Ambulatory Visit (INDEPENDENT_AMBULATORY_CARE_PROVIDER_SITE_OTHER): Payer: Medicare HMO | Admitting: Sports Medicine

## 2023-02-09 ENCOUNTER — Ambulatory Visit: Payer: Medicare HMO | Admitting: Sports Medicine

## 2023-02-09 VITALS — HR 97 | Ht 66.0 in | Wt 189.0 lb

## 2023-02-09 DIAGNOSIS — M5442 Lumbago with sciatica, left side: Secondary | ICD-10-CM

## 2023-02-09 DIAGNOSIS — M5441 Lumbago with sciatica, right side: Secondary | ICD-10-CM

## 2023-02-09 DIAGNOSIS — G8929 Other chronic pain: Secondary | ICD-10-CM

## 2023-02-09 DIAGNOSIS — M48062 Spinal stenosis, lumbar region with neurogenic claudication: Secondary | ICD-10-CM | POA: Diagnosis not present

## 2023-02-09 NOTE — Patient Instructions (Addendum)
Epidural right L5-S1 Epidural left L4-5 Tylenol [phone] mg 2-3 times a day for pain relief   Follow up 2 weeks after to discuss results

## 2023-02-09 NOTE — Progress Notes (Signed)
Sandra Bean D.[facility] [address] [place] [address] Phone: [phone]   Assessment and Plan:     1. Chronic bilateral low back pain with bilateral sciatica 2. Spinal stenosis of lumbar region with neurogenic claudication -Chronic with exacerbation, subsequent visit - Reviewed patient's MRI which showed stenosis most pronounced at L4-L5 and neuroforaminal stenosis at L5-S1.  Patient symptoms and MRI are most consistent with right-sided stenosis at L5-S1 and left-sided stenosis at L4-L5 causing symptoms.  Patient elected for epidural CSI at these levels.  Order placed today - Start Tylenol 500 to 1000 mg tablets 2-3 times a day for day-to-day pain relief - Discontinue meloxicam 15 mg daily.  Patient did not feel any significant difference in symptoms while taking medication - Patient did not feel any significant difference after IM injection of Toradol/methylprednisone, meloxicam course, greater trochanteric CSI   Pertinent previous records reviewed include lumbar MRI 01/31/2023  Follow Up: 2 weeks after epidural CSI to review benefit.  Could consider repeat epidural   Subjective:   I, Sandra Bean, am serving as a Neurosurgeon for Doctor Sandra Bean   Chief Complaint: low back pain    HPI:    12/31/2022 Patient is a 62 year old female complaining of low back pain. Patient states 11/23/2022 her right knee buckled and she fell out of bed. Has been using tylenol and that dint help . Icy hot does not help. She has pain when walking her dog. She isnt able to do her ADLs with out pain. Pain when sitting down on the toilet . She is not able to sleep through the night. Pain radiates from the hip to the low back. Prednisone doesn't help much . Also has flexeril . TTP     01/27/2023 Patient states back pain is horrendous. Hip pain is now bilateral depending on where the back pain is. Tylenol is only helping a little .   02/09/2023 Patient  states that she is in pain     Relevant Historical Information: Hypertension  Additional pertinent review of systems negative.   Current Outpatient Medications:    Accu-Chek FastClix Lancets MISC, Check blood sugar once daily., Disp: , Rfl:    amLODipine (NORVASC) 10 MG tablet, Take 1 tablet (10 mg total) by mouth every morning., Disp: 90 tablet, Rfl: 1   ARIPiprazole (ABILIFY) 5 MG tablet, Take 1 tablet (5 mg total) by mouth daily., Disp: 30 tablet, Rfl: 1   atorvastatin (LIPITOR) 20 MG tablet, Take 1 tablet (20 mg total) by mouth daily., Disp: 90 tablet, Rfl: 3   cyclobenzaprine (FLEXERIL) 10 MG tablet, Take 1 tablet (10 mg total) by mouth 3 (three) times daily as needed for muscle spasms., Disp: 30 tablet, Rfl: 0   desvenlafaxine (PRISTIQ) 100 MG 24 hr tablet, Take 1 tablet (100 mg total) by mouth daily., Disp: 90 tablet, Rfl: 0   diclofenac (VOLTAREN) 75 MG EC tablet, Take 1 tablet (75 mg total) by mouth 2 (two) times daily., Disp: 180 tablet, Rfl: 1   DULoxetine (CYMBALTA) 30 MG capsule, Take 1 capsule (30 mg total) by mouth daily., Disp: 90 capsule, Rfl: 0   ferrous gluconate (FERGON) 324 MG tablet, , Disp: , Rfl:    glucose blood (ACCU-CHEK GUIDE) test strip, Check blood sugar once daily., Disp: , Rfl:    losartan-hydrochlorothiazide (HYZAAR) 100-25 MG tablet, Take 1 tablet by mouth daily., Disp: 90 tablet, Rfl: 3   meloxicam (MOBIC) 15 MG tablet, Take 1 tablet (15  mg total) by mouth daily., Disp: 14 tablet, Rfl: 0   metoprolol succinate (TOPROL-XL) 50 MG 24 hr tablet, Take 1 tablet (50 mg total) by mouth daily., Disp: 90 tablet, Rfl: 3   Multiple Vitamin (MULTIVITAMIN IRON-FREE) TABS, Take 1 tablet by mouth daily., Disp: , Rfl:    Multiple Vitamins-Minerals (HAIR SKIN AND NAILS FORMULA) TABS, Take 1 tablet by mouth daily. , Disp: , Rfl:    Omega-3 Fatty Acids (FISH OIL) 1000 MG CAPS, Take by mouth., Disp: , Rfl:    omeprazole (PRILOSEC) 20 MG capsule, Take 1 capsule (20 mg total) by  mouth daily., Disp: 90 capsule, Rfl: 1   traZODone (DESYREL) 100 MG tablet, TAKE 2 TABLETS BY MOUTH AT  BEDTIME AS NEEDED FOR SLEEP, Disp: 60 tablet, Rfl: 11   vitamin C (ASCORBIC ACID) 500 MG tablet, Take 500 mg by mouth daily., Disp: , Rfl:    vitamin E 1000 UNIT capsule, Take by mouth., Disp: , Rfl:    Objective:     Vitals:   02/09/23 1419  Pulse: 97  SpO2: 97%  Weight: 189 lb (85.7 kg)  Height: 5\' 6"  (1.676 m)      Body mass index is 30.51 kg/m.    Physical Exam:    Gen: Appears well, nad, nontoxic and pleasant Psych: Alert and oriented, appropriate mood and affect Neuro: sensation intact, strength is 5/5 in upper and lower extremities, muscle tone wnl Skin: no susupicious lesions or rashes  Back - Normal skin, Spine with normal alignment and no deformity.   No tenderness to vertebral process palpation.   Lumbar paraspinous muscles are   tender and without spasm NTTP gluteal musculature Straight leg raise positive bilaterally.  Right leg reproduced radicular symptoms along glutes and posterior thigh.  Left leg reproduced radicular symptoms along lateral hip and lateral thigh Trendelenberg negative Piriformis Test negative, do reproduce stretching sensation Gait normal  Pain with lumbar extension  Electronically signed by:  Sandra Bean D.[facility] 2:41 PM 02/09/23

## 2023-02-12 ENCOUNTER — Ambulatory Visit: Payer: Medicare HMO | Admitting: Sports Medicine

## 2023-02-15 ENCOUNTER — Ambulatory Visit: Payer: Medicare HMO | Admitting: Family Medicine

## 2023-02-17 ENCOUNTER — Encounter: Payer: Self-pay | Admitting: Sports Medicine

## 2023-02-17 NOTE — Discharge Instructions (Signed)

## 2023-02-22 ENCOUNTER — Inpatient Hospital Stay
Admission: RE | Admit: 2023-02-22 | Discharge: 2023-02-22 | Disposition: A | Discharge status: Home / Self Care | Payer: Medicare HMO | Source: Ambulatory Visit | Attending: Sports Medicine | Admitting: Sports Medicine

## 2023-02-22 ENCOUNTER — Other Ambulatory Visit: Payer: Medicare HMO

## 2023-02-23 ENCOUNTER — Telehealth (HOSPITAL_COMMUNITY): Payer: Self-pay | Admitting: *Deleted

## 2023-02-23 DIAGNOSIS — F332 Major depressive disorder, recurrent severe without psychotic features: Secondary | ICD-10-CM

## 2023-02-23 MED ORDER — ARIPIPRAZOLE 5 MG PO TABS: 5.0000 mg | ORAL_TABLET | Freq: Every day | ORAL | 1 refills | Status: DC

## 2023-02-23 NOTE — Telephone Encounter (Signed)
[facility] REFILL REQUEST  ARIPiprazole (ABILIFY) 5 MG tablet   Take 1 tablet (5 mg total) by mouth daily. - Oral   Dispense: 30 tablet   NEXT APPT  05/05/23 LAST APPT   02/03/23

## 2023-02-23 NOTE — Addendum Note (Signed)
Addended by: Thresa Ross on: 02/23/2023 10:14 AM   Modules accepted: Orders

## 2023-02-24 NOTE — Discharge Instructions (Signed)

## 2023-02-25 ENCOUNTER — Ambulatory Visit
Admission: RE | Admit: 2023-02-25 | Discharge: 2023-02-25 | Disposition: A | Discharge status: Home / Self Care | Payer: Medicare HMO | Source: Ambulatory Visit | Attending: Sports Medicine | Admitting: Sports Medicine

## 2023-02-25 ENCOUNTER — Other Ambulatory Visit: Payer: Medicare HMO

## 2023-02-25 DIAGNOSIS — M48062 Spinal stenosis, lumbar region with neurogenic claudication: Secondary | ICD-10-CM

## 2023-02-25 DIAGNOSIS — M48061 Spinal stenosis, lumbar region without neurogenic claudication: Secondary | ICD-10-CM | POA: Diagnosis not present

## 2023-02-25 DIAGNOSIS — M4807 Spinal stenosis, lumbosacral region: Secondary | ICD-10-CM | POA: Diagnosis not present

## 2023-02-25 DIAGNOSIS — G8929 Other chronic pain: Secondary | ICD-10-CM

## 2023-02-25 DIAGNOSIS — M4727 Other spondylosis with radiculopathy, lumbosacral region: Secondary | ICD-10-CM | POA: Diagnosis not present

## 2023-02-25 MED ORDER — METHYLPREDNISOLONE ACETATE 40 MG/ML INJ SUSP (RADIOLOG
80.0000 mg | Freq: Once | INTRAMUSCULAR | Status: AC
  Administered 2023-02-25: 80 mg via EPIDURAL

## 2023-02-25 MED ORDER — IOPAMIDOL (ISOVUE-M 200) INJECTION 41%
1.0000 mL | Freq: Once | INTRAMUSCULAR | Status: AC
  Administered 2023-02-25: 1 mL via EPIDURAL

## 2023-03-10 NOTE — Progress Notes (Unsigned)
Sandra Bean D.[name] [address] [place] [address] Phone: [phone]   Assessment and Plan:     There are no diagnoses linked to this encounter.  ***   Pertinent previous records reviewed include ***    Follow Up: ***     Subjective:   I, Sandra Bean, am serving as a Neurosurgeon for Doctor Richardean Sale   Chief Complaint: low back pain    HPI:    12/31/2022 Patient is a 62 year old female complaining of low back pain. Patient states 11/23/2022 her right knee buckled and she fell out of bed. Has been using tylenol and that dint help . Icy hot does not help. She has pain when walking her dog. She isnt able to do her ADLs with out pain. Pain when sitting down on the toilet . She is not able to sleep through the night. Pain radiates from the hip to the low back. Prednisone doesn't help much . Also has flexeril . TTP     01/27/2023 Patient states back pain is horrendous. Hip pain is now bilateral depending on where the back pain is. Tylenol is only helping a little .    02/09/2023 Patient states that she is in pain   03/11/2023 Patient states   Relevant Historical Information: Hypertension  Additional pertinent review of systems negative.   Current Outpatient Medications:    Accu-Chek FastClix Lancets MISC, Check blood sugar once daily., Disp: , Rfl:    amLODipine (NORVASC) 10 MG tablet, Take 1 tablet (10 mg total) by mouth every morning., Disp: 90 tablet, Rfl: 1   ARIPiprazole (ABILIFY) 5 MG tablet, Take 1 tablet (5 mg total) by mouth daily., Disp: 30 tablet, Rfl: 1   atorvastatin (LIPITOR) 20 MG tablet, Take 1 tablet (20 mg total) by mouth daily., Disp: 90 tablet, Rfl: 3   cyclobenzaprine (FLEXERIL) 10 MG tablet, Take 1 tablet (10 mg total) by mouth 3 (three) times daily as needed for muscle spasms., Disp: 30 tablet, Rfl: 0   desvenlafaxine (PRISTIQ) 100 MG 24 hr tablet, Take 1 tablet (100 mg total) by mouth daily., Disp:  90 tablet, Rfl: 0   diclofenac (VOLTAREN) 75 MG EC tablet, Take 1 tablet (75 mg total) by mouth 2 (two) times daily., Disp: 180 tablet, Rfl: 1   DULoxetine (CYMBALTA) 30 MG capsule, Take 1 capsule (30 mg total) by mouth daily., Disp: 90 capsule, Rfl: 0   ferrous gluconate (FERGON) 324 MG tablet, , Disp: , Rfl:    glucose blood (ACCU-CHEK GUIDE) test strip, Check blood sugar once daily., Disp: , Rfl:    losartan-hydrochlorothiazide (HYZAAR) 100-25 MG tablet, Take 1 tablet by mouth daily., Disp: 90 tablet, Rfl: 3   meloxicam (MOBIC) 15 MG tablet, Take 1 tablet (15 mg total) by mouth daily., Disp: 14 tablet, Rfl: 0   metoprolol succinate (TOPROL-XL) 50 MG 24 hr tablet, Take 1 tablet (50 mg total) by mouth daily., Disp: 90 tablet, Rfl: 3   Multiple Vitamin (MULTIVITAMIN IRON-FREE) TABS, Take 1 tablet by mouth daily., Disp: , Rfl:    Multiple Vitamins-Minerals (HAIR SKIN AND NAILS FORMULA) TABS, Take 1 tablet by mouth daily. , Disp: , Rfl:    Omega-3 Fatty Acids (FISH OIL) 1000 MG CAPS, Take by mouth., Disp: , Rfl:    omeprazole (PRILOSEC) 20 MG capsule, Take 1 capsule (20 mg total) by mouth daily., Disp: 90 capsule, Rfl: 1   traZODone (DESYREL) 100 MG tablet, TAKE 2 TABLETS BY  MOUTH AT  BEDTIME AS NEEDED FOR SLEEP, Disp: 60 tablet, Rfl: 11   vitamin C (ASCORBIC ACID) 500 MG tablet, Take 500 mg by mouth daily., Disp: , Rfl:    vitamin E 1000 UNIT capsule, Take by mouth., Disp: , Rfl:    Objective:     There were no vitals filed for this visit.    There is no height or weight on file to calculate BMI.    Physical Exam:    ***   Electronically signed by:  Sandra Bean D.[name] 7:42 AM 03/10/23

## 2023-03-11 ENCOUNTER — Ambulatory Visit: Payer: Medicare HMO | Admitting: Sports Medicine

## 2023-03-11 VITALS — HR 79 | Ht 66.0 in | Wt 180.0 lb

## 2023-03-11 DIAGNOSIS — G8929 Other chronic pain: Secondary | ICD-10-CM | POA: Diagnosis not present

## 2023-03-11 DIAGNOSIS — M48062 Spinal stenosis, lumbar region with neurogenic claudication: Secondary | ICD-10-CM | POA: Diagnosis not present

## 2023-03-11 DIAGNOSIS — M5442 Lumbago with sciatica, left side: Secondary | ICD-10-CM

## 2023-03-11 DIAGNOSIS — M5441 Lumbago with sciatica, right side: Secondary | ICD-10-CM | POA: Diagnosis not present

## 2023-03-11 NOTE — Patient Instructions (Addendum)
Right L4-5 epidural CSI ordered today

## 2023-03-19 ENCOUNTER — Encounter: Payer: Self-pay | Admitting: Sports Medicine

## 2023-03-25 NOTE — Discharge Instructions (Signed)

## 2023-03-26 ENCOUNTER — Ambulatory Visit
Admission: RE | Admit: 2023-03-26 | Discharge: 2023-03-26 | Disposition: A | Discharge status: Home / Self Care | Payer: Medicare HMO | Source: Ambulatory Visit | Attending: Sports Medicine

## 2023-03-26 DIAGNOSIS — M4727 Other spondylosis with radiculopathy, lumbosacral region: Secondary | ICD-10-CM | POA: Diagnosis not present

## 2023-03-26 DIAGNOSIS — G8929 Other chronic pain: Secondary | ICD-10-CM

## 2023-03-26 MED ORDER — IOPAMIDOL (ISOVUE-M 200) INJECTION 41%
1.0000 mL | Freq: Once | INTRAMUSCULAR | Status: AC
  Administered 2023-03-26: 1 mL via EPIDURAL

## 2023-03-26 MED ORDER — METHYLPREDNISOLONE ACETATE 40 MG/ML INJ SUSP (RADIOLOG
80.0000 mg | Freq: Once | INTRAMUSCULAR | Status: AC
  Administered 2023-03-26: 80 mg via EPIDURAL

## 2023-04-13 ENCOUNTER — Ambulatory Visit (INDEPENDENT_AMBULATORY_CARE_PROVIDER_SITE_OTHER): Payer: Medicare HMO | Admitting: Family Medicine

## 2023-04-13 ENCOUNTER — Encounter: Payer: Self-pay | Admitting: Family Medicine

## 2023-04-13 DIAGNOSIS — Z7984 Long term (current) use of oral hypoglycemic drugs: Secondary | ICD-10-CM

## 2023-04-13 DIAGNOSIS — E119 Type 2 diabetes mellitus without complications: Secondary | ICD-10-CM

## 2023-04-13 DIAGNOSIS — I10 Essential (primary) hypertension: Secondary | ICD-10-CM | POA: Diagnosis not present

## 2023-04-13 LAB — POCT GLYCOSYLATED HEMOGLOBIN (HGB A1C): Hemoglobin A1C: 7.5 % — AB (ref 4.0–5.6)

## 2023-04-13 MED ORDER — METOPROLOL SUCCINATE ER 100 MG PO TB24: 100.0000 mg | ORAL_TABLET | Freq: Every day | ORAL | 1 refills | Status: DC

## 2023-04-13 MED ORDER — METFORMIN HCL 500 MG PO TABS: 500.0000 mg | ORAL_TABLET | Freq: Two times a day (BID) | ORAL | 1 refills | Status: DC

## 2023-04-13 MED ORDER — LOSARTAN POTASSIUM-HCTZ 100-25 MG PO TABS: 1.0000 | ORAL_TABLET | Freq: Every day | ORAL | 3 refills | Status: DC

## 2023-04-13 NOTE — Assessment & Plan Note (Signed)
A1C is in the diabetic range today, pt reports she has taken metformin in the past, did not have side effects to it previously, will restart this medication for her and see her back in 3 months for repeat A1C.

## 2023-04-13 NOTE — Assessment & Plan Note (Signed)
Chronic, uncontrolled, pt is not in pain and is on her medications, likely it is elevated due to smoking before her visit. She reports she is getting high readings at home, will increase her metoprolol to 100 mg daily. I counseled her to watch out for low heart rate with the increase in dosage, she has a fit bit that monitors her HR. RTC in 3 months

## 2023-04-13 NOTE — Progress Notes (Signed)
Established Patient Office Visit  Subjective   Patient ID: Sandra Bean, female    DOB: 01-Jun-1960  Age: 63 y.o. MRN: [id number]  Chief Complaint  Patient presents with   Medical Management of Chronic Issues   Medication Refill    Pt is here for follow up today on her HTN and prediabetes. She reports that the abilify caused increased appetite and she was eating a lot of sweets -- also had trouble controlling her appetite and didn't follow her diet plan. States that the medication she is taking for her depression symptoms cause cravings and she has trouble with the cravings. A1C is up to 7.5 today.   HTN -- BP is elevated today, pt states she is only smoking about 12 cigarettes per day now, reports she did just smoke before her visit. She denies any chest pain, no headache or SOB. Reports compliance with her medication. Is due for CMP and microalbumin today.     Current Outpatient Medications  Medication Instructions   Accu-Chek FastClix Lancets MISC Check blood sugar once daily.   amLODipine (NORVASC) 10 mg, Oral, Every morning   ARIPiprazole (ABILIFY) 5 mg, Oral, Daily   ascorbic acid (VITAMIN C) 500 mg, Daily   atorvastatin (LIPITOR) 20 mg, Oral, Daily   desvenlafaxine (PRISTIQ) 100 mg, Oral, Daily   diclofenac (VOLTAREN) 75 mg, Oral, 2 times daily   DULoxetine (CYMBALTA) 30 mg, Oral, Daily   ferrous gluconate (FERGON) 324 MG tablet No dose, route, or frequency recorded.   glucose blood (ACCU-CHEK GUIDE) test strip Check blood sugar once daily.   losartan-hydrochlorothiazide (HYZAAR) 100-25 MG tablet 1 tablet, Oral, Daily   metFORMIN (GLUCOPHAGE) 500 mg, Oral, 2 times daily with meals   metoprolol succinate (TOPROL-XL) 100 mg, Oral, Daily   Multiple Vitamin (MULTIVITAMIN IRON-FREE) TABS 1 tablet, Daily   Multiple Vitamins-Minerals (HAIR SKIN AND NAILS FORMULA) TABS 1 tablet, Daily   Omega-3 Fatty Acids (FISH OIL) 1000 MG CAPS Take by mouth.   omeprazole (PRILOSEC) 20 mg,  Oral, Daily   traZODone (DESYREL) 100 MG tablet TAKE 2 TABLETS BY MOUTH AT  BEDTIME AS NEEDED FOR SLEEP   vitamin E 1000 UNIT capsule Take by mouth.    Patient Active Problem List   Diagnosis Date Noted   Acute right hip pain 12/18/2022   Prediabetes 07/31/2022   Arthritis of shoulder region, left 04/21/2022   Hyperlipidemia LDL goal <100 09/12/2018   Varicose veins with pain 08/31/2018   IDA (iron deficiency anemia) 08/17/2018   History of partial hysterectomy 02/03/2017   Pelvic peritoneal adhesions, female 01/19/2017   Anemia due to chronic illness 01/19/2017   Benign essential hypertension 01/19/2017   Tobacco abuse 01/19/2017   Post-operative state 01/19/2017   Cigarette nicotine dependence with nicotine-induced disorder 01/19/2017   Fibroid uterus 10/26/2016   Abdominal pain in female 10/26/2016   Bacterial vaginitis 10/26/2016   Anxiety 10/12/2016   Depression 10/12/2016   PTSD (post-traumatic stress disorder) 10/12/2016   Type 2 diabetes mellitus without complication, without long-term current use of insulin (HCC) 10/12/2016      Review of Systems  All other systems reviewed and are negative.     Objective:     BP (!) 154/99 (BP Location: Right Arm, Patient Position: Sitting, Cuff Size: Large)   Pulse 80   Temp 98.3 F (36.8 C) (Oral)   Wt 184 lb 4.8 oz (83.6 kg)   SpO2 98%   BMI 29.75 kg/m    Physical Exam Vitals reviewed.  Constitutional:      Appearance: Normal appearance. She is normal weight.  Cardiovascular:     Rate and Rhythm: Normal rate and regular rhythm.     Pulses: Normal pulses.     Heart sounds: Normal heart sounds. No murmur heard. Pulmonary:     Effort: Pulmonary effort is normal.     Breath sounds: Normal breath sounds. No wheezing or rales.  Musculoskeletal:     Right lower leg: No edema.     Left lower leg: No edema.  Neurological:     Mental Status: She is alert and oriented to person, place, and time. Mental status is at  baseline.  Psychiatric:        Mood and Affect: Mood normal.        Behavior: Behavior normal.      Results for orders placed or performed in visit on 04/13/23  POC HgB A1c  Result Value Ref Range   Hemoglobin A1C 7.5 (A) 4.0 - 5.6 %   HbA1c POC (<> result, manual entry)     HbA1c, POC (prediabetic range)     HbA1c, POC (controlled diabetic range)        The ASCVD Risk score (Arnett DK, et al., 2019) failed to calculate for the following reasons:   The valid total cholesterol range is 130 to 320 mg/dL    Assessment & Plan:  Type 2 diabetes mellitus without complication, without long-term current use of insulin (HCC) Assessment & Plan: A1C is in the diabetic range today, pt reports she has taken metformin in the past, did not have side effects to it previously, will restart this medication for her and see her back in 3 months for repeat A1C.   Orders: -     POCT glycosylated hemoglobin (Hb A1C) -     Comprehensive metabolic panel; Future -     Microalbumin / creatinine urine ratio; Future -     metFORMIN HCl; Take 1 tablet (500 mg total) by mouth 2 (two) times daily with a meal.  Dispense: 180 tablet; Refill: 1  Benign essential hypertension Assessment & Plan: Chronic, uncontrolled, pt is not in pain and is on her medications, likely it is elevated due to smoking before her visit. She reports she is getting high readings at home, will increase her metoprolol to 100 mg daily. I counseled her to watch out for low heart rate with the increase in dosage, she has a fit bit that monitors her HR. RTC in 3 months  Orders: -     Losartan Potassium-HCTZ; Take 1 tablet by mouth daily.  Dispense: 90 tablet; Refill: 3 -     Metoprolol Succinate ER; Take 1 tablet (100 mg total) by mouth daily.  Dispense: 90 tablet; Refill: 1     Return in about 3 months (around 07/12/2023) for DM, HTN.    Karie Georges, MD

## 2023-04-19 ENCOUNTER — Telehealth (HOSPITAL_COMMUNITY): Payer: Self-pay | Admitting: *Deleted

## 2023-04-19 MED ORDER — TRAZODONE HCL 100 MG PO TABS: 100.0000 mg | ORAL_TABLET | Freq: Every day | ORAL | 1 refills | Status: DC

## 2023-04-19 NOTE — Addendum Note (Signed)
Addended by: Thresa Ross on: 04/19/2023 10:53 AM   Modules accepted: Orders

## 2023-04-19 NOTE — Telephone Encounter (Signed)
[id number] [place], [place] - [address] AT [facility] [address]   traZODone (DESYREL) 100 MG tablet   NEXT APPT  05/05/23 LAST APPT   02/03/23

## 2023-05-05 ENCOUNTER — Telehealth (INDEPENDENT_AMBULATORY_CARE_PROVIDER_SITE_OTHER): Payer: Medicare HMO | Admitting: Psychiatry

## 2023-05-05 ENCOUNTER — Encounter (HOSPITAL_COMMUNITY): Payer: Self-pay | Admitting: Psychiatry

## 2023-05-05 DIAGNOSIS — F411 Generalized anxiety disorder: Secondary | ICD-10-CM | POA: Diagnosis not present

## 2023-05-05 DIAGNOSIS — F4310 Post-traumatic stress disorder, unspecified: Secondary | ICD-10-CM | POA: Diagnosis not present

## 2023-05-05 DIAGNOSIS — F5102 Adjustment insomnia: Secondary | ICD-10-CM | POA: Diagnosis not present

## 2023-05-05 DIAGNOSIS — F332 Major depressive disorder, recurrent severe without psychotic features: Secondary | ICD-10-CM

## 2023-05-05 MED ORDER — TRAZODONE HCL 100 MG PO TABS: 100.0000 mg | ORAL_TABLET | Freq: Every day | ORAL | 1 refills | Status: DC

## 2023-05-05 MED ORDER — DULOXETINE HCL 30 MG PO CPEP: 30.0000 mg | ORAL_CAPSULE | Freq: Every day | ORAL | 0 refills | Status: DC

## 2023-05-05 MED ORDER — DESVENLAFAXINE SUCCINATE ER 100 MG PO TB24: 100.0000 mg | ORAL_TABLET | Freq: Every day | ORAL | 0 refills | Status: DC

## 2023-05-05 NOTE — Progress Notes (Signed)
[facility] Follow up visit  Patient Identification: Sandra Bean MRN:  [id number] Date of Evaluation:  05/05/2023 Referral Source: primary care Chief Complaint:    Depression follow up  Visit Diagnosis:    ICD-10-CM   1. Severe episode of recurrent major depressive disorder, without psychotic features (HCC)  F33.2     2. GAD (generalized anxiety disorder)  F41.1     3. PTSD (post-traumatic stress disorder)  F43.10     4. Adjustment insomnia  F51.02     Virtual Visit via Video Note  I connected with Sandra Bean on 05/05/23 at 10:30 AM EST by a video enabled telemedicine application and verified that I am speaking with the correct person using two identifiers.  Location: Patient: parked car Provider: home office   I discussed the limitations of evaluation and management by telemedicine and the availability of in person appointments. The patient expressed understanding and agreed to proceed.     I discussed the assessment and treatment plan with the patient. The patient was provided an opportunity to ask questions and all were answered. The patient agreed with the plan and demonstrated an understanding of the instructions.   The patient was advised to call back or seek an in-person evaluation if the symptoms worsen or if the condition fails to improve as anticipated.  I provided 18 minutes of non-face-to-face time during this encounter.      History of Present Illness:  Sandra Bean is a 63  years old currently single African-American female initially referred by primary care physician for management of depression and PTSD.  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  On eval today, has started bus driving but many days off due to weather and doesn't get paid. Says future goal is to get experience and move on to more commercial driving   Did not fill the abilify due copayment . Says circumstances are challenging but  managing it still on pristiq, trazadone, cymbalta   Denies drug use Has a dog that helps.  Finances are challenging  Aggravating factors; multiple medical issues including diabetes.  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 can  Duration  20 plus years Severity; fair but can be subdued due to challenges in finances  Past Psychiatric History: depression    Past Medical History:  Past Medical History:  Diagnosis Date   Anemia    Anxiety    PTDS   Arthritis    RIGHT SIDE   COPD (chronic obstructive pulmonary disease) (HCC)    Depression    Diabetes mellitus without complication (HCC)    Type 2   Fibroids    Fibromyalgia    DAILY PAIN   GERD (gastroesophageal reflux disease)    H/O hernia repair 2009   Headache    Hypertension    Hyperthyroidism    NON PER PATIENT   Mental disorder    Mitral valve prolapse    Ovarian cyst     Past Surgical History:  Procedure Laterality Date   ABDOMINAL HYSTERECTOMY     BREAST SURGERY     FOOT SURGERY     HERNIA REPAIR     LAPAROTOMY Bilateral 01/19/2017   Procedure: MINI-LAPAROTOMY TO REMOVE UTERUS, BILATERAL FALLOPIAN TUBES AND BILATERAL OVARIES;  Surgeon: Willodean Rosenthal, MD;  Location: [facility];  Service: Gynecology;  Laterality: Bilateral;   NOSE SURGERY     ROBOTIC ASSISTED TOTAL HYSTERECTOMY WITH BILATERAL  SALPINGO OOPHERECTOMY Bilateral 01/19/2017   Procedure: ROBOTIC ASSISTED TOTAL LAPAROSCOPIC SUPRACERVICAL HYSTERECTOMY WITH BILATERAL SALPINGO OOPHORECTOMY WITH MINI LAPAROTOMY TO REMOVE SPECIMEN;  Surgeon: Willodean Rosenthal, MD;  Location: [facility];  Service: Gynecology;  Laterality: Bilateral;   THERAPEUTIC ABORTION      Family Psychiatric History: Parents : alcoholic  Family History:  Family History  Problem Relation Age of Onset   Depression Mother    Alcohol abuse Mother    Hypertension Father    Alcohol abuse Sister    Diabetes Sister    Hypertension Sister     Diabetes Brother    Hypertension Brother     Social History:   Social History   Socioeconomic History   Marital status: Divorced    Spouse name: Not on file   Number of children: Not on file   Years of education: Not on file   Highest education level: Not on file  Occupational History   Not on file  Tobacco Use   Smoking status: Every Day    Current packs/day: 1.00    Types: Cigarettes   Smokeless tobacco: Never  Vaping Use   Vaping status: Never Used  Substance and Sexual Activity   Alcohol use: No   Drug use: No   Sexual activity: Not Currently    Birth control/protection: Post-menopausal  Other Topics Concern   Not on file  Social History Narrative   Not on file   Social Drivers of Health   Financial Resource Strain: Medium Risk (06/21/2020)   Received from [facility], [facility]   Overall Financial Resource Strain (CARDIA)    Difficulty of Paying Living Expenses: Somewhat hard  Food Insecurity: No Food Insecurity (04/24/2021)   Received from [facility], [facility]   Hunger Vital Sign    Worried About Running Out of Food in the Last Year: Never true    Ran Out of Food in the Last Year: Never true  Transportation Needs: No Transportation Needs (06/21/2020)   Received from [facility]   PRAPARE - Transportation    Lack of Transportation (Medical): No    Lack of Transportation (Non-Medical): No  Physical Activity: Sufficiently Active (06/21/2020)   Received from [facility], [facility]   Exercise Vital Sign    Days of Exercise per Week: 3 days    Minutes of Exercise per Session: 60 min  Stress: Stress Concern Present (06/21/2020)   Received from [facility], [facility] Occupational [facility] - Occupational Stress Questionnaire    Feeling of Stress : Very much  Social Connections: Unknown (07/20/2021)   Received from [facility], [facility]   Social Network    Social Network: Not on file      Allergies:   Allergies  Allergen Reactions   Famotidine Nausea Only   Mirtazapine Other (See Comments)    Overly drowsy, couldn't function     Metabolic Disorder Labs: Lab Results  Component Value Date   HGBA1C 7.5 (A) 04/13/2023   No results found for: "PROLACTIN" Lab Results  Component Value Date   CHOL 124 07/31/2022   TRIG 78 07/31/2022   HDL 59 07/31/2022   CHOLHDL 2.1 07/31/2022   LDLCALC 49 07/31/2022   No results found for: "TSH"  Therapeutic Level Labs: No results found for: "LITHIUM" No results found for: "CBMZ" No results found for: "VALPROATE"  Current Medications: Current Outpatient Medications  Medication Sig Dispense Refill   Accu-Chek FastClix Lancets MISC Check blood sugar once  daily.     amLODipine (NORVASC) 10 MG tablet Take 1 tablet (10 mg total) by mouth every morning. 90 tablet 1   ARIPiprazole (ABILIFY) 5 MG tablet Take 1 tablet (5 mg total) by mouth daily. 30 tablet 1   atorvastatin (LIPITOR) 20 MG tablet Take 1 tablet (20 mg total) by mouth daily. 90 tablet 3   desvenlafaxine (PRISTIQ) 100 MG 24 hr tablet Take 1 tablet (100 mg total) by mouth daily. 90 tablet 0   diclofenac (VOLTAREN) 75 MG EC tablet Take 1 tablet (75 mg total) by mouth 2 (two) times daily. 180 tablet 1   DULoxetine (CYMBALTA) 30 MG capsule Take 1 capsule (30 mg total) by mouth daily. 90 capsule 0   ferrous gluconate (FERGON) 324 MG tablet      glucose blood (ACCU-CHEK GUIDE) test strip Check blood sugar once daily.     losartan-hydrochlorothiazide (HYZAAR) 100-25 MG tablet Take 1 tablet by mouth daily. 90 tablet 3   metFORMIN (GLUCOPHAGE) 500 MG tablet Take 1 tablet (500 mg total) by mouth 2 (two) times daily with a meal. 180 tablet 1   metoprolol succinate (TOPROL-XL) 100 MG 24 hr tablet Take 1 tablet (100 mg total) by mouth daily. 90 tablet 1   Multiple Vitamin (MULTIVITAMIN IRON-FREE) TABS Take 1 tablet by mouth daily.     Multiple Vitamins-Minerals (HAIR SKIN AND NAILS  FORMULA) TABS Take 1 tablet by mouth daily.      Omega-3 Fatty Acids (FISH OIL) 1000 MG CAPS Take by mouth.     omeprazole (PRILOSEC) 20 MG capsule Take 1 capsule (20 mg total) by mouth daily. 90 capsule 1   traZODone (DESYREL) 100 MG tablet Take 1 tablet (100 mg total) by mouth at bedtime. Take one to two at night 60 tablet 1   vitamin C (ASCORBIC ACID) 500 MG tablet Take 500 mg by mouth daily.     vitamin E 1000 UNIT capsule Take by mouth.     No current facility-administered medications for this visit.     Psychiatric Specialty Exam: Review of Systems  Cardiovascular:  Negative for chest pain.  Neurological:  Negative for tremors.  Psychiatric/Behavioral:  Negative for substance abuse and suicidal ideas.     There were no vitals taken for this visit.There is no height or weight on file to calculate BMI.  General Appearance: Casual  Eye Contact:  Fair  Speech:  Normal Rate  Volume:  Normal  Mood: fair  Affect:  Congruent  Thought Process:  Goal Directed  Orientation:  Full (Time, Place, and Person)  Thought Content:  Logical  Suicidal Thoughts:  No  Homicidal Thoughts:  No  Memory:  Immediate;   Fair Recent;   Fair  Judgement:  Fair  Insight:  Fair  Psychomotor Activity:  Normal  Concentration:  Concentration: Fair and Attention Span: Fair  Recall:  [employer] of Knowledge:Fair  Language: Fair  Akathisia:  No  Handed:  Right  AIMS (if indicated):  not done  Assets:  Desire for Improvement  ADL's:  intact  Cognition: WNL  Sleep:  Fair   Screenings: GAD-7    Flowsheet Row Office Visit from 04/13/2023 in [facility] at [employer] from 10/29/2016 in Center for [facility]  Total GAD-7 Score 8 20      PHQ2-9    Flowsheet Row Office Visit from 04/13/2023 in [facility] [place] Office Visit from 12/15/2022 in [facility] [place]  Office Visit from 05/21/2022 in [facility] [place] Office Visit from 10/29/2016 in [facility] for [facility]  PHQ-2 Total Score 4 6 6 6   PHQ-9 Total Score -- 27 21 24       Flowsheet Row Video Visit from 12/23/2022 in [facility] at [facility] Video Visit from 02/06/2022 in [facility] at [facility] Video Visit from 10/03/2021 in [facility] at [facility]  C-SSRS RISK CATEGORY No Risk No Risk No Risk       Assessment and Plan: as follows  Prior documentation reviewed  MDD moderate to severe:  fair, has hold off abilify, continue cymbalta, pristiq Working on keeping mind positive helps  GAD/ PTSD: stress and financial worries, provided supportive therapy, does not want to increase meds, continue pristiq   Insomnia: manageable with trazadone, continue sleep hygiene   Fu 6 m. Thresa Ross, MD 2/12/202510:39 AM

## 2023-05-06 ENCOUNTER — Telehealth (HOSPITAL_COMMUNITY): Payer: Self-pay | Admitting: *Deleted

## 2023-05-06 MED ORDER — TRAZODONE HCL 100 MG PO TABS: ORAL_TABLET | ORAL | 1 refills | Status: DC

## 2023-05-06 NOTE — Addendum Note (Signed)
Addended by: Thresa Ross on: 05/06/2023 12:44 PM   Modules accepted: Orders

## 2023-05-06 NOTE — Telephone Encounter (Signed)
[facility] FAXED FOR MISSING /INCORRECT INFORMATION::  DIRECTIONS ARE CONFLICTING ON THE ORIGINAL PRESCRIPTION FOR -- traZODone (DESYREL) 100 MG tablet DATED 05/05/23 Take 1 tablet (100 mg total) by mouth at bedtime. Take one to two at night, Starting Wed 05/05/2023, Normal   [facility] - [place], [place] - [address] [facility] AT Portal to Registered [facility] Sites

## 2023-05-27 ENCOUNTER — Other Ambulatory Visit: Payer: Self-pay | Admitting: Family Medicine

## 2023-05-27 DIAGNOSIS — I10 Essential (primary) hypertension: Secondary | ICD-10-CM

## 2023-05-27 DIAGNOSIS — M19012 Primary osteoarthritis, left shoulder: Secondary | ICD-10-CM

## 2023-05-31 ENCOUNTER — Other Ambulatory Visit: Payer: Self-pay | Admitting: Family Medicine

## 2023-05-31 DIAGNOSIS — K219 Gastro-esophageal reflux disease without esophagitis: Secondary | ICD-10-CM

## 2023-05-31 MED ORDER — OMEPRAZOLE 20 MG PO CPDR: 20.0000 mg | DELAYED_RELEASE_CAPSULE | Freq: Every day | ORAL | 1 refills | Status: DC

## 2023-05-31 NOTE — Telephone Encounter (Signed)
 Copied from CRM [id number]. Topic: Clinical - Medication Refill >> May 31, 2023 12:36 PM Aletta Edouard wrote: Most Recent Primary Care Visit:  Provider: Karie Georges  Department: [facility]  Visit Type: OFFICE VISIT  Date: 04/13/2023 omeprazole (PRILOSEC) 20 MG capsule Medication: atorvastatin (LIPITOR) 20 MG tablet metFORMIN (GLUCOPHAGE) 500 MG tablet metoprolol succinate (TOPROL-XL) 100 MG 24 hr   Has the patient contacted their pharmacy? Yes (Agent: If no, request that the patient contact the pharmacy for the refill. If patient does not wish to contact the pharmacy document the reason why and proceed with request.) (Agent: If yes, when and what did the pharmacy advise?)  Is this the correct pharmacy for this prescription? Yes If no, delete pharmacy and type the correct one.  This is the patient's preferred pharmacy:   [facility] - [place], [place] - One [facility] AT Portal to Registered [facility] Sites [address] [place] [address] Phone: [phone] Fax: [phone]   Has the prescription been filled recently? No  Is the patient out of the medication? Yes  Has the patient been seen for an appointment in the last year OR does the patient have an upcoming appointment? Yes  Can we respond through MyChart? No  Agent: Please be advised that Rx refills may take up to 3 business days. We ask that you follow-up with your pharmacy.

## 2023-06-03 ENCOUNTER — Telehealth: Payer: Self-pay | Admitting: Family Medicine

## 2023-06-03 DIAGNOSIS — I10 Essential (primary) hypertension: Secondary | ICD-10-CM

## 2023-06-03 DIAGNOSIS — K219 Gastro-esophageal reflux disease without esophagitis: Secondary | ICD-10-CM

## 2023-06-03 DIAGNOSIS — E119 Type 2 diabetes mellitus without complications: Secondary | ICD-10-CM

## 2023-06-03 MED ORDER — METOPROLOL SUCCINATE ER 100 MG PO TB24: 100.0000 mg | ORAL_TABLET | Freq: Every day | ORAL | 1 refills | Status: AC

## 2023-06-03 MED ORDER — METFORMIN HCL 500 MG PO TABS: 500.0000 mg | ORAL_TABLET | Freq: Two times a day (BID) | ORAL | 1 refills | Status: AC

## 2023-06-03 MED ORDER — ATORVASTATIN CALCIUM 20 MG PO TABS: 20.0000 mg | ORAL_TABLET | Freq: Every day | ORAL | 1 refills | Status: AC

## 2023-06-03 MED ORDER — OMEPRAZOLE 20 MG PO CPDR: 20.0000 mg | DELAYED_RELEASE_CAPSULE | Freq: Every day | ORAL | 1 refills | Status: AC

## 2023-06-03 NOTE — Telephone Encounter (Signed)
 R sent and left detailed message on patient vm informing her of this.

## 2023-06-03 NOTE — Telephone Encounter (Signed)
 Pt states she called Monday 05/31/23 to get some refills called in.  Her Omeprazole was sent to [facility], however it was supposed to be [facility] to [facility].  Pt needs the following prescriptions sent in for 90-day supply:  Metformin Metoprolol Succinate 100 mg Atorvastatin Omeprazole  Patient is requesting a call back once these are sent in.  She is out of Metformin and Atorvastatin and almost out of the others.    Pharmacy--[facility]

## 2023-06-04 ENCOUNTER — Other Ambulatory Visit

## 2023-06-11 ENCOUNTER — Ambulatory Visit (INDEPENDENT_AMBULATORY_CARE_PROVIDER_SITE_OTHER): Admitting: Adult Health

## 2023-06-11 VITALS — BP 130/82 | HR 112 | Temp 98.4°F | Ht 66.0 in | Wt 187.0 lb

## 2023-06-11 DIAGNOSIS — J988 Other specified respiratory disorders: Secondary | ICD-10-CM | POA: Diagnosis not present

## 2023-06-11 DIAGNOSIS — Z720 Tobacco use: Secondary | ICD-10-CM

## 2023-06-11 DIAGNOSIS — Z7984 Long term (current) use of oral hypoglycemic drugs: Secondary | ICD-10-CM | POA: Diagnosis not present

## 2023-06-11 DIAGNOSIS — E119 Type 2 diabetes mellitus without complications: Secondary | ICD-10-CM | POA: Diagnosis not present

## 2023-06-11 LAB — COMPREHENSIVE METABOLIC PANEL
ALT: 13 U/L (ref 0–35)
AST: 16 U/L (ref 0–37)
Albumin: 4 g/dL (ref 3.5–5.2)
Alkaline Phosphatase: 80 U/L (ref 39–117)
BUN: 9 mg/dL (ref 6–23)
CO2: 33 meq/L — ABNORMAL HIGH (ref 19–32)
Calcium: 9 mg/dL (ref 8.4–10.5)
Chloride: 100 meq/L (ref 96–112)
Creatinine, Ser: 0.64 mg/dL (ref 0.40–1.20)
GFR: 93.64 mL/min (ref >60.00)
Glucose, Bld: 147 mg/dL — ABNORMAL HIGH (ref 70–99)
Potassium: 3.1 meq/L — ABNORMAL LOW (ref 3.5–5.1)
Sodium: 139 meq/L (ref 135–145)
Total Bilirubin: 0.4 mg/dL (ref 0.2–1.2)
Total Protein: 7 g/dL (ref 6.0–8.3)

## 2023-06-11 MED ORDER — DOXYCYCLINE HYCLATE 100 MG PO CAPS: 100.0000 mg | ORAL_CAPSULE | Freq: Two times a day (BID) | ORAL | 0 refills | Status: DC

## 2023-06-11 MED ORDER — METHYLPREDNISOLONE 4 MG PO TBPK: ORAL_TABLET | ORAL | 0 refills | Status: DC

## 2023-06-11 NOTE — Progress Notes (Signed)
 Subjective:    Patient ID: Sandra Bean, female    DOB: [birth year], 63 y.o.   MRN: [id number]  Sore Throat    63 year old female who  has a past medical history of Anemia, Anxiety, Arthritis, COPD (chronic obstructive pulmonary disease) (HCC), Depression, Diabetes mellitus without complication (HCC), Fibroids, Fibromyalgia, GERD (gastroesophageal reflux disease), H/O hernia repair (2009), Headache, Hypertension, Hyperthyroidism, Mental disorder, Mitral valve prolapse, and Ovarian cyst.  She is a patient of Dr. Casimiro Needle who I am seeing today for an acute visit. She reports that she felt great 4 days ago. Since that time she has developed a semi productive cough with discolored sputum, , nasal congestion with discolored rhinorrhea, shortness of breath, chest congestion, and fatigue   She denies fevers, sinus pressure, ear pain.   She has been using mucinex and and OTC cold and sinus medication.  She does smoke.    Review of Systems See HPI   Past Medical History:  Diagnosis Date   Anemia    Anxiety    PTDS   Arthritis    RIGHT SIDE   COPD (chronic obstructive pulmonary disease) (HCC)    Depression    Diabetes mellitus without complication (HCC)    Type 2   Fibroids    Fibromyalgia    DAILY PAIN   GERD (gastroesophageal reflux disease)    H/O hernia repair 2009   Headache    Hypertension    Hyperthyroidism    NON PER PATIENT   Mental disorder    Mitral valve prolapse    Ovarian cyst     Social History   Socioeconomic History   Marital status: Divorced    Spouse name: Not on file   Number of children: Not on file   Years of education: Not on file   Highest education level: Not on file  Occupational History   Not on file  Tobacco Use   Smoking status: Every Day    Current packs/day: 1.00    Types: Cigarettes   Smokeless tobacco: Never  Vaping Use   Vaping status: Never Used  Substance and Sexual Activity   Alcohol use: No   Drug use: No   Sexual  activity: Not Currently    Birth control/protection: Post-menopausal  Other Topics Concern   Not on file  Social History Narrative   Not on file   Social Drivers of Health   Financial Resource Strain: Medium Risk (06/21/2020)   Received from [facility], [facility]   Overall Financial Resource Strain (CARDIA)    Difficulty of Paying Living Expenses: Somewhat hard  Food Insecurity: No Food Insecurity (04/24/2021)   Received from [facility], [facility]   Hunger Vital Sign    Worried About Running Out of Food in the Last Year: Never true    Ran Out of Food in the Last Year: Never true  Transportation Needs: No Transportation Needs (06/21/2020)   Received from [facility], [facility]   PRAPARE - Transportation    Lack of Transportation (Medical): No    Lack of Transportation (Non-Medical): No  Physical Activity: Sufficiently Active (06/21/2020)   Received from [facility], [facility]   Exercise Vital Sign    Days of Exercise per Week: 3 days    Minutes of Exercise per Session: 60 min  Stress: Stress Concern Present (06/21/2020)   Received from [facility], [facility] - Occupational Stress Questionnaire    Feeling of Stress :  Very much  Social Connections: Unknown (07/20/2021)   Received from [facility], [facility]   Social Network    Social Network: Not on file  Intimate Partner Violence: Unknown (06/23/2021)   Received from [facility], [facility]   HITS    Physically Hurt: Not on file    Insult or Talk Down To: Not on file    Threaten Physical Harm: Not on file    Scream or Curse: Not on file    Past Surgical History:  Procedure Laterality Date   ABDOMINAL HYSTERECTOMY     BREAST SURGERY     FOOT SURGERY     HERNIA REPAIR     LAPAROTOMY Bilateral 01/19/2017   Procedure: MINI-LAPAROTOMY TO REMOVE UTERUS, BILATERAL FALLOPIAN TUBES AND BILATERAL OVARIES;  Surgeon: Willodean Rosenthal, MD;  Location:  [facility];  Service: Gynecology;  Laterality: Bilateral;   NOSE SURGERY     ROBOTIC ASSISTED TOTAL HYSTERECTOMY WITH BILATERAL SALPINGO OOPHERECTOMY Bilateral 01/19/2017   Procedure: ROBOTIC ASSISTED TOTAL LAPAROSCOPIC SUPRACERVICAL HYSTERECTOMY WITH BILATERAL SALPINGO OOPHORECTOMY WITH MINI LAPAROTOMY TO REMOVE SPECIMEN;  Surgeon: Willodean Rosenthal, MD;  Location: [facility];  Service: Gynecology;  Laterality: Bilateral;   THERAPEUTIC ABORTION      Family History  Problem Relation Age of Onset   Depression Mother    Alcohol abuse Mother    Hypertension Father    Alcohol abuse Sister    Diabetes Sister    Hypertension Sister    Diabetes Brother    Hypertension Brother     Allergies  Allergen Reactions   Famotidine Nausea Only   Mirtazapine Other (See Comments)    Overly drowsy, couldn't function     Current Outpatient Medications on File Prior to Visit  Medication Sig Dispense Refill   Accu-Chek FastClix Lancets MISC Check blood sugar once daily.     amLODipine (NORVASC) 10 MG tablet Take 1 tablet (10 mg total) by mouth every morning. 90 tablet 1   ARIPiprazole (ABILIFY) 5 MG tablet Take 1 tablet (5 mg total) by mouth daily. 30 tablet 1   atorvastatin (LIPITOR) 20 MG tablet Take 1 tablet (20 mg total) by mouth daily. 90 tablet 1   desvenlafaxine (PRISTIQ) 100 MG 24 hr tablet Take 1 tablet (100 mg total) by mouth daily. 90 tablet 0   diclofenac (VOLTAREN) 75 MG EC tablet TAKE 1 TABLET TWICE A DAY 180 tablet 1   DULoxetine (CYMBALTA) 30 MG capsule Take 1 capsule (30 mg total) by mouth daily. 90 capsule 0   ferrous gluconate (FERGON) 324 MG tablet      glucose blood (ACCU-CHEK GUIDE) test strip Check blood sugar once daily.     losartan-hydrochlorothiazide (HYZAAR) 100-25 MG tablet TAKE 1 TABLET DAILY 90 tablet 1   metFORMIN (GLUCOPHAGE) 500 MG tablet Take 1 tablet (500 mg total) by mouth 2 (two) times daily with a meal. 180 tablet 1   metoprolol succinate (TOPROL-XL) 100 MG 24 hr  tablet Take 1 tablet (100 mg total) by mouth daily. 90 tablet 1   Multiple Vitamin (MULTIVITAMIN IRON-FREE) TABS Take 1 tablet by mouth daily.     Multiple Vitamins-Minerals (HAIR SKIN AND NAILS FORMULA) TABS Take 1 tablet by mouth daily.      Omega-3 Fatty Acids (FISH OIL) 1000 MG CAPS Take by mouth.     omeprazole (PRILOSEC) 20 MG capsule Take 1 capsule (20 mg total) by mouth daily. 90 capsule 1   traZODone (DESYREL) 100 MG tablet Take one to two at night 60  tablet 1   vitamin C (ASCORBIC ACID) 500 MG tablet Take 500 mg by mouth daily.     vitamin E 1000 UNIT capsule Take by mouth.     [DISCONTINUED] prazosin (MINIPRESS) 5 MG capsule Take 5 mg by mouth at bedtime.      No current facility-administered medications on file prior to visit.    BP 130/82   Pulse (!) 112   Temp 98.4 F (36.9 C) (Oral)   Ht 5\' 6"  (1.676 m)   Wt 187 lb (84.8 kg)   SpO2 94%   BMI 30.18 kg/m       Objective:   Physical Exam Vitals and nursing note reviewed.  Constitutional:      Appearance: Normal appearance.  HENT:     Nose: Congestion and rhinorrhea present. Rhinorrhea is purulent.     Right Turbinates: Enlarged and swollen.     Left Turbinates: Enlarged and swollen.     Right Sinus: No maxillary sinus tenderness or frontal sinus tenderness.     Left Sinus: No maxillary sinus tenderness or frontal sinus tenderness.  Cardiovascular:     Rate and Rhythm: Normal rate and regular rhythm.     Pulses: Normal pulses.     Heart sounds: Normal heart sounds.  Pulmonary:     Effort: Pulmonary effort is normal.     Breath sounds: Normal breath sounds.  Neurological:     Mental Status: She is alert.  Psychiatric:        Mood and Affect: Mood normal.        Behavior: Behavior normal.        Thought Content: Thought content normal.        Judgment: Judgment normal.        Assessment & Plan:  1. Respiratory infection (Primary) - Will treat due to symptoms and smoking history.  - She can continue  with mucinex - Increase hydration - Follow up if not improving in the next 3-4 days  - doxycycline (VIBRAMYCIN) 100 MG capsule; Take 1 capsule (100 mg total) by mouth 2 (two) times daily.  Dispense: 14 capsule; Refill: 0 - methylPREDNISolone (MEDROL DOSEPAK) 4 MG TBPK tablet; Take as directed  Dispense: 21 tablet; Refill: 0  2. Tobacco abuse - Encouraged to quit smoking    Shirline Frees, NP

## 2023-06-14 LAB — MICROALBUMIN / CREATININE URINE RATIO
Creatinine,U: 27.1 mg/dL
Microalb Creat Ratio: 48.2 mg/g — ABNORMAL HIGH (ref 0.0–30.0)
Microalb, Ur: 1.3 mg/dL (ref 0.0–1.9)

## 2023-06-17 ENCOUNTER — Other Ambulatory Visit: Payer: Self-pay | Admitting: Family Medicine

## 2023-06-17 ENCOUNTER — Encounter: Payer: Self-pay | Admitting: Family Medicine

## 2023-06-17 DIAGNOSIS — E876 Hypokalemia: Secondary | ICD-10-CM

## 2023-06-17 MED ORDER — POTASSIUM CHLORIDE CRYS ER 20 MEQ PO TBCR: 20.0000 meq | EXTENDED_RELEASE_TABLET | Freq: Every day | ORAL | 1 refills | Status: DC

## 2023-07-13 ENCOUNTER — Ambulatory Visit (INDEPENDENT_AMBULATORY_CARE_PROVIDER_SITE_OTHER): Payer: Medicare HMO | Admitting: Family Medicine

## 2023-07-13 ENCOUNTER — Encounter: Payer: Self-pay | Admitting: Family Medicine

## 2023-07-13 VITALS — BP 130/88 | HR 75 | Temp 98.7°F | Wt 177.3 lb

## 2023-07-13 DIAGNOSIS — E119 Type 2 diabetes mellitus without complications: Secondary | ICD-10-CM | POA: Diagnosis not present

## 2023-07-13 DIAGNOSIS — D508 Other iron deficiency anemias: Secondary | ICD-10-CM | POA: Diagnosis not present

## 2023-07-13 DIAGNOSIS — I10 Essential (primary) hypertension: Secondary | ICD-10-CM | POA: Diagnosis not present

## 2023-07-13 DIAGNOSIS — D638 Anemia in other chronic diseases classified elsewhere: Secondary | ICD-10-CM | POA: Diagnosis not present

## 2023-07-13 DIAGNOSIS — Z1231 Encounter for screening mammogram for malignant neoplasm of breast: Secondary | ICD-10-CM

## 2023-07-13 DIAGNOSIS — Z7984 Long term (current) use of oral hypoglycemic drugs: Secondary | ICD-10-CM | POA: Diagnosis not present

## 2023-07-13 LAB — POCT GLYCOSYLATED HEMOGLOBIN (HGB A1C): Hemoglobin A1C: 7.2 % — AB (ref 4.0–5.6)

## 2023-07-13 LAB — CBC WITH DIFFERENTIAL/PLATELET
Basophils Absolute: 0 10*3/uL (ref 0.0–0.1)
Basophils Relative: 0.6 % (ref 0.0–3.0)
Eosinophils Absolute: 0 10*3/uL (ref 0.0–0.7)
Eosinophils Relative: 0.7 % (ref 0.0–5.0)
HCT: 37.6 % (ref 36.0–46.0)
Hemoglobin: 12.5 g/dL (ref 12.0–15.0)
Lymphocytes Relative: 33.1 % (ref 12.0–46.0)
Lymphs Abs: 2.2 10*3/uL (ref 0.7–4.0)
MCHC: 33.2 g/dL (ref 30.0–36.0)
MCV: 83.2 fl (ref 78.0–100.0)
Monocytes Absolute: 0.5 10*3/uL (ref 0.1–1.0)
Monocytes Relative: 6.9 % (ref 3.0–12.0)
Neutro Abs: 3.9 10*3/uL (ref 1.4–7.7)
Neutrophils Relative %: 58.7 % (ref 43.0–77.0)
Platelets: 250 10*3/uL (ref 150.0–400.0)
RBC: 4.52 Mil/uL (ref 3.87–5.11)
RDW: 13.9 % (ref 11.5–15.5)
WBC: 6.6 10*3/uL (ref 4.0–10.5)

## 2023-07-13 MED ORDER — TIRZEPATIDE 2.5 MG/0.5ML ~~LOC~~ SOAJ: 2.5000 mg | SUBCUTANEOUS | 3 refills | Status: DC

## 2023-07-13 MED ORDER — TIRZEPATIDE 2.5 MG/0.5ML ~~LOC~~ SOAJ
2.5000 mg | SUBCUTANEOUS | 1 refills | Status: DC
Start: 2023-07-13 — End: 2023-07-13

## 2023-07-13 MED ORDER — TIRZEPATIDE 2.5 MG/0.5ML ~~LOC~~ SOAJ: 2.5000 mg | SUBCUTANEOUS | 1 refills | Status: DC

## 2023-07-13 NOTE — Progress Notes (Signed)
 Established Patient Office Visit  Subjective   Patient ID: Sandra Bean, female    DOB: 10/28/1960  Age: 63 y.o. MRN: [id number]  Chief Complaint  Patient presents with   Medical Management of Chronic Issues    Pt is reporting that the metformin  is "making her stomach hurt" states that it is upsetting to her stomach, feeling nauseated, crampy and gurgling. Had diarrhea from the metformin  initially. A1C today is 7.2, slightly better, we discussed adding mounjaro to replace the metformin  and she is agreeable to trying this medication. Foot exam performed today and is WNL.   Anemia-- chronic, pt continues to take iron supplements at home. She reports some mild chronic fatigue only. No dizziness or passing out. Needs new blood work today for monitoring.     Current Outpatient Medications  Medication Instructions   Accu-Chek FastClix Lancets MISC Check blood sugar once daily.   amLODipine  (NORVASC ) 10 mg, Oral, Every morning   ARIPiprazole  (ABILIFY ) 5 mg, Oral, Daily   ascorbic acid (VITAMIN C) 500 mg, Daily   atorvastatin  (LIPITOR) 20 mg, Oral, Daily   desvenlafaxine  (PRISTIQ ) 100 mg, Oral, Daily   diclofenac  (VOLTAREN ) 75 mg, Oral, 2 times daily   DULoxetine  (CYMBALTA ) 30 mg, Oral, Daily   ferrous gluconate (FERGON) 324 MG tablet No dose, route, or frequency recorded.   glucose blood (ACCU-CHEK GUIDE) test strip Check blood sugar once daily.   losartan -hydrochlorothiazide  (HYZAAR) 100-25 MG tablet 1 tablet, Oral, Daily   metFORMIN  (GLUCOPHAGE ) 500 mg, Oral, 2 times daily with meals   metoprolol  succinate (TOPROL -XL) 100 mg, Oral, Daily   Multiple Vitamin (MULTIVITAMIN IRON-FREE) TABS 1 tablet, Daily   Multiple Vitamins-Minerals (HAIR SKIN AND NAILS FORMULA) TABS 1 tablet, Daily   Omega-3 Fatty Acids (FISH OIL) 1000 MG CAPS Take by mouth.   omeprazole  (PRILOSEC) 20 mg, Oral, Daily   potassium chloride  SA (KLOR-CON  M) 20 MEQ tablet 20 mEq, Oral, Daily   tirzepatide (MOUNJARO) 2.5  mg, Subcutaneous, Weekly   traZODone  (DESYREL ) 100 MG tablet Take one to two at night   vitamin E 1000 UNIT capsule Take by mouth.    Patient Active Problem List   Diagnosis Date Noted   Acute right hip pain 12/18/2022   Arthritis of shoulder region, left 04/21/2022   Hyperlipidemia LDL goal <100 09/12/2018   Varicose veins with pain 08/31/2018   IDA (iron deficiency anemia) 08/17/2018   History of partial hysterectomy 02/03/2017   Pelvic peritoneal adhesions, female 01/19/2017   Anemia due to chronic illness 01/19/2017   Benign essential hypertension 01/19/2017   Tobacco abuse 01/19/2017   Post-operative state 01/19/2017   Cigarette nicotine dependence with nicotine-induced disorder 01/19/2017   Fibroid uterus 10/26/2016   Abdominal pain in female 10/26/2016   Bacterial vaginitis 10/26/2016   Anxiety 10/12/2016   Depression 10/12/2016   PTSD (post-traumatic stress disorder) 10/12/2016   Type 2 diabetes mellitus without complication, without long-term current use of insulin (HCC) 10/12/2016      Review of Systems  All other systems reviewed and are negative.     Objective:     BP 130/88 (BP Location: Right Arm, Patient Position: Sitting, Cuff Size: Normal)   Pulse 75   Temp 98.7 F (37.1 C) (Oral)   Wt 177 lb 4.8 oz (80.4 kg)   SpO2 98%   BMI 28.62 kg/m    Physical Exam Vitals reviewed.  Constitutional:      Appearance: Normal appearance. She is normal weight.  Neurological:  Mental Status: She is alert.    Diabetic Foot Exam - Simple   Simple Foot Form Diabetic Foot exam was performed with the following findings: Yes 07/13/2023 11:53 AM  Visual Inspection No deformities, no ulcerations, no other skin breakdown bilaterally: Yes Sensation Testing Intact to touch and monofilament testing bilaterally: Yes Pulse Check Posterior Tibialis and Dorsalis pulse intact bilaterally: Yes Comments       The ASCVD Risk score (Arnett DK, et al., 2019) failed to  calculate for the following reasons:   The valid total cholesterol range is 130 to 320 mg/dL    Assessment & Plan:  Type 2 diabetes mellitus without complication, without long-term current use of insulin (HCC) Assessment & Plan: Metformin  is causing an upset stomach. We discussed switching her to tirzepatide, counseled patient on risks/benefits of the medication and she would like to try the medication. Rx sent for 2.5 mg weekly. Will see her back in 3 months for repeat A1C check. Foot exam performed today.   Orders: -     POCT glycosylated hemoglobin (Hb A1C) -     Collection capillary blood specimen -     Tirzepatide; Inject 2.5 mg into the skin once a week.  Dispense: 2 mL; Refill: 3  Anemia due to chronic illness Assessment & Plan: Chronic, on iron supplements daily, will check new labs for surveillance.   Orders: -     CBC with Differential/Platelet; Future -     Iron, TIBC and Ferritin Panel; Future  Breast cancer screening by mammogram -     3D Screening Mammogram, Left and Right; Future  Benign essential hypertension Assessment & Plan: Chronic, BP is much better today, last visit we had increased her metoprolol  to 100 mg daily and this seems to be working well for her. Continue medications listed below: Current hypertension medications:       Sig   amLODipine  (NORVASC ) 10 MG tablet (Taking) Take 1 tablet (10 mg total) by mouth every morning.   losartan -hydrochlorothiazide  (HYZAAR) 100-25 MG tablet (Taking) TAKE 1 TABLET DAILY   metoprolol  succinate (TOPROL -XL) 100 MG 24 hr tablet (Taking) Take 1 tablet (100 mg total) by mouth daily.   prazosin  (MINIPRESS ) 5 MG capsule (Discontinued) Take 5 mg by mouth at bedtime.             Return in about 3 months (around 10/12/2023).    Aida House, MD

## 2023-07-13 NOTE — Progress Notes (Signed)
 Printed Rx faxed to [facility] at [phone].

## 2023-07-14 LAB — IRON,TIBC AND FERRITIN PANEL
%SAT: 14 % — ABNORMAL LOW (ref 16–45)
Ferritin: 16 ng/mL (ref 16–288)
Iron: 47 ug/dL (ref 45–160)
TIBC: 344 ug/dL (ref 250–450)

## 2023-07-15 ENCOUNTER — Encounter: Payer: Self-pay | Admitting: Family Medicine

## 2023-07-17 NOTE — Assessment & Plan Note (Signed)
 Chronic, BP is much better today, last visit we had increased her metoprolol  to 100 mg daily and this seems to be working well for her. Continue medications listed below: Current hypertension medications:       Sig   amLODipine  (NORVASC ) 10 MG tablet (Taking) Take 1 tablet (10 mg total) by mouth every morning.   losartan -hydrochlorothiazide  (HYZAAR) 100-25 MG tablet (Taking) TAKE 1 TABLET DAILY   metoprolol  succinate (TOPROL -XL) 100 MG 24 hr tablet (Taking) Take 1 tablet (100 mg total) by mouth daily.   prazosin  (MINIPRESS ) 5 MG capsule (Discontinued) Take 5 mg by mouth at bedtime.

## 2023-07-17 NOTE — Assessment & Plan Note (Signed)
 Metformin  is causing an upset stomach. We discussed switching her to tirzepatide, counseled patient on risks/benefits of the medication and she would like to try the medication. Rx sent for 2.5 mg weekly. Will see her back in 3 months for repeat A1C check. Foot exam performed today.

## 2023-07-17 NOTE — Assessment & Plan Note (Signed)
 Chronic, on iron supplements daily, will check new labs for surveillance.

## 2023-08-09 ENCOUNTER — Telehealth (HOSPITAL_COMMUNITY): Payer: Self-pay | Admitting: *Deleted

## 2023-08-09 DIAGNOSIS — F332 Major depressive disorder, recurrent severe without psychotic features: Secondary | ICD-10-CM

## 2023-08-09 MED ORDER — DULOXETINE HCL 30 MG PO CPEP: 30.0000 mg | ORAL_CAPSULE | Freq: Every day | ORAL | 0 refills | Status: DC

## 2023-08-09 MED ORDER — ARIPIPRAZOLE 5 MG PO TABS: 5.0000 mg | ORAL_TABLET | Freq: Every day | ORAL | 1 refills | Status: DC

## 2023-08-09 MED ORDER — TRAZODONE HCL 100 MG PO TABS: ORAL_TABLET | ORAL | 1 refills | Status: DC

## 2023-08-09 NOTE — Telephone Encounter (Signed)
 Rx Request [facility] -   desvenlafaxine  (PRISTIQ ) 100 MG 24 hr tablet   DULoxetine  (CYMBALTA ) 30 MG capsule   traZODone  (DESYREL ) 100 MG tablet   NEXT APPT    11/05/23 LAST  APPT   05/05/23

## 2023-08-09 NOTE — Addendum Note (Signed)
 Addended by: Wray Heady on: 08/09/2023 04:56 PM   Modules accepted: Orders

## 2023-08-16 ENCOUNTER — Other Ambulatory Visit (HOSPITAL_COMMUNITY): Payer: Self-pay | Admitting: Psychiatry

## 2023-08-16 ENCOUNTER — Other Ambulatory Visit: Payer: Self-pay | Admitting: Family Medicine

## 2023-08-16 DIAGNOSIS — I10 Essential (primary) hypertension: Secondary | ICD-10-CM

## 2023-08-24 ENCOUNTER — Ambulatory Visit (INDEPENDENT_AMBULATORY_CARE_PROVIDER_SITE_OTHER): Admitting: Family Medicine

## 2023-08-24 DIAGNOSIS — Z0000 Encounter for general adult medical examination without abnormal findings: Secondary | ICD-10-CM

## 2023-08-24 DIAGNOSIS — Z1231 Encounter for screening mammogram for malignant neoplasm of breast: Secondary | ICD-10-CM

## 2023-08-24 DIAGNOSIS — Z122 Encounter for screening for malignant neoplasm of respiratory organs: Secondary | ICD-10-CM

## 2023-08-24 NOTE — Progress Notes (Signed)
 PATIENT CHECK-IN and HEALTH RISK ASSESSMENT QUESTIONNAIRE:  -completed by phone/video for upcoming Medicare Preventive Visit   Pre-Visit Check-in: 1)Vitals (height, wt, BP, etc) - record in vitals section for visit on day of visit Request home vitals (wt, BP, etc.) and enter into vitals, THEN update Vital Signs SmartPhrase below at the top of the HPI. See below.  2)Review and Update Medications, Allergies PMH, Surgeries, Social history in Epic 3)Hospitalizations in the last year with date/reason? no  4)Review and Update Care Team (patient's specialists) in Epic 5) Complete PHQ9 in Epic  6) Complete Fall Screening in Epic 7)Review all Health Maintenance Due and order under PCP if not done.  Medicare Wellness Patient Questionnaire:  Answer theses question about your habits: How often do you have a drink containing alcohol? 1-2 times per year How many drinks containing alcohol do you have on a typical day when you are drinking?1-2 How often do you have six or more drinks on one occasion?never Have you ever smoked? Still smokes How many packs a day do/did you smoke? Less than 1ppd; she got nicotine patches and quit after our last conversation, but restarted when hit a stressful time - is smoking less though and still wants to quit but feels still might be a tough time Do you use smokeless tobacco?n Do you use an illicit drugs?n Reports is not sexually active On average, how many days per week do you engage in moderate to strenuous exercise (like a brisk walk)? 12,000 steps per day, 2 walks per day with dog, also lives on 3rd floor and takes the stairs - goes up and down at least 7x a day Typically does first meal around 11: breakfast food or sushi or salmon and bagel Typical dinner: cooks a healthy dinner usually Typical snacks: desert, fruit  Beverages: water, crystal light or sometimes a diet soda  Answer theses question about your everyday activities: Can you perform most household  chores?y Are you deaf or have significant trouble hearing?n Do you feel that you have a problem with memory?n Do you feel safe at home?y Last dentist visit? Goes on regular basis 8. Do you have any difficulty performing your everyday activities?n Are you having any difficulty walking, taking medications on your own, and or difficulty managing daily home needs?n Do you have difficulty walking or climbing stairs?n Do you have difficulty dressing or bathing?n Do you have difficulty doing errands alone such as visiting a doctor's office or shopping?n Do you currently have any difficulty preparing food and eating?n Do you currently have any difficulty using the toilet?n Do you have any difficulty managing your finances?n Do you have any difficulties with housekeeping of managing your housekeeping?n   Do you have Advanced Directives in place (Living Will, Healthcare Power or Attorney)? Working on this   Last eye Exam and location? Reports has been about 1 year, reports sees [facility]   Do you currently use prescribed or non-prescribed narcotic or opioid pain medications?n  Do you have a history or close family history of breast, ovarian, tubal or peritoneal cancer or a family member with BRCA (breast cancer susceptibility 1 and 2) gene mutations? no      ----------------------------------------------------------------------------------------------------------------------------------------------------------------------------------------------------------------------  Because this visit was a virtual/telehealth visit, some criteria may be missing or patient reported. Any vitals not documented were not able to be obtained and vitals that have been documented are patient reported.    MEDICARE ANNUAL PREVENTIVE VISIT WITH PROVIDER: (Welcome to Medicare, initial annual wellness or annual  wellness exam)  Virtual Visit via Phone Note  I connected with Sandra Bean on 08/24/23 by phone  and verified that I am speaking with the correct person using two identifiers.  Location patient: in the car in [place] Location provider:work or home office Persons participating in the virtual visit: patient, provider  Concerns and/or follow up today: no concerns.   See HM section in Epic for other details of completed HM.    ROS: negative for report of fevers, unintentional weight loss, vision changes, vision loss, hearing loss or change, chest pain, sob, hemoptysis, melena, hematochezia, hematuria, falls, bleeding or bruising, thoughts of suicide or self harm, memory loss  Patient-completed extensive health risk assessment - reviewed and discussed with the patient: See Health Risk Assessment completed with patient prior to the visit either above or in recent phone note. This was reviewed in detailed with the patient today and appropriate recommendations, orders and referrals were placed as needed per Summary below and patient instructions.   Review of Medical History: -PMH, PSH, Family History and current specialty and care providers reviewed and updated and listed below   Patient Care Team: Aida House, MD as PCP - General (Family Medicine)   Past Medical History:  Diagnosis Date   Anemia    Anxiety    PTDS   Arthritis    RIGHT SIDE   COPD (chronic obstructive pulmonary disease) (HCC)    Depression    Diabetes mellitus without complication (HCC)    Type 2   Fibroids    Fibromyalgia    DAILY PAIN   GERD (gastroesophageal reflux disease)    H/O hernia repair 2009   Headache    Hypertension    Hyperthyroidism    NON PER PATIENT   Mental disorder    Mitral valve prolapse    Ovarian cyst     Past Surgical History:  Procedure Laterality Date   ABDOMINAL HYSTERECTOMY     BREAST SURGERY     FOOT SURGERY     HERNIA REPAIR     LAPAROTOMY Bilateral 01/19/2017   Procedure: MINI-LAPAROTOMY TO REMOVE UTERUS, BILATERAL FALLOPIAN TUBES AND BILATERAL OVARIES;  Surgeon:  Lenord Radon, MD;  Location: [facility];  Service: Gynecology;  Laterality: Bilateral;   NOSE SURGERY     ROBOTIC ASSISTED TOTAL HYSTERECTOMY WITH BILATERAL SALPINGO OOPHERECTOMY Bilateral 01/19/2017   Procedure: ROBOTIC ASSISTED TOTAL LAPAROSCOPIC SUPRACERVICAL HYSTERECTOMY WITH BILATERAL SALPINGO OOPHORECTOMY WITH MINI LAPAROTOMY TO REMOVE SPECIMEN;  Surgeon: Lenord Radon, MD;  Location: [facility];  Service: Gynecology;  Laterality: Bilateral;   THERAPEUTIC ABORTION      Social History   Socioeconomic History   Marital status: Divorced    Spouse name: Not on file   Number of children: Not on file   Years of education: Not on file   Highest education level: Associate degree: academic program  Occupational History   Not on file  Tobacco Use   Smoking status: Every Day    Current packs/day: 1.00    Types: Cigarettes   Smokeless tobacco: Never  Vaping Use   Vaping status: Never Used  Substance and Sexual Activity   Alcohol use: No   Drug use: No   Sexual activity: Not Currently    Birth control/protection: Post-menopausal  Other Topics Concern   Not on file  Social History Narrative   Not on file   Social Drivers of Health   Financial Resource Strain: High Risk (08/23/2023)   Overall Financial Resource Strain (CARDIA)  Difficulty of Paying Living Expenses: Very hard  Food Insecurity: Food Insecurity Present (08/23/2023)   Hunger Vital Sign    Worried About Running Out of Food in the Last Year: Often true    Ran Out of Food in the Last Year: Often true  Transportation Needs: No Transportation Needs (08/23/2023)   PRAPARE - Administrator, Civil Service (Medical): No    Lack of Transportation (Non-Medical): No  Physical Activity: Insufficiently Active (08/23/2023)   Exercise Vital Sign    Days of Exercise per Week: 7 days    Minutes of Exercise per Session: 20 min  Stress: Stress Concern Present (08/23/2023)   [employer] of Occupational Health  - Occupational Stress Questionnaire    Feeling of Stress : Very much  Social Connections: Moderately Isolated (08/23/2023)   Social Connection and Isolation Panel [NHANES]    Frequency of Communication with Friends and Family: More than three times a week    Frequency of Social Gatherings with Friends and Family: Not on file    Attends Religious Services: More than 4 times per year    Active Member of [employer] or Organizations: No    Attends Banker Meetings: Not on file    Marital Status: Divorced  Intimate Partner Violence: Unknown (06/23/2021)   Received from [employer]   HITS    Physically Hurt: Not on file    Insult or Talk Down To: Not on file    Threaten Physical Harm: Not on file    Scream or Curse: Not on file    Family History  Problem Relation Age of Onset   Depression Mother    Alcohol abuse Mother    Hypertension Father    Alcohol abuse Sister    Diabetes Sister    Hypertension Sister    Diabetes Brother    Hypertension Brother     Current Outpatient Medications on File Prior to Visit  Medication Sig Dispense Refill   Accu-Chek FastClix Lancets MISC Check blood sugar once daily.     amLODipine  (NORVASC ) 10 MG tablet TAKE 1 TABLET EVERY MORNING 90 tablet 1   ARIPiprazole  (ABILIFY ) 5 MG tablet Take 1 tablet (5 mg total) by mouth daily. 30 tablet 1   atorvastatin  (LIPITOR) 20 MG tablet Take 1 tablet (20 mg total) by mouth daily. 90 tablet 1   desvenlafaxine  (PRISTIQ ) 100 MG 24 hr tablet TAKE 1 TABLET DAILY 90 tablet 0   diclofenac  (VOLTAREN ) 75 MG EC tablet TAKE 1 TABLET TWICE A DAY 180 tablet 1   DULoxetine  (CYMBALTA ) 30 MG capsule Take 1 capsule (30 mg total) by mouth daily. 90 capsule 0   ferrous gluconate (FERGON) 324 MG tablet      glucose blood (ACCU-CHEK GUIDE) test strip Check blood sugar once daily.     losartan -hydrochlorothiazide  (HYZAAR) 100-25 MG tablet TAKE 1 TABLET DAILY 90 tablet 1   metFORMIN  (GLUCOPHAGE ) 500 MG tablet Take  1 tablet (500 mg total) by mouth 2 (two) times daily with a meal. 180 tablet 1   metoprolol  succinate (TOPROL -XL) 100 MG 24 hr tablet Take 1 tablet (100 mg total) by mouth daily. 90 tablet 1   Multiple Vitamin (MULTIVITAMIN IRON-FREE) TABS Take 1 tablet by mouth daily.     Multiple Vitamins-Minerals (HAIR SKIN AND NAILS FORMULA) TABS Take 1 tablet by mouth daily.      Omega-3 Fatty Acids (FISH OIL) 1000 MG CAPS Take by mouth.     omeprazole  (PRILOSEC) 20 MG  capsule Take 1 capsule (20 mg total) by mouth daily. 90 capsule 1   potassium chloride  SA (KLOR-CON  M) 20 MEQ tablet Take 1 tablet (20 mEq total) by mouth daily. 90 tablet 1   tirzepatide (MOUNJARO) 2.5 MG/0.5ML Pen Inject 2.5 mg into the skin once a week. 2 mL 3   traZODone  (DESYREL ) 100 MG tablet Take one to two at night 60 tablet 1   vitamin C (ASCORBIC ACID) 500 MG tablet Take 500 mg by mouth daily.     vitamin E 1000 UNIT capsule Take by mouth.     [DISCONTINUED] prazosin  (MINIPRESS ) 5 MG capsule Take 5 mg by mouth at bedtime.      No current facility-administered medications on file prior to visit.    Allergies  Allergen Reactions   Famotidine Nausea Only   Mirtazapine Other (See Comments)    Overly drowsy, couldn't function        Physical Exam Vitals requested from patient and listed below if patient had equipment and was able to obtain at home for this virtual visit: There were no vitals filed for this visit. Estimated body mass index is 28.62 kg/m as calculated from the following:   Height as of 06/11/23: 5\' 6"  (1.676 m).   Weight as of 07/13/23: 177 lb 4.8 oz (80.4 kg).  EKG (optional): deferred due to virtual visit  GENERAL: alert, oriented, no acute distress detected, full vision exam deferred due to pandemic and/or virtual encounter  PSYCH/NEURO: pleasant and cooperative, no obvious depression or anxiety, speech and thought processing grossly intact, Cognitive function grossly intact  Flowsheet Row Office Visit  from 07/13/2023 in [facility] [place]  PHQ-9 Total Score 9           08/24/2023    5:48 PM 07/13/2023   11:08 AM 04/13/2023   11:26 AM 12/15/2022    9:06 AM 05/21/2022    3:52 PM  Depression screen PHQ 2/9  Decreased Interest 0 0 2 3 3   Down, Depressed, Hopeless 1 2 2 3 3   PHQ - 2 Score 1 2 4 6 6   Altered sleeping  2  3 3   Tired, decreased energy  3 2 3 3   Change in appetite  1 3 3  0  Feeling bad or failure about yourself   0 2 3 3   Trouble concentrating  1 2 3 3   Moving slowly or fidgety/restless  0 0 3 3  Suicidal thoughts  0 0 3 0  PHQ-9 Score  9  27 21   Difficult doing work/chores   Very difficult Extremely dIfficult Not difficult at all  Has depression, sees psychiatry. Had financial stress - working again - but now is improving.      07/23/2018    9:24 PM 05/21/2022    3:52 PM 04/13/2023   11:26 AM 07/13/2023   11:08 AM 08/24/2023    5:47 PM  Fall Risk  Falls in the past year?  0 0 0 0  Was there an injury with Fall?  0 0 0 0  Fall Risk Category Calculator  0 0 0 0  (RETIRED) Patient Fall Risk Level Low fall risk      Fall risk Follow up   Falls evaluation completed Falls evaluation completed Falls evaluation completed;Education provided     SUMMARY AND PLAN:  Encounter for Medicare annual wellness exam  Screening for lung cancer - Plan: Ambulatory Referral Lung Cancer Screening [facility]  Encounter for screening mammogram for malignant neoplasm  of breast   Discussed applicable health maintenance/preventive health measures and advised and referred or ordered per patient preferences: -she agrees to schedule eye exam -discussed mammogram and lung cancer screening and placed orders for lung cancer screening, mammogram orders already in and she agrees to call to schedule -discussed vaccines - recs and risks and she knows can get at the pharmacy  Health Maintenance  Topic Date Due   OPHTHALMOLOGY EXAM  Never done   Hepatitis C  Screening  Never done   Pneumococcal Vaccine 23-70 Years old (2 of 2 - PCV) 10/12/2017   COVID-19 Vaccine (6 - 2024-25 season) 01/29/2023   MAMMOGRAM  06/27/2023   Lung Cancer Screening  09/07/2023   INFLUENZA VACCINE  10/22/2023   HEMOGLOBIN A1C  01/12/2024   Diabetic kidney evaluation - eGFR measurement  06/10/2024   Diabetic kidney evaluation - Urine ACR  06/10/2024   FOOT EXAM  07/12/2024   Medicare Annual Wellness (AWV)  08/23/2024   DTaP/Tdap/Td (3 - Td or Tdap) 09/11/2028   Colonoscopy  05/26/2032   HIV Screening  Completed   Zoster Vaccines- Shingrix  Completed   HPV VACCINES  Aged Out   Meningococcal B Vaccine  Aged Out      Education and counseling on the following was provided based on the above review of health and a plan/checklist for the patient, along with additional information discussed, was provided for the patient in the patient instructions :  -Advised on importance of completing advanced directives, discussed options for completing and provided information in patient instructions as well -Advised and counseled on a healthy lifestyle - including the importance of a healthy diet, regular physical activity, social connections and stress management. -Reviewed patient's current diet. Advised and counseled on a whole foods based healthy diet. A summary of a healthy diet was provided in the Patient Instructions.  -reviewed patient's current physical activity level and discussed exercise guidelines for adults. Discussed community resources and ideas for safe exercise at home to assist in meeting exercise guideline recommendations in a safe and healthy way.  -Advise yearly dental visits at minimum and regular eye exams -Advised and counseled on tobacco, advised to quit, discussed nicotine replacement options, quit line, barriers to quitting and plans for relapse  Follow up: see patient instructions     Patient Instructions  I really enjoyed getting to talk with you  today! I am available on Tuesdays and Thursdays for virtual visits if you have any questions or concerns, or if I can be of any further assistance.   CHECKLIST FROM ANNUAL WELLNESS VISIT:  -Follow up (please call to schedule if not scheduled after visit):   -yearly for annual wellness visit with primary care office  Here is a list of your preventive care/health maintenance measures and the plan for each if any are due:  PLAN For any measures below that may be due:    1. Call [facility] to schedule your mammogram : [phone]   2. I place a referral to [place] [facility] for lung cancer screening. Please let us  know if they do not call you in the next 1-2 weeks.   3. Please call to schedule your eye exam  Health Maintenance  Topic Date Due   OPHTHALMOLOGY EXAM  Never done   Hepatitis C Screening  Never done   Pneumococcal Vaccine 29-74 Years old (2 of 2 - PCV) 10/12/2017   COVID-19 Vaccine (6 - 2024-25 season) 01/29/2023   MAMMOGRAM  06/27/2023   Lung Cancer  Screening  09/07/2023   INFLUENZA VACCINE  10/22/2023   HEMOGLOBIN A1C  01/12/2024   Diabetic kidney evaluation - eGFR measurement  06/10/2024   Diabetic kidney evaluation - Urine ACR  06/10/2024   FOOT EXAM  07/12/2024   Medicare Annual Wellness (AWV)  08/23/2024   DTaP/Tdap/Td (3 - Td or Tdap) 09/11/2028   Colonoscopy  05/26/2032   HIV Screening  Completed   Zoster Vaccines- Shingrix  Completed   HPV VACCINES  Aged Out   Meningococcal B Vaccine  Aged Out    -See a dentist at least yearly  -Get your eyes checked and then per your eye specialist's recommendations  -Other issues addressed today:   Tobacco: please quit smoking. Let us  know if we can help. See more info below.   If you wish to quit smoking, help is available.  For information on [employer], plus links to online education, apps and resources, visit MobileKicks.be.   -I have included below further information regarding a  healthy whole foods based diet, physical activity guidelines for adults, stress management and opportunities for social connections. I hope you find this information useful.   -----------------------------------------------------------------------------------------------------------------------------------------------------------------------------------------------------------------------------------------------------------    NUTRITION: -eat real food: lots of colorful vegetables (half the plate) and fruits -5-7 servings of vegetables and fruits per day (fresh or steamed is best), exp. 2 servings of vegetables with lunch and dinner and 2 servings of fruit per day. Berries and greens such as kale and collards are great choices.  -consume on a regular basis:  fresh fruits, fresh veggies, fish, nuts, seeds, healthy oils (such as olive oil, avocado oil), whole grains (make sure for bread/pasta/crackers/etc., that the first ingredient on label contains the word "whole"), legumes. -can eat small amounts of dairy and lean meat (no larger than the palm of your hand), but avoid processed meats such as ham, bacon, lunch meat, etc. -drink water -try to avoid fast food and pre-packaged foods, processed meat, ultra processed foods/beverages (donuts, candy, etc.) -most experts advise limiting sodium to < 2300mg  per day, should limit further is any chronic conditions such as high blood pressure, heart disease, diabetes, etc. The American Heart Association advised that < 1500mg  is is ideal -try to avoid foods/beverages that contain any ingredients with names you do not recognize  -try to avoid foods/beverages  with added sugar or sweeteners/sweets  -try to avoid sweet drinks (including diet drinks): soda, juice, Gatorade, sweet tea, power drinks, diet drinks -try to avoid white rice, white bread, pasta (unless whole grain)  EXERCISE GUIDELINES FOR ADULTS: -if you wish to increase your physical activity, do so  gradually and with the approval of your doctor -STOP and seek medical care immediately if you have any chest pain, chest discomfort or trouble breathing when starting or increasing exercise  -move and stretch your body, legs, feet and arms when sitting for long periods -Physical activity guidelines for optimal health in adults: -get at least 150 minutes per week of moderate exercise (can talk, but not sing); this is about 20-30 minutes of sustained activity 5-7 days per week or two 10-15 minute episodes of sustained activity 5-7 days per week -do some muscle building/resistance training/strength training at least 2 days per week  -balance exercises 3+ days per week:   Stand somewhere where you have something sturdy to hold onto if you lose balance    1) lift up on toes, then back down, start with 5x per day and work up to 20x   2) stand  and lift one leg straight out to the side so that foot is a few inches of the floor, start with 5x each side and work up to 20x each side   3) stand on one foot, start with 5 seconds each side and work up to 20 seconds on each side  If you need ideas or help with getting more active:  -Silver sneakers https://tools.silversneakers.com  -Walk with a Doc: http://www.duncan-williams.com/  -try to include resistance (weight lifting/strength building) and balance exercises twice per week: or the following link for ideas: http://castillo-powell.com/  BuyDucts.dk  STRESS MANAGEMENT: -can try meditating, or just sitting quietly with deep breathing while intentionally relaxing all parts of your body for 5 minutes daily -if you need further help with stress, anxiety or depression please follow up with your primary doctor or contact the wonderful folks at [facility]: [phone]  SOCIAL CONNECTIONS: -options in [place] if you wish to engage in more social and  exercise related activities:  -Silver sneakers https://tools.silversneakers.com  -Walk with a Doc: http://www.duncan-williams.com/  -Check out the [facility] Active Adults 50+ section on the [employer] of [employer] (hiking clubs, book clubs, cards and games, chess, exercise classes, aquatic classes and much more) - see the website for details: https://www.[place].gov/departments/parks-recreation/active-adults50  -YouTube has lots of exercise videos for different ages and abilities as well  -[facility] (a variety of indoor and outdoor inperson activities for adults). [phone]. [address]  -Virtual Online Classes (a variety of topics): see seniorplanet.org or call [phone]  -consider volunteering at a school, hospice center, church, senior center or elsewhere    ADVANCED HEALTHCARE DIRECTIVES:  [place] Advanced Directives assistance:   ExpressWeek.com.cy  Everyone should have advanced health care directives in place. This is so that you get the care you want, should you ever be in a situation where you are unable to make your own medical decisions.   From the [place] Advanced Directive Website: "Advance Health Care Directives are legal documents in which you give written instructions about your health care if, in the future, you cannot speak for yourself.   A health care power of attorney allows you to name a person you trust to make your health care decisions if you cannot make them yourself. A declaration of a desire for a natural death (or living will) is document, which states that you desire not to have your life prolonged by extraordinary measures if you have a terminal or incurable illness or if you are in a vegetative state. An advance instruction for mental health treatment makes a declaration of instructions, information and preferences regarding your mental health treatment. It also states  that you are aware that the advance instruction authorizes a mental health treatment provider to act according to your wishes. It may also outline your consent or refusal of mental health treatment. A declaration of an anatomical gift allows anyone over the age of 57 to make a gift by will, organ donor card or other document."   Please see the following website or an elder law attorney for forms, FAQs and for completion of advanced directives: [place]  Print production planner Health Care Directives Advance Health Care Directives (http://guzman.[email or web address]/)  Or copy and paste the following to your web browser: [email or web address]          Maurie Southern, DO

## 2023-08-24 NOTE — Patient Instructions (Addendum)
 I really enjoyed getting to talk with you today! I am available on Tuesdays and Thursdays for virtual visits if you have any questions or concerns, or if I can be of any further assistance.   CHECKLIST FROM ANNUAL WELLNESS VISIT:  -Follow up (please call to schedule if not scheduled after visit):   -yearly for annual wellness visit with primary care office  Here is a list of your preventive care/health maintenance measures and the plan for each if any are due:  PLAN For any measures below that may be due:    1. Call [facility] to schedule your mammogram : [phone]   2. I place a referral to [place] [facility] for lung cancer screening. Please let us  know if they do not call you in the next 1-2 weeks.   3. Please call to schedule your eye exam  Health Maintenance  Topic Date Due   OPHTHALMOLOGY EXAM  Never done   Hepatitis C Screening  Never done   Pneumococcal Vaccine 56-29 Years old (2 of 2 - PCV) 10/12/2017   COVID-19 Vaccine (6 - 2024-25 season) 01/29/2023   MAMMOGRAM  06/27/2023   Lung Cancer Screening  09/07/2023   INFLUENZA VACCINE  10/22/2023   HEMOGLOBIN A1C  01/12/2024   Diabetic kidney evaluation - eGFR measurement  06/10/2024   Diabetic kidney evaluation - Urine ACR  06/10/2024   FOOT EXAM  07/12/2024   Medicare Annual Wellness (AWV)  08/23/2024   DTaP/Tdap/Td (3 - Td or Tdap) 09/11/2028   Colonoscopy  05/26/2032   HIV Screening  Completed   Zoster Vaccines- Shingrix  Completed   HPV VACCINES  Aged Out   Meningococcal B Vaccine  Aged Out    -See a dentist at least yearly  -Get your eyes checked and then per your eye specialist's recommendations  -Other issues addressed today:   Tobacco: please quit smoking. Let us  know if we can help. See more info below.   If you wish to quit smoking, help is available.  For information on [employer], plus links to online education, apps and resources, visit MobileKicks.be.   -I have  included below further information regarding a healthy whole foods based diet, physical activity guidelines for adults, stress management and opportunities for social connections. I hope you find this information useful.   -----------------------------------------------------------------------------------------------------------------------------------------------------------------------------------------------------------------------------------------------------------    NUTRITION: -eat real food: lots of colorful vegetables (half the plate) and fruits -5-7 servings of vegetables and fruits per day (fresh or steamed is best), exp. 2 servings of vegetables with lunch and dinner and 2 servings of fruit per day. Berries and greens such as kale and collards are great choices.  -consume on a regular basis:  fresh fruits, fresh veggies, fish, nuts, seeds, healthy oils (such as olive oil, avocado oil), whole grains (make sure for bread/pasta/crackers/etc., that the first ingredient on label contains the word "whole"), legumes. -can eat small amounts of dairy and lean meat (no larger than the palm of your hand), but avoid processed meats such as ham, bacon, lunch meat, etc. -drink water -try to avoid fast food and pre-packaged foods, processed meat, ultra processed foods/beverages (donuts, candy, etc.) -most experts advise limiting sodium to < 2300mg  per day, should limit further is any chronic conditions such as high blood pressure, heart disease, diabetes, etc. The American Heart Association advised that < 1500mg  is is ideal -try to avoid foods/beverages that contain any ingredients with names you do not recognize  -try to avoid foods/beverages  with added  sugar or sweeteners/sweets  -try to avoid sweet drinks (including diet drinks): soda, juice, Gatorade, sweet tea, power drinks, diet drinks -try to avoid white rice, white bread, pasta (unless whole grain)  EXERCISE GUIDELINES FOR ADULTS: -if you  wish to increase your physical activity, do so gradually and with the approval of your doctor -STOP and seek medical care immediately if you have any chest pain, chest discomfort or trouble breathing when starting or increasing exercise  -move and stretch your body, legs, feet and arms when sitting for long periods -Physical activity guidelines for optimal health in adults: -get at least 150 minutes per week of moderate exercise (can talk, but not sing); this is about 20-30 minutes of sustained activity 5-7 days per week or two 10-15 minute episodes of sustained activity 5-7 days per week -do some muscle building/resistance training/strength training at least 2 days per week  -balance exercises 3+ days per week:   Stand somewhere where you have something sturdy to hold onto if you lose balance    1) lift up on toes, then back down, start with 5x per day and work up to 20x   2) stand and lift one leg straight out to the side so that foot is a few inches of the floor, start with 5x each side and work up to 20x each side   3) stand on one foot, start with 5 seconds each side and work up to 20 seconds on each side  If you need ideas or help with getting more active:  -Silver sneakers https://tools.silversneakers.com  -Walk with a Doc: http://www.duncan-williams.com/  -try to include resistance (weight lifting/strength building) and balance exercises twice per week: or the following link for ideas: http://castillo-powell.com/  BuyDucts.dk  STRESS MANAGEMENT: -can try meditating, or just sitting quietly with deep breathing while intentionally relaxing all parts of your body for 5 minutes daily -if you need further help with stress, anxiety or depression please follow up with your primary doctor or contact the wonderful folks at [facility]: [phone]  SOCIAL CONNECTIONS: -options in [place]  if you wish to engage in more social and exercise related activities:  -Silver sneakers https://tools.silversneakers.com  -Walk with a Doc: http://www.duncan-williams.com/  -Check out the [facility] Active Adults 50+ section on the [place] of [employer] (hiking clubs, book clubs, cards and games, chess, exercise classes, aquatic classes and much more) - see the website for details: https://www.[place].gov/departments/parks-recreation/active-adults50  -YouTube has lots of exercise videos for different ages and abilities as well  -[facility] (a variety of indoor and outdoor inperson activities for adults). [phone]. [address].  -Virtual Online Classes (a variety of topics): see seniorplanet.org or call [phone]  -consider volunteering at a school, hospice center, church, senior center or elsewhere    ADVANCED HEALTHCARE DIRECTIVES:  [place] Advanced Directives assistance:   ExpressWeek.com.cy  Everyone should have advanced health care directives in place. This is so that you get the care you want, should you ever be in a situation where you are unable to make your own medical decisions.   From the [place] Advanced Directive Website: "Advance Health Care Directives are legal documents in which you give written instructions about your health care if, in the future, you cannot speak for yourself.   A health care power of attorney allows you to name a person you trust to make your health care decisions if you cannot make them yourself. A declaration of a desire for a natural death (or living will) is  document, which states that you desire not to have your life prolonged by extraordinary measures if you have a terminal or incurable illness or if you are in a vegetative state. An advance instruction for mental health treatment makes a declaration of instructions, information and preferences regarding your  mental health treatment. It also states that you are aware that the advance instruction authorizes a mental health treatment provider to act according to your wishes. It may also outline your consent or refusal of mental health treatment. A declaration of an anatomical gift allows anyone over the age of 71 to make a gift by will, organ donor card or other document."   Please see the following website or an elder law attorney for forms, FAQs and for completion of advanced directives: [place]  Print production planner Health Care Directives Advance Health Care Directives (http://guzman.[email or web address]/)  Or copy and paste the following to your web browser: PoshChat.fi

## 2023-09-13 ENCOUNTER — Other Ambulatory Visit: Payer: Self-pay | Admitting: *Deleted

## 2023-09-13 ENCOUNTER — Telehealth: Payer: Self-pay | Admitting: *Deleted

## 2023-09-13 DIAGNOSIS — F17210 Nicotine dependence, cigarettes, uncomplicated: Secondary | ICD-10-CM

## 2023-09-13 DIAGNOSIS — Z87891 Personal history of nicotine dependence: Secondary | ICD-10-CM

## 2023-09-13 DIAGNOSIS — Z122 Encounter for screening for malignant neoplasm of respiratory organs: Secondary | ICD-10-CM

## 2023-09-13 NOTE — Telephone Encounter (Signed)
 Lung Cancer Screening Narrative/Criteria Questionnaire (Cigarette Smokers Only- No Cigars/Pipes/vapes)   Sandra Bean [facility]   SDMV:09/23/23 9:30- Natalie                                           1960-08-01              LDCT: 09/28/23 5:00- DWB    63 y.o.   Phone: [phone]  Lung Screening Narrative (confirm age 89-77 yrs Medicare / 50-80 yrs Private pay insurance)   Insurance information:Aetna MCR   Referring Provider:Kim   This screening involves an initial phone call with a team member from our program. It is called a shared decision making visit. The initial meeting is required by insurance and Medicare to make sure you understand the program. This appointment takes about 15-20 minutes to complete. The CT scan will completed at a separate date/time. This scan takes about 5-10 minutes to complete and you may eat and drink before and after the scan.  Criteria questions for Lung Cancer Screening:   Are you a current or former smoker? Current Age began smoking: 15   If you are a former smoker, what year did you quit smoking? within 15 yrs)   To calculate your smoking history, I need an accurate estimate of how many packs of cigarettes you smoked per day and for how many years. (Not just the number of PPD you are now smoking)   Years smoking 47 x Packs per day 1/2 - 1 = Pack years 37   (at least 20 pack yrs)   (Make sure they understand that we need to know how much they have smoked in the past, not just the number of PPD they are smoking now)  Do you have a personal history of cancer?  No    Do you have a family history of cancer? No  Are you coughing up blood?  No  Have you had unexplained weight loss of 15 lbs or more in the last 6 months? No  It looks like you meet all criteria.     Additional information: N/A

## 2023-09-17 ENCOUNTER — Encounter (HOSPITAL_BASED_OUTPATIENT_CLINIC_OR_DEPARTMENT_OTHER): Payer: Self-pay

## 2023-09-17 ENCOUNTER — Encounter (HOSPITAL_BASED_OUTPATIENT_CLINIC_OR_DEPARTMENT_OTHER): Admitting: Radiology

## 2023-09-17 DIAGNOSIS — Z1231 Encounter for screening mammogram for malignant neoplasm of breast: Secondary | ICD-10-CM

## 2023-09-20 ENCOUNTER — Encounter (HOSPITAL_BASED_OUTPATIENT_CLINIC_OR_DEPARTMENT_OTHER): Admitting: Radiology

## 2023-09-20 DIAGNOSIS — Z1231 Encounter for screening mammogram for malignant neoplasm of breast: Secondary | ICD-10-CM

## 2023-09-23 ENCOUNTER — Ambulatory Visit: Admitting: Acute Care

## 2023-09-23 DIAGNOSIS — F17210 Nicotine dependence, cigarettes, uncomplicated: Secondary | ICD-10-CM | POA: Diagnosis not present

## 2023-09-23 NOTE — Progress Notes (Signed)
 Virtual Visit via Telephone Note  I connected with [name] on 09/23/23 at  9:30 AM EDT by telephone and verified that I am speaking with the correct person using two identifiers.  Location: Patient: Sandra Bean Provider: Laneta Speaks, RN   I discussed the limitations, risks, security and privacy concerns of performing an evaluation and management service by telephone and the availability of in person appointments. I also discussed with the patient that there may be a patient responsible charge related to this service. The patient expressed understanding and agreed to proceed.   Shared Decision Making Visit Lung Cancer Screening Program [phone])   Eligibility: Age 63 y.o. Pack Years Smoking History Calculation 35 (# packs/per year x # years smoked) Recent History of coughing up blood  no Unexplained weight loss? no ( >Than 15 pounds within the last 6 months ) Prior History Lung / other cancer no (Diagnosis within the last 5 years already requiring surveillance chest CT Scans). Smoking Status Current Smoker Former Smokers: Years since quit: n/a  Quit Date: n/a  Visit Components: Discussion included one or more decision making aids. yes Discussion included risk/benefits of screening. yes Discussion included potential follow up diagnostic testing for abnormal scans. yes Discussion included meaning and risk of over diagnosis. yes Discussion included meaning and risk of False Positives. yes Discussion included meaning of total radiation exposure. yes  Counseling Included: Importance of adherence to annual lung cancer LDCT screening. yes Impact of comorbidities on ability to participate in the program. yes Ability and willingness to under diagnostic treatment. yes  Smoking Cessation Counseling: Current Smokers:  Discussed importance of smoking cessation. yes Information about tobacco cessation classes and interventions provided to patient. yes Patient provided with  ticket for LDCT Scan. no Symptomatic Patient. no  Counseling(Intermediate counseling: > three minutes) 99406 Diagnosis Code: Tobacco Use Z72.0 Asymptomatic Patient yes  Counseling (Intermediate counseling: > three minutes counseling) H9563 Former Smokers:  Discussed the importance of maintaining cigarette abstinence. yes Diagnosis Code: Personal History of Nicotine Dependence. S12.108 Information about tobacco cessation classes and interventions provided to patient. Yes Patient provided with ticket for LDCT Scan. no Written Order for Lung Cancer Screening with LDCT placed in Epic. Yes (CT Chest Lung Cancer Screening Low Dose W/O CM) [id number] Z12.2-Screening of respiratory organs Z87.891-Personal history of nicotine dependence   Laneta Speaks, RN

## 2023-09-23 NOTE — Patient Instructions (Signed)

## 2023-09-28 ENCOUNTER — Ambulatory Visit (HOSPITAL_BASED_OUTPATIENT_CLINIC_OR_DEPARTMENT_OTHER)
Admission: RE | Admit: 2023-09-28 | Discharge: 2023-09-28 | Disposition: A | Discharge status: Home / Self Care | Source: Ambulatory Visit | Attending: Acute Care | Admitting: Acute Care

## 2023-09-28 DIAGNOSIS — F17210 Nicotine dependence, cigarettes, uncomplicated: Secondary | ICD-10-CM | POA: Insufficient documentation

## 2023-09-28 DIAGNOSIS — Z87891 Personal history of nicotine dependence: Secondary | ICD-10-CM | POA: Insufficient documentation

## 2023-09-28 DIAGNOSIS — Z122 Encounter for screening for malignant neoplasm of respiratory organs: Secondary | ICD-10-CM | POA: Insufficient documentation

## 2023-10-03 ENCOUNTER — Ambulatory Visit (HOSPITAL_BASED_OUTPATIENT_CLINIC_OR_DEPARTMENT_OTHER)
Admission: RE | Admit: 2023-10-03 | Discharge: 2023-10-03 | Disposition: A | Discharge status: Home / Self Care | Source: Ambulatory Visit | Attending: Family Medicine | Admitting: Family Medicine

## 2023-10-03 DIAGNOSIS — Z1231 Encounter for screening mammogram for malignant neoplasm of breast: Secondary | ICD-10-CM | POA: Diagnosis present

## 2023-10-04 ENCOUNTER — Other Ambulatory Visit: Payer: Self-pay

## 2023-10-04 DIAGNOSIS — F17210 Nicotine dependence, cigarettes, uncomplicated: Secondary | ICD-10-CM

## 2023-10-04 DIAGNOSIS — Z87891 Personal history of nicotine dependence: Secondary | ICD-10-CM

## 2023-10-04 DIAGNOSIS — Z122 Encounter for screening for malignant neoplasm of respiratory organs: Secondary | ICD-10-CM

## 2023-10-06 ENCOUNTER — Ambulatory Visit: Admitting: Podiatry

## 2023-10-06 DIAGNOSIS — Q666 Other congenital valgus deformities of feet: Secondary | ICD-10-CM | POA: Diagnosis not present

## 2023-10-06 DIAGNOSIS — M722 Plantar fascial fibromatosis: Secondary | ICD-10-CM

## 2023-10-06 NOTE — Progress Notes (Signed)
 Subjective:  Patient ID: Sandra Bean, female    DOB: 1960/12/23,  MRN: [id number]  Chief Complaint  Patient presents with   Foot Pain    Pt stated that she has been having some swelling with her right foot she wanted to have it checked out     63 y.o. female presents with the above complaint.  Patient presents with complaint of right plantar fasciitis pain she states that it started get little achy again.  She states she was doing good but what is getting swollen and painful she wanted have another steroid injection   Review of Systems: Negative except as noted in the HPI. Denies N/V/F/Ch.  Past Medical History:  Diagnosis Date   Anemia    Anxiety    PTDS   Arthritis    RIGHT SIDE   COPD (chronic obstructive pulmonary disease) (HCC)    Depression    Diabetes mellitus without complication (HCC)    Type 2   Fibroids    Fibromyalgia    DAILY PAIN   GERD (gastroesophageal reflux disease)    H/O hernia repair 2009   Headache    Hypertension    Hyperthyroidism    NON PER PATIENT   Mental disorder    Mitral valve prolapse    Ovarian cyst     Current Outpatient Medications:    Accu-Chek FastClix Lancets MISC, Check blood sugar once daily., Disp: , Rfl:    amLODipine  (NORVASC ) 10 MG tablet, TAKE 1 TABLET EVERY MORNING, Disp: 90 tablet, Rfl: 1   ARIPiprazole  (ABILIFY ) 5 MG tablet, Take 1 tablet (5 mg total) by mouth daily., Disp: 30 tablet, Rfl: 1   atorvastatin  (LIPITOR) 20 MG tablet, Take 1 tablet (20 mg total) by mouth daily., Disp: 90 tablet, Rfl: 1   desvenlafaxine  (PRISTIQ ) 100 MG 24 hr tablet, TAKE 1 TABLET DAILY, Disp: 90 tablet, Rfl: 0   diclofenac  (VOLTAREN ) 75 MG EC tablet, TAKE 1 TABLET TWICE A DAY, Disp: 180 tablet, Rfl: 1   DULoxetine  (CYMBALTA ) 30 MG capsule, Take 1 capsule (30 mg total) by mouth daily., Disp: 90 capsule, Rfl: 0   ferrous gluconate (FERGON) 324 MG tablet, , Disp: , Rfl:    glucose blood (ACCU-CHEK GUIDE) test strip, Check blood sugar once  daily., Disp: , Rfl:    losartan -hydrochlorothiazide  (HYZAAR) 100-25 MG tablet, TAKE 1 TABLET DAILY, Disp: 90 tablet, Rfl: 1   metFORMIN  (GLUCOPHAGE ) 500 MG tablet, Take 1 tablet (500 mg total) by mouth 2 (two) times daily with a meal., Disp: 180 tablet, Rfl: 1   metoprolol  succinate (TOPROL -XL) 100 MG 24 hr tablet, Take 1 tablet (100 mg total) by mouth daily., Disp: 90 tablet, Rfl: 1   Multiple Vitamin (MULTIVITAMIN IRON-FREE) TABS, Take 1 tablet by mouth daily., Disp: , Rfl:    Multiple Vitamins-Minerals (HAIR SKIN AND NAILS FORMULA) TABS, Take 1 tablet by mouth daily. , Disp: , Rfl:    Omega-3 Fatty Acids (FISH OIL) 1000 MG CAPS, Take by mouth., Disp: , Rfl:    omeprazole  (PRILOSEC) 20 MG capsule, Take 1 capsule (20 mg total) by mouth daily., Disp: 90 capsule, Rfl: 1   potassium chloride  SA (KLOR-CON  M) 20 MEQ tablet, Take 1 tablet (20 mEq total) by mouth daily., Disp: 90 tablet, Rfl: 1   tirzepatide  (MOUNJARO ) 2.5 MG/0.5ML Pen, Inject 2.5 mg into the skin once a week., Disp: 2 mL, Rfl: 3   traZODone  (DESYREL ) 100 MG tablet, Take one to two at night, Disp: 60 tablet, Rfl: 1  vitamin C (ASCORBIC ACID) 500 MG tablet, Take 500 mg by mouth daily., Disp: , Rfl:    vitamin E 1000 UNIT capsule, Take by mouth., Disp: , Rfl:   Social History   Tobacco Use  Smoking Status Every Day   Current packs/day: 0.75   Average packs/day: 0.8 packs/day for 46.6 years (34.9 ttl pk-yrs)   Types: Cigarettes   Start date: 1979  Smokeless Tobacco Never    Allergies  Allergen Reactions   Famotidine Nausea Only   Mirtazapine Other (See Comments)    Overly drowsy, couldn't function    Objective:  There were no vitals filed for this visit. There is no height or weight on file to calculate BMI. Constitutional Well developed. Well nourished.  Vascular Dorsalis pedis pulses palpable bilaterally. Posterior tibial pulses palpable bilaterally. Capillary refill normal to all digits.  No cyanosis or clubbing  noted. Pedal hair growth normal.  Neurologic Normal speech. Oriented to person, place, and time. Epicritic sensation to light touch grossly present bilaterally.  Dermatologic Nails well groomed and normal in appearance. No open wounds. No skin lesions.  Orthopedic:   Pain on palpation to the medial calcaneal tuber right.  No pain with lateral calcaneal squeeze test.  Positive Silfverskiold gastrocnemius recession noted   Radiographs: 3 views of skeletally mature the right foot: No fractures noted.  Mild midfoot arthritis noted.  No other bony abnormalities noted. Assessment:   1. Plantar fasciitis of right foot   2. Pes planovalgus     Plan:  Patient was evaluated and treated and all questions answered.    Plantar Fasciitis, right with underlying mild pes planovalgus - XR reviewed as above.  - Educated on icing and stretching. Instructions given.  - Injection delivered to the plantar fascia as below. - DME: None - Pharmacologic management: None  Procedure: Injection Tendon/Ligament Location: Right plantar fascia at the glabrous junction; medial approach. Skin Prep: alcohol Injectate: 0.5 cc 0.5% marcaine  plain, 0.5 cc of 1% Lidocaine , 0.5 cc kenalog  10. Disposition: Patient tolerated procedure well. Injection site dressed with a band-aid.  No follow-ups on file.   No follow-ups on file.

## 2023-10-08 ENCOUNTER — Other Ambulatory Visit: Payer: Self-pay | Admitting: Family Medicine

## 2023-10-08 DIAGNOSIS — R928 Other abnormal and inconclusive findings on diagnostic imaging of breast: Secondary | ICD-10-CM

## 2023-10-11 ENCOUNTER — Ambulatory Visit: Payer: Self-pay | Admitting: Family Medicine

## 2023-10-12 ENCOUNTER — Ambulatory Visit: Admitting: Family Medicine

## 2023-10-14 ENCOUNTER — Ambulatory Visit: Admitting: Orthopedic Surgery

## 2023-10-14 ENCOUNTER — Other Ambulatory Visit (INDEPENDENT_AMBULATORY_CARE_PROVIDER_SITE_OTHER): Payer: Self-pay

## 2023-10-14 VITALS — BP 144/75 | HR 68 | Ht 66.0 in | Wt 166.4 lb

## 2023-10-14 DIAGNOSIS — M7062 Trochanteric bursitis, left hip: Secondary | ICD-10-CM | POA: Diagnosis not present

## 2023-10-14 DIAGNOSIS — M545 Low back pain, unspecified: Secondary | ICD-10-CM

## 2023-10-14 DIAGNOSIS — Z720 Tobacco use: Secondary | ICD-10-CM | POA: Diagnosis not present

## 2023-10-14 NOTE — Progress Notes (Signed)
 Orthopedic Spine Surgery Office Note  Assessment: Patient is a 63 y.o. female with two issues:  Left lateral hip pain consistent with trochanteric bursitis Low back pain with radiation to bilateral buttocks.  Majority of pain is in the back and has facet arthropathy at L4/5 and L5/S1   Plan: -Patient has tried multiple topical agents, tylenol , aleve , ESI -Recommended PT for both her back and her trochanteric bursitis -Performed a left trochanteric bursa injection (see procedure note below) -Recommended diagnostic/therapeutic facet injections for her low back pain -Patient will need to be nicotine free prior to any elective spine surgery -Patient should return to office in 6 weeks, x-rays at next visit: none   I spent covering the risks associated with smoking. I discussed, from a surgeon's perspective, some of the risks particularly infection, wound healing issues, nonunion. Encouraged cessation of all nicotine containing products including smoking. Patient is working on quitting. She is slowly decreasing the amount of cigarettes she smokes.   Left hip trochanteric bursa injection note: After discussing the risk, benefits, alternatives of left hip trochanteric bursa injection, patient elected to proceed.  Patient was in the lateral position with the left hip up.  The skin over the trochanteric bursa was prepped with an alcohol-based prep.  Ethyl chloride was used to anesthetize the skin.  A 20-gauge needle was used to inject 1 cc of lidocaine , 1 cc of bupivacaine , 1 cc of Depo-Medrol  into the trochanteric bursa under standard sterile technique.  Needle was withdrawn and Band-Aid was applied.  Patient tolerated the procedure well.  ___________________________________________________________________________   History:  Patient is a 63 y.o. female who presents today for lumbar spine.  Patient has had pain in her low back for about 9 months now.  Initially, she had pain radiating down the  right lower extremity along the posterolateral aspect of the thigh and leg.  That is since resolved.  She still gets periodic pain radiating into either lower extremity.  Right now, the left is more consistently affected.  She also has pain that radiates into the bilateral buttocks.  Her leg pain is not felt daily or all the time.  She has not noticed any particular that brings on the leg pain.  She does feel the back pain consistently.  She also has pain in the area of the paraspinal muscles of the lower lumbar spine.  Pain has been getting progressively worse with time.  She has tried multiple treatments with other providers but has not noticed any relief with those.  She is also notes for the last couple of months pain over the lateral aspect of the left hip.  She has difficulty sleeping on that side as a result of the pain.  She notes the pain is worse if she is more active.   Weakness: Denies Symptoms of imbalance: Denies Paresthesias and numbness: Denies Bowel or bladder incontinence: Denies Saddle anesthesia: Denies  Treatments tried: multiple topical agents, tylenol , aleve , ESI  Review of systems: Denies fevers and chills, night sweats, unexplained weight loss, history of cancer.  Has had pain that wakes her at night  Past medical history: Depression/anxiety COPD HTN GERD Fibromyalgia DM  Allergies: famotidine, mirtazapine  Past surgical history:  Hysterectomy Salpingo-oophorectomy Hernia repair  Social history: Reports use of nicotine product (smoking, vaping, patches, smokeless) Alcohol use: denies Denies recreational drug use   Physical Exam:  BMI of 26.9  General: no acute distress, appears stated age Neurologic: alert, answering questions appropriately, following commands Respiratory: unlabored breathing  on room air, symmetric chest rise Psychiatric: appropriate affect, normal cadence to speech   MSK (spine):  -Strength exam      Left  Right EHL     5/5  5/5 TA    5/5  5/5 GSC    5/5  5/5 Knee extension  5/5  5/5 Hip flexion   5/5  5/5  -Sensory exam    Sensation intact to light touch in L3-S1 nerve distributions of bilateral lower extremities  -Achilles DTR: 2/4 on the left, 2/4 on the right -Patellar tendon DTR: 2/4 on the left, 2/4 on the right  -Straight leg raise: Negative bilaterally -Clonus: no beats bilaterally  -Left hip exam: TTP over the trochanteric bursa, no other tenderness palpation over the remainder of the hip, negative Stinchfield, negative FABER, no pain through range of motion -Right hip exam: No pain through range of motion  Imaging: XRs of the lumbar spine from 10/14/2023 were independently reviewed and interpreted, showing grade 1 spondylolisthesis at L4/5.  Disc height loss at L4/5 and L5/S1.  No other significant degenerative changes.  No fracture or dislocation seen.  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 Central and lateral recess stenosis at L4/5.  Bilateral foraminal stenosis at L5/S1 (R>L).   Patient name: Sandra Bean Patient MRN: [id number] Date of visit: 10/14/23

## 2023-10-18 ENCOUNTER — Ambulatory Visit
Admission: RE | Admit: 2023-10-18 | Discharge: 2023-10-18 | Disposition: A | Discharge status: Home / Self Care | Source: Ambulatory Visit | Attending: Family Medicine | Admitting: Family Medicine

## 2023-10-18 ENCOUNTER — Ambulatory Visit
Admission: RE | Admit: 2023-10-18 | Discharge: 2023-10-18 | Disposition: A | Discharge status: Home / Self Care | Source: Ambulatory Visit | Attending: Family Medicine

## 2023-10-18 ENCOUNTER — Other Ambulatory Visit: Payer: Self-pay | Admitting: Family Medicine

## 2023-10-18 DIAGNOSIS — R928 Other abnormal and inconclusive findings on diagnostic imaging of breast: Secondary | ICD-10-CM

## 2023-10-19 ENCOUNTER — Other Ambulatory Visit: Payer: Self-pay | Admitting: Family Medicine

## 2023-10-19 DIAGNOSIS — N6310 Unspecified lump in the right breast, unspecified quadrant: Secondary | ICD-10-CM

## 2023-10-22 ENCOUNTER — Ambulatory Visit
Admission: RE | Admit: 2023-10-22 | Discharge: 2023-10-22 | Disposition: A | Discharge status: Home / Self Care | Source: Ambulatory Visit | Attending: Family Medicine | Admitting: Family Medicine

## 2023-10-22 DIAGNOSIS — N6310 Unspecified lump in the right breast, unspecified quadrant: Secondary | ICD-10-CM

## 2023-10-22 HISTORY — PX: BREAST BIOPSY: SHX20

## 2023-10-25 LAB — SURGICAL PATHOLOGY

## 2023-10-26 ENCOUNTER — Other Ambulatory Visit (HOSPITAL_COMMUNITY): Payer: Self-pay | Admitting: Psychiatry

## 2023-11-02 ENCOUNTER — Ambulatory Visit

## 2023-11-05 ENCOUNTER — Telehealth (INDEPENDENT_AMBULATORY_CARE_PROVIDER_SITE_OTHER): Payer: Medicare HMO | Admitting: Psychiatry

## 2023-11-05 ENCOUNTER — Encounter (HOSPITAL_COMMUNITY): Payer: Self-pay | Admitting: Psychiatry

## 2023-11-05 DIAGNOSIS — F332 Major depressive disorder, recurrent severe without psychotic features: Secondary | ICD-10-CM | POA: Diagnosis not present

## 2023-11-05 DIAGNOSIS — F411 Generalized anxiety disorder: Secondary | ICD-10-CM

## 2023-11-05 DIAGNOSIS — F4310 Post-traumatic stress disorder, unspecified: Secondary | ICD-10-CM | POA: Diagnosis not present

## 2023-11-05 DIAGNOSIS — F5102 Adjustment insomnia: Secondary | ICD-10-CM

## 2023-11-05 MED ORDER — TRAZODONE HCL 100 MG PO TABS: ORAL_TABLET | ORAL | 1 refills | Status: DC

## 2023-11-05 MED ORDER — ARIPIPRAZOLE 5 MG PO TABS: 5.0000 mg | ORAL_TABLET | Freq: Every day | ORAL | 1 refills | Status: DC

## 2023-11-05 NOTE — Progress Notes (Signed)
 [facility] Follow up visit  Patient Identification: Sandra Bean MRN:  [id number] Date of Evaluation:  11/05/2023 Referral Source: primary care Chief Complaint:    Depression follow up  Visit Diagnosis:    ICD-10-CM   1. Severe episode of recurrent major depressive disorder, without psychotic features (HCC)  F33.2 ARIPiprazole  (ABILIFY ) 5 MG tablet    2. GAD (generalized anxiety disorder)  F41.1     3. Adjustment insomnia  F51.02     4. PTSD (post-traumatic stress disorder)  F43.10     Virtual Visit via Video Note  I connected with [name] on 11/05/23 at 10:30 AM EDT by a video enabled telemedicine application and verified that I am speaking with the correct person using two identifiers.  Location: Patient: home Provider: home office   I discussed the limitations of evaluation and management by telemedicine and the availability of in person appointments. The patient expressed understanding and agreed to proceed.    I discussed the assessment and treatment plan with the patient. The patient was provided an opportunity to ask questions and all were answered. The patient agreed with the plan and demonstrated an understanding of the instructions.   The patient was advised to call back or seek an in-person evaluation if the symptoms worsen or if the condition fails to improve as anticipated.  I provided 18 minutes of non-face-to-face time during this encounter.    History of Present Illness:  [name] is a 63  years old currently single African-American female initially referred by primary care physician for management of depression and PTSD.  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  Finances are challenging  Aggravating factors; multiple medical issues including diabetes.  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  Duration  20 plus years Severity; fair Past Psychiatric History: depression    Past Medical History:  Past Medical History:  Diagnosis Date   Anemia    Anxiety    PTDS   Arthritis    RIGHT SIDE   COPD (chronic obstructive pulmonary disease) (HCC)    Depression    Diabetes mellitus without complication (HCC)    Type 2   Fibroids    Fibromyalgia    DAILY PAIN   GERD (gastroesophageal reflux disease)    H/O hernia repair 2009   Headache    Hypertension    Hyperthyroidism    NON PER PATIENT   Mental disorder    Mitral valve prolapse    Ovarian cyst     Past Surgical History:  Procedure Laterality Date   ABDOMINAL HYSTERECTOMY     BREAST BIOPSY Right 10/22/2023   US  RT BREAST BX W LOC DEV 1ST LESION IMG BX SPEC US  GUIDE 10/22/2023 [facility]   BREAST SURGERY     FOOT SURGERY     HERNIA REPAIR     LAPAROTOMY Bilateral 01/19/2017   Procedure: MINI-LAPAROTOMY TO REMOVE UTERUS, BILATERAL FALLOPIAN TUBES AND BILATERAL OVARIES;  Surgeon: Corene Coy, MD;  Location: [facility];  Service: Gynecology;  Laterality: Bilateral;   NOSE SURGERY     ROBOTIC ASSISTED TOTAL HYSTERECTOMY WITH BILATERAL SALPINGO OOPHERECTOMY Bilateral 01/19/2017   Procedure: ROBOTIC ASSISTED TOTAL LAPAROSCOPIC SUPRACERVICAL HYSTERECTOMY WITH BILATERAL SALPINGO OOPHORECTOMY WITH MINI LAPAROTOMY TO REMOVE SPECIMEN;  Surgeon: Corene Coy, MD;  Location: [facility];  Service: Gynecology;  Laterality: Bilateral;   THERAPEUTIC ABORTION      Family Psychiatric History: Parents : alcoholic  Family History:  Family History  Problem Relation Age of Onset   Depression Mother     Alcohol abuse Mother    Hypertension Father    Alcohol abuse Sister    Diabetes Sister    Hypertension Sister    Diabetes Brother    Hypertension Brother     Social History:   Social History   Socioeconomic History   Marital status: Divorced    Spouse name: Not on file   Number of children: Not on file   Years of education: Not on file   Highest education level: Associate degree: academic program  Occupational History   Not on file  Tobacco Use   Smoking status: Every Day    Current packs/day: 0.75    Average packs/day: 0.8 packs/day for 46.6 years (35.0 ttl pk-yrs)    Types: Cigarettes    Start date: 1979   Smokeless tobacco: Never  Vaping Use   Vaping status: Never Used  Substance and Sexual Activity   Alcohol use: No   Drug use: No   Sexual activity: Not Currently    Birth control/protection: Post-menopausal  Other Topics Concern   Not on file  Social History Narrative   Not on file   Social Drivers of Health   Financial Resource Strain: High Risk (08/23/2023)   Overall Financial Resource Strain (CARDIA)    Difficulty of Paying Living Expenses: Very hard  Food Insecurity: Food Insecurity Present (08/23/2023)   Hunger Vital Sign    Worried About Running Out of Food in the Last Year: Often true    Ran Out of Food in the Last Year: Often true  Transportation Needs: No Transportation Needs (08/23/2023)   PRAPARE - Administrator, Civil Service (Medical): No    Lack of Transportation (Non-Medical): No  Physical Activity: Insufficiently Active (08/23/2023)   Exercise Vital Sign    Days of Exercise per Week: 7 days    Minutes of Exercise per Session: 20 min  Stress: Stress Concern Present (08/23/2023)   [employer] of Occupational Health - Occupational Stress Questionnaire    Feeling of Stress : Very much  Social Connections: Moderately Isolated (08/23/2023)   Social Connection and Isolation Panel    Frequency of Communication with Friends and Family:  More than three times a week    Frequency of Social Gatherings with Friends and Family: Not on file    Attends Religious Services: More than 4 times per year    Active Member of [employer] or Organizations: No    Attends Engineer, structural: Not on file    Marital Status: Divorced     Allergies:   Allergies  Allergen Reactions   Famotidine Nausea Only   Mirtazapine Other (See Comments)    Overly drowsy, couldn't function     Metabolic Disorder Labs: Lab Results  Component Value Date   HGBA1C 7.2 (A) 07/13/2023   No results found for: PROLACTIN Lab Results  Component Value Date   CHOL 124 07/31/2022   TRIG 78 07/31/2022  HDL 59 07/31/2022   CHOLHDL 2.1 07/31/2022   LDLCALC 49 07/31/2022   No results found for: TSH  Therapeutic Level Labs: No results found for: LITHIUM No results found for: CBMZ No results found for: VALPROATE  Current Medications: Current Outpatient Medications  Medication Sig Dispense Refill   Accu-Chek FastClix Lancets MISC Check blood sugar once daily.     amLODipine  (NORVASC ) 10 MG tablet TAKE 1 TABLET EVERY MORNING 90 tablet 1   ARIPiprazole  (ABILIFY ) 5 MG tablet Take 1 tablet (5 mg total) by mouth daily. 30 tablet 1   atorvastatin  (LIPITOR) 20 MG tablet Take 1 tablet (20 mg total) by mouth daily. 90 tablet 1   desvenlafaxine  (PRISTIQ ) 100 MG 24 hr tablet TAKE 1 TABLET DAILY 90 tablet 0   diclofenac  (VOLTAREN ) 75 MG EC tablet TAKE 1 TABLET TWICE A DAY 180 tablet 1   DULoxetine  (CYMBALTA ) 30 MG capsule TAKE 1 CAPSULE DAILY 90 capsule 0   ferrous gluconate (FERGON) 324 MG tablet      glucose blood (ACCU-CHEK GUIDE) test strip Check blood sugar once daily.     losartan -hydrochlorothiazide  (HYZAAR) 100-25 MG tablet TAKE 1 TABLET DAILY 90 tablet 1   metFORMIN  (GLUCOPHAGE ) 500 MG tablet Take 1 tablet (500 mg total) by mouth 2 (two) times daily with a meal. 180 tablet 1   metoprolol  succinate (TOPROL -XL) 100 MG 24 hr tablet Take 1  tablet (100 mg total) by mouth daily. 90 tablet 1   Multiple Vitamin (MULTIVITAMIN IRON-FREE) TABS Take 1 tablet by mouth daily.     Multiple Vitamins-Minerals (HAIR SKIN AND NAILS FORMULA) TABS Take 1 tablet by mouth daily.      Omega-3 Fatty Acids (FISH OIL) 1000 MG CAPS Take by mouth.     omeprazole  (PRILOSEC) 20 MG capsule Take 1 capsule (20 mg total) by mouth daily. 90 capsule 1   potassium chloride  SA (KLOR-CON  M) 20 MEQ tablet Take 1 tablet (20 mEq total) by mouth daily. 90 tablet 1   tirzepatide  (MOUNJARO ) 2.5 MG/0.5ML Pen Inject 2.5 mg into the skin once a week. 2 mL 3   traZODone  (DESYREL ) 100 MG tablet Take one to two at night 60 tablet 1   vitamin C (ASCORBIC ACID) 500 MG tablet Take 500 mg by mouth daily.     vitamin E 1000 UNIT capsule Take by mouth.     No current facility-administered medications for this visit.     Psychiatric Specialty Exam: Review of Systems  Cardiovascular:  Negative for chest pain.  Neurological:  Negative for tremors.  Psychiatric/Behavioral:  Negative for substance abuse and suicidal ideas.     There were no vitals taken for this visit.There is no height or weight on file to calculate BMI.  General Appearance: Casual  Eye Contact:  Fair  Speech:  Normal Rate  Volume:  Normal  Mood: fair  Affect:  Congruent  Thought Process:  Goal Directed  Orientation:  Full (Time, Place, and Person)  Thought Content:  Logical  Suicidal Thoughts:  No  Homicidal Thoughts:  No  Memory:  Immediate;   Fair Recent;   Fair  Judgement:  Fair  Insight:  Fair  Psychomotor Activity:  Normal  Concentration:  Concentration: Fair and Attention Span: Fair  Recall:  [employer] of Knowledge:Fair  Language: Fair  Akathisia:  No  Handed:  Right  AIMS (if indicated):  not done  Assets:  Desire for Improvement  ADL's:  intact  Cognition: WNL  Sleep:  Fair  Screenings: GAD-7    Flowsheet Row Office Visit from 04/13/2023 in [facility] at  [place] Office Visit from 10/29/2016 in [facility]  Total GAD-7 Score 8 20   PHQ2-9    Flowsheet Row Clinical Support from 08/24/2023 in [facility] [place] Office Visit from 07/13/2023 in [facility] [place] Office Visit from 04/13/2023 in [facility] [place] Office Visit from 12/15/2022 in [facility] [place] Office Visit from 05/21/2022 in [facility] [place]  PHQ-2 Total Score 1 2 4 6 6   PHQ-9 Total Score -- 9 -- 27 21   Flowsheet Row Video Visit from 12/23/2022 [facility] Visit from 02[facility]06/2022 in [facility] Video Visit from 10/03/2021 in [facility]  C-SSRS RISK CATEGORY No Risk No Risk No Risk    Assessment and Plan: as follows  Prior documentation reviewed   MDD moderate to severe:  manageable with some stressors continue abilify , labs reivewed  Continue cymbalta   GAD/ PTSD:worries can be challenging at times . Meds help and looking forward to the job again Continue cymbalta , pristiq  . Trying to focus  on here and now or future   Insomnia: continue sleep hygiene and trazadone Caution to prefer use one at night and not 2 together unless can't sleep and wait for second dose  Reviewed meds, questions addressed   Fu 6 m. SABRA Jackey Flight, MD 8/15/202510:38 AM

## 2023-11-09 ENCOUNTER — Ambulatory Visit: Admitting: Physical Medicine and Rehabilitation

## 2023-11-25 ENCOUNTER — Ambulatory Visit: Admitting: Orthopedic Surgery

## 2023-12-13 ENCOUNTER — Other Ambulatory Visit: Payer: Self-pay | Admitting: Family Medicine

## 2023-12-13 DIAGNOSIS — E876 Hypokalemia: Secondary | ICD-10-CM

## 2023-12-16 ENCOUNTER — Ambulatory Visit: Admitting: Physical Medicine and Rehabilitation

## 2024-01-10 ENCOUNTER — Encounter: Payer: Self-pay | Admitting: Physical Medicine and Rehabilitation

## 2024-01-10 ENCOUNTER — Ambulatory Visit: Admitting: Physical Medicine and Rehabilitation

## 2024-01-10 DIAGNOSIS — G8929 Other chronic pain: Secondary | ICD-10-CM

## 2024-01-10 DIAGNOSIS — M47816 Spondylosis without myelopathy or radiculopathy, lumbar region: Secondary | ICD-10-CM | POA: Diagnosis not present

## 2024-01-10 DIAGNOSIS — M47819 Spondylosis without myelopathy or radiculopathy, site unspecified: Secondary | ICD-10-CM

## 2024-01-10 DIAGNOSIS — M545 Low back pain, unspecified: Secondary | ICD-10-CM | POA: Diagnosis not present

## 2024-01-10 NOTE — Progress Notes (Unsigned)
 Pain Scale   Average Pain 8   Left-sided LBP for one year that occasionally spreads to the right side of the low back. Patient has had shooting pain down the left leg.

## 2024-01-10 NOTE — Progress Notes (Unsigned)
 Sandra Bean - 63 y.o. female MRN [id number]  Date of birth: [birth year]  Office Visit Note: Visit Date: 01/10/2024 PCP: No primary care provider on file. Referred by: Georgina Ozell LABOR, MD  Subjective: Chief Complaint  Patient presents with   Lower Back - Pain   HPI: Sandra Bean is a 63 y.o. female who comes in today per the request of Dr. Ozell Georgina for evaluation of chronic, worsening and severe bilateral lower back pain. Pain ongoing for over a year. Her pain worsens with movement and activity. Cleaning and cooking cause her pain to become severe. She describes pain as sore and sharp, currently rates as 8 out of 10. Some relief of pain with home exercise regimen, rest and use of medications. She does take Tylenol  as needed. No history of formal physical therapy. Lumbar MRI imaging from 2024 shows central canal stenosis at L3-L4 and L4-L5. Multi level facet arthropathy, most severe at L4-L5 and L5-S1. She was previously managed by Dr. Morene Mace with [facility]. She underwent (2) lumbar epidural steroid injections at [facility] earlier in the year with minimal relief of pain. Patient denies focal weakness, numbness and tingling. No recent trauma or falls.   Patients course is complicated by diabetes mellitus, tobacco abuse, anxiety, depression and PTSD. Last A1C was 7.2 on 07/13/2023.     Review of Systems  Musculoskeletal:  Positive for back pain.  Neurological:  Negative for tingling, sensory change, focal weakness and weakness.  All other systems reviewed and are negative.  Otherwise per HPI.  Assessment & Plan: Visit Diagnoses:    ICD-10-CM   1. Chronic bilateral low back pain without sciatica  M54.50    G89.29        Plan: Findings:  Chronic, worsening and severe bilateral axial back pain. No radicular symptoms down the legs. Patient continues to have severe pain despite good conservative therapies such as home exercise regimen, rest and use of  medications. Patients clinical presentation and exam are consistent with facet mediated pain. She has pain with lumbar extension today. There is facet arthropathy noted to L4-L5 and L5-S1. We discussed treatment plan in detail today. Next step is to place order for diagnostic bilateral L4-L5 and L5-S1 medial branch blocks under fluoroscopic guidance. If good relief of pain with diagnostic blocks we discussed longer sustained pain relief with radiofrequency ablation. Patient has no questions at this time. She does have central canal stenosis at L3-L4 and L4-L5, should her symptoms present as more radicular in nature we would consider trying lumbar epidural steroid injection. She has no questions at this time. No red flag symptoms noted upon exam today.     Meds & Orders: No orders of the defined types were placed in this encounter.  No orders of the defined types were placed in this encounter.   Follow-up: Return for Bilateral L4-L5 and L5-S1 medial branch blocks.   Procedures: No procedures performed      Clinical History: CLINICAL DATA:  Low back pain radiating down both legs since early September. No injury or prior surgery.   EXAM: MRI LUMBAR SPINE WITHOUT CONTRAST   TECHNIQUE: Multiplanar, multisequence MR imaging of the lumbar spine was performed. No intravenous contrast was administered.   COMPARISON:  Lumbar spine x-rays dated January 27, 2023.   FINDINGS: Segmentation:  Standard.   Alignment: Trace retrolisthesis at L1-L2, L2-L3, and L5-S1. Trace anterolisthesis at L4-L5.   Vertebrae: No fracture, evidence of discitis, or bone lesion. Mild asymmetric  left-sided degenerative endplate marrow edema at L2-L3.   Conus medullaris and cauda equina: Conus extends to the L1 level. Conus and cauda equina appear normal.   Paraspinal and other soft tissues: 2.4 cm right renal simple cyst. No follow-up imaging is recommended. Otherwise negative.   Disc levels:   T12-L1:  Small  left foraminal disc protrusion.  No stenosis.   L1-L2:  Mild disc bulging.  No stenosis.   L2-L3: Mild disc bulging and endplate spurring eccentric to the left with superimposed small left paracentral disc protrusion. Mild bilateral facet arthropathy. Mild spinal canal and mild-to-moderate left lateral recess stenosis. Moderate left-greater-than-right neuroforaminal stenosis.   L3-L4: Mild disc bulging with superimposed small central disc protrusion. Mild to moderate bilateral facet arthropathy. Moderate spinal canal stenosis. Moderate left and mild right neuroforaminal stenosis.   L4-L5: Disc uncovering and mild disc bulging. Severe bilateral facet arthropathy. Moderate to severe spinal canal stenosis based on the sagittal images. Mild bilateral neuroforaminal stenosis.   L5-S1: Mild disc bulging and moderate to severe bilateral facet arthropathy. Mild bilateral lateral recess stenosis. Moderate to severe right and moderate left neuroforaminal stenosis. No spinal canal stenosis.   IMPRESSION: 1. Multilevel lumbar spondylosis as described above. Moderate to severe spinal canal stenosis at L4-L5. 2. Moderate to severe right and moderate left neuroforaminal stenosis at L5-S1.     Electronically Signed   By: Elsie ONEIDA Shoulder M.D.   On: 02/09/2023 12:23   She reports that she has been smoking cigarettes. She started smoking about 46 years ago. She has a 35.1 pack-year smoking history. She has never used smokeless tobacco.  Recent Labs    04/13/23 1124 07/13/23 1116  HGBA1C 7.5* 7.2*    Objective:  VS:  HT:    WT:   BMI:     BP:   HR: bpm  TEMP: ( )  RESP:  Physical Exam Vitals and nursing note reviewed.  HENT:     Head: Normocephalic and atraumatic.     Right Ear: External ear normal.     Left Ear: External ear normal.     Nose: Nose normal.     Mouth/Throat:     Mouth: Mucous membranes are moist.  Eyes:     Extraocular Movements: Extraocular movements intact.   Cardiovascular:     Rate and Rhythm: Normal rate.     Pulses: Normal pulses.  Pulmonary:     Effort: Pulmonary effort is normal.  Abdominal:     General: Abdomen is flat. There is distension.  Musculoskeletal:        General: Tenderness present.     Cervical back: Normal range of motion.     Comments: Patient rises from seated position to standing without difficulty. Pain with facet loading and lumbar extension. 5/5 strength noted with bilateral hip flexion, knee flexion/extension, ankle dorsiflexion/plantarflexion and EHL. No clonus noted bilaterally. No pain upon palpation of greater trochanters. No pain with internal/external rotation of bilateral hips. Sensation intact bilaterally. Negative slump test bilaterally. Ambulates without aid, gait steady.     Skin:    General: Skin is warm and dry.     Capillary Refill: Capillary refill takes less than 2 seconds.  Neurological:     General: No focal deficit present.     Mental Status: She is alert and oriented to person, place, and time.  Psychiatric:        Mood and Affect: Mood normal.        Behavior: Behavior normal.  Ortho Exam  Imaging: No results found.  Past Medical/Family/Surgical/Social History: Medications & Allergies reviewed per EMR, new medications updated. Patient Active Problem List   Diagnosis Date Noted   Acute right hip pain 12/18/2022   Arthritis of shoulder region, left 04/21/2022   Hyperlipidemia LDL goal <100 09/12/2018   Varicose veins with pain 08/31/2018   IDA (iron deficiency anemia) 08/17/2018   History of partial hysterectomy 02/03/2017   Pelvic peritoneal adhesions, female 01/19/2017   Anemia due to chronic illness 01/19/2017   Benign essential hypertension 01/19/2017   Tobacco abuse 01/19/2017   Post-operative state 01/19/2017   Cigarette nicotine dependence with nicotine-induced disorder 01/19/2017   Fibroid uterus 10/26/2016   Abdominal pain in female 10/26/2016   Bacterial vaginitis  10/26/2016   Anxiety 10/12/2016   Depression 10/12/2016   PTSD (post-traumatic stress disorder) 10/12/2016   Type 2 diabetes mellitus without complication, without long-term current use of insulin (HCC) 10/12/2016   Past Medical History:  Diagnosis Date   Anemia    Anxiety    PTDS   Arthritis    RIGHT SIDE   COPD (chronic obstructive pulmonary disease) (HCC)    Depression    Diabetes mellitus without complication (HCC)    Type 2   Fibroids    Fibromyalgia    DAILY PAIN   GERD (gastroesophageal reflux disease)    H/O hernia repair 2009   Headache    Hypertension    Hyperthyroidism    NON PER PATIENT   Mental disorder    Mitral valve prolapse    Ovarian cyst    Family History  Problem Relation Age of Onset   Depression Mother    Alcohol abuse Mother    Hypertension Father    Alcohol abuse Sister    Diabetes Sister    Hypertension Sister    Diabetes Brother    Hypertension Brother    Past Surgical History:  Procedure Laterality Date   ABDOMINAL HYSTERECTOMY     BREAST BIOPSY Right 10/22/2023   US  RT BREAST BX W LOC DEV 1ST LESION IMG BX SPEC US  GUIDE 10/22/2023 [facility]MO[facility]   BREAST SURGERY     FOOT SURGERY     HERNIA REPAIR     LAPAROTOMY Bilateral 01/19/2017   Procedure: MINI-LAPAROTOMY TO REMOVE UTERUS, BILATERAL FALLOPIAN TUBES AND BILATERAL OVARIES;  Surgeon: Corene Coy, MD;  Location: [facility];  Service: Gynecology;  Laterality: Bilateral;   NOSE SURGERY     ROBOTIC ASSISTED TOTAL HYSTERECTOMY WITH BILATERAL SALPINGO OOPHERECTOMY Bilateral 01/19/2017   Procedure: ROBOTIC ASSISTED TOTAL LAPAROSCOPIC SUPRACERVICAL HYSTERECTOMY WITH BILATERAL SALPINGO OOPHORECTOMY WITH MINI LAPAROTOMY TO REMOVE SPECIMEN;  Surgeon: Corene Coy, MD;  Location: [facility];  Service: Gynecology;  Laterality: Bilateral;   THERAPEUTIC ABORTION     Social History   Occupational History   Not on file  Tobacco Use   Smoking status: Every Day    Current  packs/day: 0.75    Average packs/day: 0.8 packs/day for 46.8 years (35.1 ttl pk-yrs)    Types: Cigarettes    Start date: 1979   Smokeless tobacco: Never  Vaping Use   Vaping status: Never Used  Substance and Sexual Activity   Alcohol use: No   Drug use: No   Sexual activity: Not Currently    Birth control/protection: Post-menopausal

## 2024-01-13 ENCOUNTER — Encounter (INDEPENDENT_AMBULATORY_CARE_PROVIDER_SITE_OTHER): Payer: Self-pay

## 2024-01-20 ENCOUNTER — Other Ambulatory Visit (HOSPITAL_COMMUNITY): Payer: Self-pay | Admitting: Psychiatry

## 2024-01-20 DIAGNOSIS — F332 Major depressive disorder, recurrent severe without psychotic features: Secondary | ICD-10-CM

## 2024-01-24 ENCOUNTER — Encounter: Payer: Self-pay | Admitting: Radiology

## 2024-02-03 ENCOUNTER — Ambulatory Visit: Admitting: Physical Medicine and Rehabilitation

## 2024-02-03 ENCOUNTER — Other Ambulatory Visit: Payer: Self-pay

## 2024-02-03 VITALS — BP 156/100 | HR 65

## 2024-02-03 DIAGNOSIS — M47816 Spondylosis without myelopathy or radiculopathy, lumbar region: Secondary | ICD-10-CM

## 2024-02-03 MED ORDER — BUPIVACAINE HCL 0.5 % IJ SOLN
3.0000 mL | Freq: Once | INTRAMUSCULAR | Status: AC
  Administered 2024-02-03: 3 mL

## 2024-02-03 NOTE — Progress Notes (Signed)
 Sandra Bean - 63 y.o. female MRN [id number]  Date of birth: [birth year]  Office Visit Note: Visit Date: 02/03/2024 PCP: No primary care provider on file. Referred by: Trudy Duwaine BRAVO, NP  Subjective: Chief Complaint  Patient presents with   Lower Back - Pain   HPI:  Sandra Bean is a 63 y.o. female who comes in today at the request of Duwaine Trudy, FNP and Dr. Ozell Ada for planned Bilateral  L4-5 and L5-S1 Lumbar facet/medial branch block with fluoroscopic guidance.  The patient has failed conservative care including home exercise, medications, time and activity modification.  This injection will be diagnostic and hopefully therapeutic.  Please see requesting physician notes for further details and justification.  Exam has shown concordant pain with facet joint loading.   ROS Otherwise per HPI.  Assessment & Plan: Visit Diagnoses:    ICD-10-CM   1. Spondylosis without myelopathy or radiculopathy, lumbar region  M47.816 XR C-ARM NO REPORT    Nerve Block    bupivacaine  (MARCAINE ) 0.5 % (with pres) injection 3 mL      Plan: No additional findings.   Meds & Orders:  Meds ordered this encounter  Medications   bupivacaine  (MARCAINE ) 0.5 % (with pres) injection 3 mL    Orders Placed This Encounter  Procedures   Nerve Block   XR C-ARM NO REPORT    Follow-up: Return for Review Pain Diary.   Procedures: No procedures performed  Lumbar Diagnostic Facet Joint Nerve Block with Fluoroscopic Guidance   Patient: Sandra Bean      Date of Birth: 1960-10-24 MRN: [id number] PCP: No primary care provider on file.      Visit Date: 02/03/2024   Universal Protocol:    Date/Time: 11/13/254:06 PM  Consent Given By: the patient  Position: PRONE  Additional Comments: Vital signs were monitored before and after the procedure. Patient was prepped and draped in the usual sterile fashion. The correct patient, procedure, and site was verified.   Injection Procedure Details:    Procedure diagnoses:  1. Spondylosis without myelopathy or radiculopathy, lumbar region      Meds Administered:  Meds ordered this encounter  Medications   bupivacaine  (MARCAINE ) 0.5 % (with pres) injection 3 mL     Laterality: Bilateral  Location/Site: L4-L5, L3 and L4 medial branches and L5-S1, L4 medial branch and L5 dorsal ramus  Needle: 5.0 in., 25 ga.  Short bevel or Quincke spinal needle  Needle Placement: Oblique pedical  Findings:   -Comments: There was excellent flow of contrast along the articular pillars without intravascular flow.  Procedure Details: The fluoroscope beam is vertically oriented in AP and then obliqued 15 to 20 degrees to the ipsilateral side of the desired nerve to achieve the "Scotty dog" appearance.  The skin over the target area of the junction of the superior articulating process and the transverse process (sacral ala if blocking the L5 dorsal rami) was locally anesthetized with a 1 ml volume of 1% Lidocaine  without Epinephrine.  The spinal needle was inserted and advanced in a trajectory view down to the target.   After contact with periosteum and negative aspirate for blood and CSF, correct placement without intravascular or epidural spread was confirmed by injecting 0.5 ml. of Isovue -250.  A spot radiograph was obtained of this image.    Next, a 0.5 ml. volume of the injectate described above was injected. The needle was then redirected to the other facet joint nerves mentioned above if needed.  Prior to the procedure, the patient was given a Pain Diary which was completed for baseline measurements.  After the procedure, the patient rated their pain every 30 minutes and will continue rating at this frequency for a total of 5 hours.  The patient has been asked to complete the Diary and return to us  by mail, fax or hand delivered as soon as possible.   Additional Comments:  The patient tolerated the procedure well Dressing: 2 x 2 sterile gauze  and Band-Aid    Post-procedure details: Patient was observed during the procedure. Post-procedure instructions were reviewed.  Patient left the clinic in stable condition.   Clinical History: CLINICAL DATA:  Low back pain radiating down both legs since early September. No injury or prior surgery.   EXAM: MRI LUMBAR SPINE WITHOUT CONTRAST   TECHNIQUE: Multiplanar, multisequence MR imaging of the lumbar spine was performed. No intravenous contrast was administered.   COMPARISON:  Lumbar spine x-rays dated January 27, 2023.   FINDINGS: Segmentation:  Standard.   Alignment: Trace retrolisthesis at L1-L2, L2-L3, and L5-S1. Trace anterolisthesis at L4-L5.   Vertebrae: No fracture, evidence of discitis, or bone lesion. Mild asymmetric left-sided degenerative endplate marrow edema at L2-L3.   Conus medullaris and cauda equina: Conus extends to the L1 level. Conus and cauda equina appear normal.   Paraspinal and other soft tissues: 2.4 cm right renal simple cyst. No follow-up imaging is recommended. Otherwise negative.   Disc levels:   T12-L1:  Small left foraminal disc protrusion.  No stenosis.   L1-L2:  Mild disc bulging.  No stenosis.   L2-L3: Mild disc bulging and endplate spurring eccentric to the left with superimposed small left paracentral disc protrusion. Mild bilateral facet arthropathy. Mild spinal canal and mild-to-moderate left lateral recess stenosis. Moderate left-greater-than-right neuroforaminal stenosis.   L3-L4: Mild disc bulging with superimposed small central disc protrusion. Mild to moderate bilateral facet arthropathy. Moderate spinal canal stenosis. Moderate left and mild right neuroforaminal stenosis.   L4-L5: Disc uncovering and mild disc bulging. Severe bilateral facet arthropathy. Moderate to severe spinal canal stenosis based on the sagittal images. Mild bilateral neuroforaminal stenosis.   L5-S1: Mild disc bulging and moderate to severe  bilateral facet arthropathy. Mild bilateral lateral recess stenosis. Moderate to severe right and moderate left neuroforaminal stenosis. No spinal canal stenosis.   IMPRESSION: 1. Multilevel lumbar spondylosis as described above. Moderate to severe spinal canal stenosis at L4-L5. 2. Moderate to severe right and moderate left neuroforaminal stenosis at L5-S1.     Electronically Signed   By: Elsie ONEIDA Shoulder M.D.   On: 02/09/2023 12:23     Objective:  VS:  HT:    WT:   BMI:     BP:(!) 156/100  HR:65bpm  TEMP: ( )  RESP:  Physical Exam Vitals and nursing note reviewed.  Constitutional:      General: She is not in acute distress.    Appearance: Normal appearance. She is not ill-appearing.  HENT:     Head: Normocephalic and atraumatic.     Right Ear: External ear normal.     Left Ear: External ear normal.  Eyes:     Extraocular Movements: Extraocular movements intact.  Cardiovascular:     Rate and Rhythm: Normal rate.     Pulses: Normal pulses.  Pulmonary:     Effort: Pulmonary effort is normal. No respiratory distress.  Abdominal:     General: There is no distension.     Palpations: Abdomen is soft.  Musculoskeletal:        General: Tenderness present.     Cervical back: Neck supple.     Right lower leg: No edema.     Left lower leg: No edema.     Comments: Patient has good distal strength with no pain over the greater trochanters.  No clonus or focal weakness.  Skin:    Findings: No erythema, lesion or rash.  Neurological:     General: No focal deficit present.     Mental Status: She is alert and oriented to person, place, and time.     Sensory: No sensory deficit.     Motor: No weakness or abnormal muscle tone.     Coordination: Coordination normal.  Psychiatric:        Mood and Affect: Mood normal.        Behavior: Behavior normal.      Imaging: XR C-ARM NO REPORT Result Date: 02/03/2024 Please see Notes tab for imaging impression.

## 2024-02-03 NOTE — Procedures (Signed)
 Lumbar Diagnostic Facet Joint Nerve Block with Fluoroscopic Guidance   Patient: Sandra Bean      Date of Birth: July 06, 1960 MRN: [id number] PCP: No primary care provider on file.      Visit Date: 02/03/2024   Universal Protocol:    Date/Time: 11/13/254:06 PM  Consent Given By: the patient  Position: PRONE  Additional Comments: Vital signs were monitored before and after the procedure. Patient was prepped and draped in the usual sterile fashion. The correct patient, procedure, and site was verified.   Injection Procedure Details:   Procedure diagnoses:  1. Spondylosis without myelopathy or radiculopathy, lumbar region      Meds Administered:  Meds ordered this encounter  Medications   bupivacaine  (MARCAINE ) 0.5 % (with pres) injection 3 mL     Laterality: Bilateral  Location/Site: L4-L5, L3 and L4 medial branches and L5-S1, L4 medial branch and L5 dorsal ramus  Needle: 5.0 in., 25 ga.  Short bevel or Quincke spinal needle  Needle Placement: Oblique pedical  Findings:   -Comments: There was excellent flow of contrast along the articular pillars without intravascular flow.  Procedure Details: The fluoroscope beam is vertically oriented in AP and then obliqued 15 to 20 degrees to the ipsilateral side of the desired nerve to achieve the "Scotty dog" appearance.  The skin over the target area of the junction of the superior articulating process and the transverse process (sacral ala if blocking the L5 dorsal rami) was locally anesthetized with a 1 ml volume of 1% Lidocaine  without Epinephrine.  The spinal needle was inserted and advanced in a trajectory view down to the target.   After contact with periosteum and negative aspirate for blood and CSF, correct placement without intravascular or epidural spread was confirmed by injecting 0.5 ml. of Isovue -250.  A spot radiograph was obtained of this image.    Next, a 0.5 ml. volume of the injectate described above was injected.  The needle was then redirected to the other facet joint nerves mentioned above if needed.  Prior to the procedure, the patient was given a Pain Diary which was completed for baseline measurements.  After the procedure, the patient rated their pain every 30 minutes and will continue rating at this frequency for a total of 5 hours.  The patient has been asked to complete the Diary and return to us  by mail, fax or hand delivered as soon as possible.   Additional Comments:  The patient tolerated the procedure well Dressing: 2 x 2 sterile gauze and Band-Aid    Post-procedure details: Patient was observed during the procedure. Post-procedure instructions were reviewed.  Patient left the clinic in stable condition.

## 2024-02-03 NOTE — Progress Notes (Signed)
 Pain Scale   Average Pain 10 Patient advising she has chronic lower back that radiates to left leg at times. Patient advising her pain is chronic. Patient advising she has high blood pressure and she is on medication TID       +Driver, -BT, -Dye Allergies.

## 2024-02-08 ENCOUNTER — Other Ambulatory Visit (HOSPITAL_COMMUNITY): Payer: Self-pay | Admitting: Psychiatry

## 2024-02-23 ENCOUNTER — Telehealth: Payer: Self-pay

## 2024-02-23 ENCOUNTER — Other Ambulatory Visit: Payer: Self-pay | Admitting: Physical Medicine and Rehabilitation

## 2024-02-23 ENCOUNTER — Telehealth: Payer: Self-pay | Admitting: Physical Medicine and Rehabilitation

## 2024-02-23 DIAGNOSIS — M47816 Spondylosis without myelopathy or radiculopathy, lumbar region: Secondary | ICD-10-CM

## 2024-02-23 DIAGNOSIS — G8929 Other chronic pain: Secondary | ICD-10-CM

## 2024-02-23 NOTE — Telephone Encounter (Signed)
 Patient called and said she is waiting for approval for the second procedure abrasion.  She wanting to get scheduled. CB# [phone]

## 2024-02-23 NOTE — Telephone Encounter (Signed)
 Patient asking when she is going to have the 2nd MBB

## 2024-02-29 ENCOUNTER — Telehealth: Payer: Self-pay

## 2024-02-29 ENCOUNTER — Telehealth: Payer: Self-pay | Admitting: Physical Medicine and Rehabilitation

## 2024-02-29 NOTE — Telephone Encounter (Signed)
 Patient called today at 1:55 pm bc she has not heard about an appt for MBB. She states she dropped off pain diary on 11/13. It was scanned into chart but not sent to the [facility] team attention. She called on 12/03. The order was placed. As of today the order was not authorized by insurance. She was very upset and I placed her on speaker phone. I apologized to her many times and told her I would work on getting this expedited for. She has now been approved and I am about to call her to scheduled it.

## 2024-02-29 NOTE — Telephone Encounter (Signed)
 Tried calling patient. No answer. LMVM for her advising was returning her call to discuss concerns that she had. Did apologize that I was in a meeting at the time of her call and could not take her initial call.

## 2024-02-29 NOTE — Telephone Encounter (Signed)
 Pt called asking to speak to a Supervisor or manger. Called and spoke to Tinnie PHEBE Tinnie will call pt as soon as she can sometime today. Pt has some complaints. Please call pt at [phone].

## 2024-03-02 ENCOUNTER — Other Ambulatory Visit: Payer: Self-pay

## 2024-03-02 ENCOUNTER — Ambulatory Visit: Admitting: Physical Medicine and Rehabilitation

## 2024-03-02 VITALS — BP 151/86 | HR 70

## 2024-03-02 DIAGNOSIS — M47816 Spondylosis without myelopathy or radiculopathy, lumbar region: Secondary | ICD-10-CM

## 2024-03-02 MED ORDER — BUPIVACAINE HCL 0.5 % IJ SOLN
3.0000 mL | Freq: Once | INTRAMUSCULAR | Status: AC
  Administered 2024-03-02: 3 mL

## 2024-03-02 NOTE — Progress Notes (Unsigned)
 Pain Scale   Average Pain 9 Patient advising she has lower back pain radiating to left leg. Pain is constant        +Driver, -BT, -Dye Allergies.

## 2024-03-06 ENCOUNTER — Other Ambulatory Visit: Payer: Self-pay | Admitting: Physical Medicine and Rehabilitation

## 2024-03-06 DIAGNOSIS — M47816 Spondylosis without myelopathy or radiculopathy, lumbar region: Secondary | ICD-10-CM

## 2024-03-06 DIAGNOSIS — G8929 Other chronic pain: Secondary | ICD-10-CM

## 2024-03-09 ENCOUNTER — Encounter: Payer: Self-pay | Admitting: Acute Care

## 2024-03-13 NOTE — Progress Notes (Signed)
 "  Sandra Bean - 63 y.o. female MRN [id number]  Date of birth: Dec 02, 1960  Office Visit Note: Visit Date: 03/02/2024 PCP: No primary care provider on file. Referred by: Trudy Duwaine BRAVO, NP  Subjective: Chief Complaint  Patient presents with   Lower Back - Pain   HPI:  Sandra Bean is a 63 y.o. female who comes in today for planned repeat Bilateral L4-5 and L5-S1 Lumbar facet/medial branch block with fluoroscopic guidance.  The patient has failed conservative care including home exercise, medications, time and activity modification.  This injection will be diagnostic and hopefully therapeutic.  Please see requesting physician notes for further details and justification.  Exam shows concordant low back pain with facet joint loading and extension. Patient received more than 80% pain relief from prior injection. This would be the second block in a diagnostic double block paradigm.     Referring:Megan Trudy, FNP   ROS Otherwise per HPI.  Assessment & Plan: Visit Diagnoses:    ICD-10-CM   1. Spondylosis without myelopathy or radiculopathy, lumbar region  M47.816 XR C-ARM NO REPORT    Nerve Block    bupivacaine  (MARCAINE ) 0.5 % (with pres) injection 3 mL      Plan: No additional findings.   Meds & Orders:  Meds ordered this encounter  Medications   bupivacaine  (MARCAINE ) 0.5 % (with pres) injection 3 mL    Orders Placed This Encounter  Procedures   Nerve Block   XR C-ARM NO REPORT    Follow-up: Return for Review Pain Diary.   Procedures: No procedures performed  Lumbar Diagnostic Facet Joint Nerve Block with Fluoroscopic Guidance   Patient: Sandra Bean      Date of Birth: 06-08-1960 MRN: [id number] PCP: No primary care provider on file.      Visit Date: 03/02/2024   Universal Protocol:    Date/Time: 12/22/20255:53 AM  Consent Given By: the patient  Position: PRONE  Additional Comments: Vital signs were monitored before and after the procedure. Patient  was prepped and draped in the usual sterile fashion. The correct patient, procedure, and site was verified.   Injection Procedure Details:   Procedure diagnoses:  1. Spondylosis without myelopathy or radiculopathy, lumbar region      Meds Administered:  Meds ordered this encounter  Medications   bupivacaine  (MARCAINE ) 0.5 % (with pres) injection 3 mL     Laterality: Bilateral  Location/Site: L4-L5, L3 and L4 medial branches and L5-S1, L4 medial branch and L5 dorsal ramus  Needle: 5.0 in., 25 ga.  Short bevel or Quincke spinal needle  Needle Placement: Oblique pedical  Findings:   -Comments: There was excellent flow of contrast along the articular pillars without intravascular flow.  Procedure Details: The fluoroscope beam is vertically oriented in AP and then obliqued 15 to 20 degrees to the ipsilateral side of the desired nerve to achieve the Scotty dog appearance.  The skin over the target area of the junction of the superior articulating process and the transverse process (sacral ala if blocking the L5 dorsal rami) was locally anesthetized with a 1 ml volume of 1% Lidocaine  without Epinephrine.  The spinal needle was inserted and advanced in a trajectory view down to the target.   After contact with periosteum and negative aspirate for blood and CSF, correct placement without intravascular or epidural spread was confirmed by injecting 0.5 ml. of Isovue -250.  A spot radiograph was obtained of this image.    Next, a 0.5 ml. volume of  the injectate described above was injected. The needle was then redirected to the other facet joint nerves mentioned above if needed.  Prior to the procedure, the patient was given a Pain Diary which was completed for baseline measurements.  After the procedure, the patient rated their pain every 30 minutes and will continue rating at this frequency for a total of 5 hours.  The patient has been asked to complete the Diary and return to us  by mail,  fax or hand delivered as soon as possible.   Additional Comments:  The patient tolerated the procedure well Dressing: 2 x 2 sterile gauze and Band-Aid    Post-procedure details: Patient was observed during the procedure. Post-procedure instructions were reviewed.  Patient left the clinic in stable condition.   Clinical History: CLINICAL DATA:  Low back pain radiating down both legs since early September. No injury or prior surgery.   EXAM: MRI LUMBAR SPINE WITHOUT CONTRAST   TECHNIQUE: Multiplanar, multisequence MR imaging of the lumbar spine was performed. No intravenous contrast was administered.   COMPARISON:  Lumbar spine x-rays dated January 27, 2023.   FINDINGS: Segmentation:  Standard.   Alignment: Trace retrolisthesis at L1-L2, L2-L3, and L5-S1. Trace anterolisthesis at L4-L5.   Vertebrae: No fracture, evidence of discitis, or bone lesion. Mild asymmetric left-sided degenerative endplate marrow edema at L2-L3.   Conus medullaris and cauda equina: Conus extends to the L1 level. Conus and cauda equina appear normal.   Paraspinal and other soft tissues: 2.4 cm right renal simple cyst. No follow-up imaging is recommended. Otherwise negative.   Disc levels:   T12-L1:  Small left foraminal disc protrusion.  No stenosis.   L1-L2:  Mild disc bulging.  No stenosis.   L2-L3: Mild disc bulging and endplate spurring eccentric to the left with superimposed small left paracentral disc protrusion. Mild bilateral facet arthropathy. Mild spinal canal and mild-to-moderate left lateral recess stenosis. Moderate left-greater-than-right neuroforaminal stenosis.   L3-L4: Mild disc bulging with superimposed small central disc protrusion. Mild to moderate bilateral facet arthropathy. Moderate spinal canal stenosis. Moderate left and mild right neuroforaminal stenosis.   L4-L5: Disc uncovering and mild disc bulging. Severe bilateral facet arthropathy. Moderate to severe  spinal canal stenosis based on the sagittal images. Mild bilateral neuroforaminal stenosis.   L5-S1: Mild disc bulging and moderate to severe bilateral facet arthropathy. Mild bilateral lateral recess stenosis. Moderate to severe right and moderate left neuroforaminal stenosis. No spinal canal stenosis.   IMPRESSION: 1. Multilevel lumbar spondylosis as described above. Moderate to severe spinal canal stenosis at L4-L5. 2. Moderate to severe right and moderate left neuroforaminal stenosis at L5-S1.     Electronically Signed   By: Elsie ONEIDA Shoulder M.D.   On: 02/09/2023 12:23     Objective:  VS:  HT:    WT:   BMI:     BP:(!) 151/86  HR:70bpm  TEMP: ( )  RESP:  Physical Exam Vitals and nursing note reviewed.  Constitutional:      General: She is not in acute distress.    Appearance: Normal appearance. She is not ill-appearing.  HENT:     Head: Normocephalic and atraumatic.     Right Ear: External ear normal.     Left Ear: External ear normal.  Eyes:     Extraocular Movements: Extraocular movements intact.  Cardiovascular:     Rate and Rhythm: Normal rate.     Pulses: Normal pulses.  Pulmonary:     Effort: Pulmonary effort is normal. No  respiratory distress.  Abdominal:     General: There is no distension.     Palpations: Abdomen is soft.  Musculoskeletal:        General: Tenderness present.     Cervical back: Neck supple.     Right lower leg: No edema.     Left lower leg: No edema.     Comments: Patient has good distal strength with no pain over the greater trochanters.  No clonus or focal weakness.  Skin:    Findings: No erythema, lesion or rash.  Neurological:     General: No focal deficit present.     Mental Status: She is alert and oriented to person, place, and time.     Sensory: No sensory deficit.     Motor: No weakness or abnormal muscle tone.     Coordination: Coordination normal.  Psychiatric:        Mood and Affect: Mood normal.        Behavior:  Behavior normal.      Imaging: No results found. "

## 2024-03-13 NOTE — Procedures (Signed)
 Lumbar Diagnostic Facet Joint Nerve Block with Fluoroscopic Guidance   Patient: Sandra Bean      Date of Birth: September 22, 1960 MRN: [id number] PCP: No primary care provider on file.      Visit Date: 03/02/2024   Universal Protocol:    Date/Time: 12/22/20255:53 AM  Consent Given By: the patient  Position: PRONE  Additional Comments: Vital signs were monitored before and after the procedure. Patient was prepped and draped in the usual sterile fashion. The correct patient, procedure, and site was verified.   Injection Procedure Details:   Procedure diagnoses:  1. Spondylosis without myelopathy or radiculopathy, lumbar region      Meds Administered:  Meds ordered this encounter  Medications   bupivacaine  (MARCAINE ) 0.5 % (with pres) injection 3 mL     Laterality: Bilateral  Location/Site: L4-L5, L3 and L4 medial branches and L5-S1, L4 medial branch and L5 dorsal ramus  Needle: 5.0 in., 25 ga.  Short bevel or Quincke spinal needle  Needle Placement: Oblique pedical  Findings:   -Comments: There was excellent flow of contrast along the articular pillars without intravascular flow.  Procedure Details: The fluoroscope beam is vertically oriented in AP and then obliqued 15 to 20 degrees to the ipsilateral side of the desired nerve to achieve the Scotty dog appearance.  The skin over the target area of the junction of the superior articulating process and the transverse process (sacral ala if blocking the L5 dorsal rami) was locally anesthetized with a 1 ml volume of 1% Lidocaine  without Epinephrine.  The spinal needle was inserted and advanced in a trajectory view down to the target.   After contact with periosteum and negative aspirate for blood and CSF, correct placement without intravascular or epidural spread was confirmed by injecting 0.5 ml. of Isovue -250.  A spot radiograph was obtained of this image.    Next, a 0.5 ml. volume of the injectate described above was  injected. The needle was then redirected to the other facet joint nerves mentioned above if needed.  Prior to the procedure, the patient was given a Pain Diary which was completed for baseline measurements.  After the procedure, the patient rated their pain every 30 minutes and will continue rating at this frequency for a total of 5 hours.  The patient has been asked to complete the Diary and return to us  by mail, fax or hand delivered as soon as possible.   Additional Comments:  The patient tolerated the procedure well Dressing: 2 x 2 sterile gauze and Band-Aid    Post-procedure details: Patient was observed during the procedure. Post-procedure instructions were reviewed.  Patient left the clinic in stable condition.

## 2024-03-20 ENCOUNTER — Other Ambulatory Visit: Payer: Self-pay | Admitting: Family Medicine

## 2024-03-20 ENCOUNTER — Other Ambulatory Visit (HOSPITAL_COMMUNITY): Payer: Self-pay | Admitting: Psychiatry

## 2024-03-20 DIAGNOSIS — F332 Major depressive disorder, recurrent severe without psychotic features: Secondary | ICD-10-CM

## 2024-03-20 DIAGNOSIS — I10 Essential (primary) hypertension: Secondary | ICD-10-CM

## 2024-03-21 NOTE — Progress Notes (Signed)
 Virtual Visit via Telephone Note  I connected with Sandra Bean on 03/21/2024 at  9:30 AM EDT by telephone and verified that I am speaking with the correct person using two identifiers.  Location: Patient: at home Provider: [address] [place], [place], [address]    I discussed the limitations, risks, security and privacy concerns of performing an evaluation and management service by telephone and the availability of in person appointments. I also discussed with the patient that there may be a patient responsible charge related to this service. The patient expressed understanding and agreed to proceed.   Smoking/Tobacco Cessation Counseling Sandra Bean is a current user of tobacco or nicotine products. She is considering quitting at this time. Counseling provided today addressed the risks of continued use and the benefits of cessation. Discussed tobacco/nicotine use history, readiness to quit, and evidence-based treatment options including behavioral strategies, support resources, and pharmacologic therapies. Provided encouragement and educational materials on steps and resources to quit smoking. Patient questions were addressed, and follow-up recommended for continued support. Total time spent on counseling: 4 minutes.

## 2024-03-29 ENCOUNTER — Ambulatory Visit: Admitting: Obstetrics and Gynecology

## 2024-03-30 ENCOUNTER — Ambulatory Visit: Admitting: Physical Medicine and Rehabilitation

## 2024-03-30 ENCOUNTER — Other Ambulatory Visit: Payer: Self-pay

## 2024-03-30 VITALS — BP 136/88 | HR 80

## 2024-03-30 DIAGNOSIS — M47816 Spondylosis without myelopathy or radiculopathy, lumbar region: Secondary | ICD-10-CM | POA: Diagnosis not present

## 2024-03-30 MED ORDER — METHYLPREDNISOLONE ACETATE 40 MG/ML IJ SUSP: 40.0000 mg | Freq: Once | INTRAMUSCULAR | Status: DC

## 2024-03-30 NOTE — Progress Notes (Unsigned)
 Pain Scale   Average Pain 8 Patient advising she has chronic lower back pain radiating to left leg pain increases when walking and standing and decreases when sitting        +Driver, -BT, -Dye Allergies.

## 2024-04-04 NOTE — Procedures (Signed)
 Lumbar Facet Joint Nerve Denervation  Patient: Sandra Bean      Date of Birth: 1960/09/15 MRN: [id number] PCP: No primary care provider on file.      Visit Date: 03/30/2024   Universal Protocol:    Date/Time: 1/13/20269:04 PM  Consent Given By: the patient  Position: PRONE  Additional Comments: Vital signs were monitored before and after the procedure. Patient was prepped and draped in the usual sterile fashion. The correct patient, procedure, and site was verified.   Injection Procedure Details:   Procedure diagnoses:  1. Spondylosis without myelopathy or radiculopathy, lumbar region      Meds Administered:  Meds ordered this encounter  Medications   methylPREDNISolone  acetate (DEPO-MEDROL ) injection 40 mg     Laterality: Left  Location/Site:  L4-L5, L3 and L4 medial branches and L5-S1, L4 medial branch and L5 dorsal ramus  Needle: 18 ga.,  10mm active tip, RF Cannula  Needle Placement: Along juncture of superior articular process and transverse pocess  Findings:  -Comments:  Procedure Details: For each desired target nerve, the corresponding transverse process (sacral ala for the L5 dorsal rami) was identified and the fluoroscope was positioned to square off the endplates of the corresponding vertebral body to achieve a true AP midline view.  The beam was then obliqued 15 to 20 degrees and caudally tilted 15 to 20 degrees to line up a trajectory along the target nerves. The skin over the target of the junction of superior articulating process and transverse process (sacral ala for the L5 dorsal rami) was infiltrated with 1ml of 1% Lidocaine  without Epinephrine.  The 18 gauge 10mm active tip outer cannula was advanced in trajectory view to the target.  This procedure was repeated for each target nerve.  Then, for all levels, the outer cannula placement was fine-tuned and the position was then confirmed with bi-planar imaging.    Test stimulation was done both  at sensory and motor levels to ensure there was no radicular stimulation. The target tissues were then infiltrated with 1 ml of 1% Lidocaine  without Epinephrine. Subsequently, a percutaneous neurotomy was carried out for 90 seconds at 80 degrees Celsius.  After the completion of the lesion, 1 ml of injectate was delivered. It was then repeated for each facet joint nerve mentioned above. Appropriate radiographs were obtained to verify the probe placement during the neurotomy.   Additional Comments:  The patient tolerated the procedure well Dressing: 2 x 2 sterile gauze and Band-Aid    Post-procedure details: Patient was observed during the procedure. Post-procedure instructions were reviewed.  Patient left the clinic in stable condition.

## 2024-04-04 NOTE — Progress Notes (Signed)
 "  Sandra Bean - 64 y.o. female MRN [id number]  Date of birth: 12-Aug-1960  Office Visit Note: Visit Date: 03/30/2024 PCP: No primary care provider on file. Referred by: Trudy Duwaine BRAVO, NP  Subjective: Chief Complaint  Patient presents with   Lower Back - Pain   HPI:  Sandra Bean is a 64 y.o. female who comes in todayfor planned radiofrequency ablation of the Left L4-5 and L5-S1 Lumbar facet joints. This would be ablation of the corresponding medial branches and/or dorsal rami.  Patient has had double diagnostic blocks with more than 50% relief.  These are documented on pain diary.  They have had chronic back pain for quite some time, more than 3 months, which has been an ongoing situation with recalcitrant axial back pain.  They have no radicular pain.  Their axial pain is worse with standing and ambulating and on exam today with facet loading.  They have had physical therapy as well as home exercise program.  The imaging noted in the chart below indicated facet pathology. Accordingly they meet all the criteria and qualification for for radiofrequency ablation and we are going to complete this today hopefully for more longer term relief as part of comprehensive management program.   ROS Otherwise per HPI.  Assessment & Plan: Visit Diagnoses:    ICD-10-CM   1. Spondylosis without myelopathy or radiculopathy, lumbar region  M47.816 XR C-ARM NO REPORT    Radiofrequency,Lumbar    methylPREDNISolone  acetate (DEPO-MEDROL ) injection 40 mg      Plan: No additional findings.   Meds & Orders:  Meds ordered this encounter  Medications   methylPREDNISolone  acetate (DEPO-MEDROL ) injection 40 mg    Orders Placed This Encounter  Procedures   Radiofrequency,Lumbar   XR C-ARM NO REPORT    Follow-up: Return for visit to requesting provider as needed.   Procedures: No procedures performed  Lumbar Facet Joint Nerve Denervation  Patient: Sandra Bean      Date of Birth: 29-Nov-1960 MRN:  [id number] PCP: No primary care provider on file.      Visit Date: 03/30/2024   Universal Protocol:    Date/Time: 1/13/20269:04 PM  Consent Given By: the patient  Position: PRONE  Additional Comments: Vital signs were monitored before and after the procedure. Patient was prepped and draped in the usual sterile fashion. The correct patient, procedure, and site was verified.   Injection Procedure Details:   Procedure diagnoses:  1. Spondylosis without myelopathy or radiculopathy, lumbar region      Meds Administered:  Meds ordered this encounter  Medications   methylPREDNISolone  acetate (DEPO-MEDROL ) injection 40 mg     Laterality: Left  Location/Site:  L4-L5, L3 and L4 medial branches and L5-S1, L4 medial branch and L5 dorsal ramus  Needle: 18 ga.,  10mm active tip, RF Cannula  Needle Placement: Along juncture of superior articular process and transverse pocess  Findings:  -Comments:  Procedure Details: For each desired target nerve, the corresponding transverse process (sacral ala for the L5 dorsal rami) was identified and the fluoroscope was positioned to square off the endplates of the corresponding vertebral body to achieve a true AP midline view.  The beam was then obliqued 15 to 20 degrees and caudally tilted 15 to 20 degrees to line up a trajectory along the target nerves. The skin over the target of the junction of superior articulating process and transverse process (sacral ala for the L5 dorsal rami) was infiltrated with 1ml of 1% Lidocaine  without  Epinephrine.  The 18 gauge 10mm active tip outer cannula was advanced in trajectory view to the target.  This procedure was repeated for each target nerve.  Then, for all levels, the outer cannula placement was fine-tuned and the position was then confirmed with bi-planar imaging.    Test stimulation was done both at sensory and motor levels to ensure there was no radicular stimulation. The target tissues were  then infiltrated with 1 ml of 1% Lidocaine  without Epinephrine. Subsequently, a percutaneous neurotomy was carried out for 90 seconds at 80 degrees Celsius.  After the completion of the lesion, 1 ml of injectate was delivered. It was then repeated for each facet joint nerve mentioned above. Appropriate radiographs were obtained to verify the probe placement during the neurotomy.   Additional Comments:  The patient tolerated the procedure well Dressing: 2 x 2 sterile gauze and Band-Aid    Post-procedure details: Patient was observed during the procedure. Post-procedure instructions were reviewed.  Patient left the clinic in stable condition.      Clinical History: CLINICAL DATA:  Low back pain radiating down both legs since early September. No injury or prior surgery.   EXAM: MRI LUMBAR SPINE WITHOUT CONTRAST   TECHNIQUE: Multiplanar, multisequence MR imaging of the lumbar spine was performed. No intravenous contrast was administered.   COMPARISON:  Lumbar spine x-rays dated January 27, 2023.   FINDINGS: Segmentation:  Standard.   Alignment: Trace retrolisthesis at L1-L2, L2-L3, and L5-S1. Trace anterolisthesis at L4-L5.   Vertebrae: No fracture, evidence of discitis, or bone lesion. Mild asymmetric left-sided degenerative endplate marrow edema at L2-L3.   Conus medullaris and cauda equina: Conus extends to the L1 level. Conus and cauda equina appear normal.   Paraspinal and other soft tissues: 2.4 cm right renal simple cyst. No follow-up imaging is recommended. Otherwise negative.   Disc levels:   T12-L1:  Small left foraminal disc protrusion.  No stenosis.   L1-L2:  Mild disc bulging.  No stenosis.   L2-L3: Mild disc bulging and endplate spurring eccentric to the left with superimposed small left paracentral disc protrusion. Mild bilateral facet arthropathy. Mild spinal canal and mild-to-moderate left lateral recess stenosis. Moderate  left-greater-than-right neuroforaminal stenosis.   L3-L4: Mild disc bulging with superimposed small central disc protrusion. Mild to moderate bilateral facet arthropathy. Moderate spinal canal stenosis. Moderate left and mild right neuroforaminal stenosis.   L4-L5: Disc uncovering and mild disc bulging. Severe bilateral facet arthropathy. Moderate to severe spinal canal stenosis based on the sagittal images. Mild bilateral neuroforaminal stenosis.   L5-S1: Mild disc bulging and moderate to severe bilateral facet arthropathy. Mild bilateral lateral recess stenosis. Moderate to severe right and moderate left neuroforaminal stenosis. No spinal canal stenosis.   IMPRESSION: 1. Multilevel lumbar spondylosis as described above. Moderate to severe spinal canal stenosis at L4-L5. 2. Moderate to severe right and moderate left neuroforaminal stenosis at L5-S1.     Electronically Signed   By: Elsie ONEIDA Shoulder M.D.   On: 02/09/2023 12:23     Objective:  VS:  HT:    WT:   BMI:     BP:136/88  HR:80bpm  TEMP: ( )  RESP:  Physical Exam Vitals and nursing note reviewed.  Constitutional:      General: She is not in acute distress.    Appearance: Normal appearance. She is not ill-appearing.  HENT:     Head: Normocephalic and atraumatic.     Right Ear: External ear normal.     Left  Ear: External ear normal.  Eyes:     Extraocular Movements: Extraocular movements intact.  Cardiovascular:     Rate and Rhythm: Normal rate.     Pulses: Normal pulses.  Pulmonary:     Effort: Pulmonary effort is normal. No respiratory distress.  Abdominal:     General: There is no distension.     Palpations: Abdomen is soft.  Musculoskeletal:        General: Tenderness present.     Cervical back: Neck supple.     Right lower leg: No edema.     Left lower leg: No edema.     Comments: Patient has good distal strength with no pain over the greater trochanters.  No clonus or focal weakness.  Skin:     Findings: No erythema, lesion or rash.  Neurological:     General: No focal deficit present.     Mental Status: She is alert and oriented to person, place, and time.     Sensory: No sensory deficit.     Motor: No weakness or abnormal muscle tone.     Coordination: Coordination normal.  Psychiatric:        Mood and Affect: Mood normal.        Behavior: Behavior normal.      Imaging: No results found. "

## 2024-04-13 ENCOUNTER — Other Ambulatory Visit: Payer: Self-pay

## 2024-04-13 ENCOUNTER — Ambulatory Visit: Admitting: Physical Medicine and Rehabilitation

## 2024-04-13 VITALS — BP 152/94 | HR 76

## 2024-04-13 DIAGNOSIS — M47816 Spondylosis without myelopathy or radiculopathy, lumbar region: Secondary | ICD-10-CM

## 2024-04-13 MED ORDER — METHYLPREDNISOLONE ACETATE 40 MG/ML IJ SUSP
40.0000 mg | Freq: Once | INTRAMUSCULAR | Status: AC
  Administered 2024-04-13: 40 mg

## 2024-04-13 NOTE — Progress Notes (Signed)
 Pain Scale   Average Pain 7 Patient advising she has lower back pain that radiates to right hip area pain is constant.        +Driver, -BT, -Dye Allergies.

## 2024-04-18 NOTE — Progress Notes (Signed)
 "  Sandra Bean - 64 y.o. female MRN [id number]  Date of birth: February 17, 1961  Office Visit Note: Visit Date: 04/13/2024 PCP: No primary care provider on file. Referred by: No ref. provider found  Subjective: Chief Complaint  Patient presents with   Lower Back - Pain   HPI:  Sandra Bean is a 64 y.o. female who comes in todayfor planned radiofrequency ablation of the Right L4-5 and L5-S1 Lumbar facet joints. This would be ablation of the corresponding medial branches and/or dorsal rami.  Patient has had double diagnostic blocks with more than 50% relief.  These are documented on pain diary.  They have had chronic back pain for quite some time, more than 3 months, which has been an ongoing situation with recalcitrant axial back pain.  They have no radicular pain.  Their axial pain is worse with standing and ambulating and on exam today with facet loading.  They have had physical therapy as well as home exercise program.  The imaging noted in the chart below indicated facet pathology. Accordingly they meet all the criteria and qualification for for radiofrequency ablation and we are going to complete this today hopefully for more longer term relief as part of comprehensive management program.   ROS Otherwise per HPI.  Assessment & Plan: Visit Diagnoses:    ICD-10-CM   1. Spondylosis without myelopathy or radiculopathy, lumbar region  M47.816 XR C-ARM NO REPORT    Radiofrequency,Lumbar    methylPREDNISolone  acetate (DEPO-MEDROL ) injection 40 mg      Plan: No additional findings.   Meds & Orders:  Meds ordered this encounter  Medications   methylPREDNISolone  acetate (DEPO-MEDROL ) injection 40 mg    Orders Placed This Encounter  Procedures   Radiofrequency,Lumbar   XR C-ARM NO REPORT    Follow-up: Return for visit to requesting provider as needed.   Procedures: No procedures performed  Lumbar Facet Joint Nerve Denervation  Patient: Sandra Bean      Date of Birth:  [birth year] MRN: [id number] PCP: No primary care provider on file.      Visit Date: 04/13/2024   Universal Protocol:    Date/Time: 04/18/2608:36 AM  Consent Given By: the patient  Position: PRONE  Additional Comments: Vital signs were monitored before and after the procedure. Patient was prepped and draped in the usual sterile fashion. The correct patient, procedure, and site was verified.   Injection Procedure Details:   Procedure diagnoses:  1. Spondylosis without myelopathy or radiculopathy, lumbar region      Meds Administered:  Meds ordered this encounter  Medications   methylPREDNISolone  acetate (DEPO-MEDROL ) injection 40 mg     Laterality: Right  Location/Site:  L4-L5, L3 and L4 medial branches and L5-S1, L4 medial branch and L5 dorsal ramus  Needle: 18 ga.,  10mm active tip, RF Cannula  Needle Placement: Along juncture of superior articular process and transverse pocess  Findings:  -Comments:  Procedure Details: For each desired target nerve, the corresponding transverse process (sacral ala for the L5 dorsal rami) was identified and the fluoroscope was positioned to square off the endplates of the corresponding vertebral body to achieve a true AP midline view.  The beam was then obliqued 15 to 20 degrees and caudally tilted 15 to 20 degrees to line up a trajectory along the target nerves. The skin over the target of the junction of superior articulating process and transverse process (sacral ala for the L5 dorsal rami) was infiltrated with 1ml of 1% Lidocaine  without  Epinephrine.  The 18 gauge 10mm active tip outer cannula was advanced in trajectory view to the target.  This procedure was repeated for each target nerve.  Then, for all levels, the outer cannula placement was fine-tuned and the position was then confirmed with bi-planar imaging.    Test stimulation was done both at sensory and motor levels to ensure there was no radicular stimulation. The target  tissues were then infiltrated with 1 ml of 1% Lidocaine  without Epinephrine. Subsequently, a percutaneous neurotomy was carried out for 90 seconds at 80 degrees Celsius.  After the completion of the lesion, 1 ml of injectate was delivered. It was then repeated for each facet joint nerve mentioned above. Appropriate radiographs were obtained to verify the probe placement during the neurotomy.   Additional Comments:  The patient tolerated the procedure well Dressing: 2 x 2 sterile gauze and Band-Aid    Post-procedure details: Patient was observed during the procedure. Post-procedure instructions were reviewed.  Patient left the clinic in stable condition.      Clinical History: CLINICAL DATA:  Low back pain radiating down both legs since early September. No injury or prior surgery.   EXAM: MRI LUMBAR SPINE WITHOUT CONTRAST   TECHNIQUE: Multiplanar, multisequence MR imaging of the lumbar spine was performed. No intravenous contrast was administered.   COMPARISON:  Lumbar spine x-rays dated January 27, 2023.   FINDINGS: Segmentation:  Standard.   Alignment: Trace retrolisthesis at L1-L2, L2-L3, and L5-S1. Trace anterolisthesis at L4-L5.   Vertebrae: No fracture, evidence of discitis, or bone lesion. Mild asymmetric left-sided degenerative endplate marrow edema at L2-L3.   Conus medullaris and cauda equina: Conus extends to the L1 level. Conus and cauda equina appear normal.   Paraspinal and other soft tissues: 2.4 cm right renal simple cyst. No follow-up imaging is recommended. Otherwise negative.   Disc levels:   T12-L1:  Small left foraminal disc protrusion.  No stenosis.   L1-L2:  Mild disc bulging.  No stenosis.   L2-L3: Mild disc bulging and endplate spurring eccentric to the left with superimposed small left paracentral disc protrusion. Mild bilateral facet arthropathy. Mild spinal canal and mild-to-moderate left lateral recess stenosis. Moderate  left-greater-than-right neuroforaminal stenosis.   L3-L4: Mild disc bulging with superimposed small central disc protrusion. Mild to moderate bilateral facet arthropathy. Moderate spinal canal stenosis. Moderate left and mild right neuroforaminal stenosis.   L4-L5: Disc uncovering and mild disc bulging. Severe bilateral facet arthropathy. Moderate to severe spinal canal stenosis based on the sagittal images. Mild bilateral neuroforaminal stenosis.   L5-S1: Mild disc bulging and moderate to severe bilateral facet arthropathy. Mild bilateral lateral recess stenosis. Moderate to severe right and moderate left neuroforaminal stenosis. No spinal canal stenosis.   IMPRESSION: 1. Multilevel lumbar spondylosis as described above. Moderate to severe spinal canal stenosis at L4-L5. 2. Moderate to severe right and moderate left neuroforaminal stenosis at L5-S1.     Electronically Signed   By: Elsie ONEIDA Shoulder M.D.   On: 02/09/2023 12:23     Objective:  VS:  HT:    WT:   BMI:     BP:(!) 152/94  HR:76bpm  TEMP: ( )  RESP:  Physical Exam   Imaging: No results found. "

## 2024-04-18 NOTE — Procedures (Signed)
 Lumbar Facet Joint Nerve Denervation  Patient: Sandra Bean      Date of Birth: 1961/02/23 MRN: [id number] PCP: No primary care provider on file.      Visit Date: 04/13/2024   Universal Protocol:    Date/Time: 04/18/2608:36 AM  Consent Given By: the patient  Position: PRONE  Additional Comments: Vital signs were monitored before and after the procedure. Patient was prepped and draped in the usual sterile fashion. The correct patient, procedure, and site was verified.   Injection Procedure Details:   Procedure diagnoses:  1. Spondylosis without myelopathy or radiculopathy, lumbar region      Meds Administered:  Meds ordered this encounter  Medications   methylPREDNISolone  acetate (DEPO-MEDROL ) injection 40 mg     Laterality: Right  Location/Site:  L4-L5, L3 and L4 medial branches and L5-S1, L4 medial branch and L5 dorsal ramus  Needle: 18 ga.,  10mm active tip, RF Cannula  Needle Placement: Along juncture of superior articular process and transverse pocess  Findings:  -Comments:  Procedure Details: For each desired target nerve, the corresponding transverse process (sacral ala for the L5 dorsal rami) was identified and the fluoroscope was positioned to square off the endplates of the corresponding vertebral body to achieve a true AP midline view.  The beam was then obliqued 15 to 20 degrees and caudally tilted 15 to 20 degrees to line up a trajectory along the target nerves. The skin over the target of the junction of superior articulating process and transverse process (sacral ala for the L5 dorsal rami) was infiltrated with 1ml of 1% Lidocaine  without Epinephrine.  The 18 gauge 10mm active tip outer cannula was advanced in trajectory view to the target.  This procedure was repeated for each target nerve.  Then, for all levels, the outer cannula placement was fine-tuned and the position was then confirmed with bi-planar imaging.    Test stimulation was done both  at sensory and motor levels to ensure there was no radicular stimulation. The target tissues were then infiltrated with 1 ml of 1% Lidocaine  without Epinephrine. Subsequently, a percutaneous neurotomy was carried out for 90 seconds at 80 degrees Celsius.  After the completion of the lesion, 1 ml of injectate was delivered. It was then repeated for each facet joint nerve mentioned above. Appropriate radiographs were obtained to verify the probe placement during the neurotomy.   Additional Comments:  The patient tolerated the procedure well Dressing: 2 x 2 sterile gauze and Band-Aid    Post-procedure details: Patient was observed during the procedure. Post-procedure instructions were reviewed.  Patient left the clinic in stable condition.

## 2024-05-05 ENCOUNTER — Telehealth (HOSPITAL_COMMUNITY): Admitting: Psychiatry
